# Patient Record
Sex: Female | Born: 1950 | Race: Black or African American | Hispanic: No | Marital: Married | Smoking: Never smoker
Health system: Southern US, Community
[De-identification: ages and names within clinical notes are randomized; demographics above are authoritative.]

## PROBLEM LIST (undated history)

## (undated) DIAGNOSIS — G43909 Migraine, unspecified, not intractable, without status migrainosus: Secondary | ICD-10-CM

## (undated) DIAGNOSIS — T4145XA Adverse effect of unspecified anesthetic, initial encounter: Secondary | ICD-10-CM

## (undated) DIAGNOSIS — G459 Transient cerebral ischemic attack, unspecified: Secondary | ICD-10-CM

## (undated) DIAGNOSIS — I10 Essential (primary) hypertension: Secondary | ICD-10-CM

## (undated) DIAGNOSIS — M1990 Unspecified osteoarthritis, unspecified site: Secondary | ICD-10-CM

## (undated) DIAGNOSIS — G4730 Sleep apnea, unspecified: Secondary | ICD-10-CM

## (undated) DIAGNOSIS — Z860100 Personal history of colon polyps, unspecified: Secondary | ICD-10-CM

## (undated) DIAGNOSIS — T8859XA Other complications of anesthesia, initial encounter: Secondary | ICD-10-CM

## (undated) DIAGNOSIS — E119 Type 2 diabetes mellitus without complications: Secondary | ICD-10-CM

## (undated) DIAGNOSIS — K219 Gastro-esophageal reflux disease without esophagitis: Secondary | ICD-10-CM

## (undated) DIAGNOSIS — R569 Unspecified convulsions: Secondary | ICD-10-CM

## (undated) DIAGNOSIS — M2550 Pain in unspecified joint: Secondary | ICD-10-CM

## (undated) DIAGNOSIS — J189 Pneumonia, unspecified organism: Secondary | ICD-10-CM

## (undated) DIAGNOSIS — R42 Dizziness and giddiness: Secondary | ICD-10-CM

## (undated) DIAGNOSIS — J309 Allergic rhinitis, unspecified: Secondary | ICD-10-CM

## (undated) DIAGNOSIS — F419 Anxiety disorder, unspecified: Secondary | ICD-10-CM

## (undated) DIAGNOSIS — R0602 Shortness of breath: Secondary | ICD-10-CM

## (undated) DIAGNOSIS — H269 Unspecified cataract: Secondary | ICD-10-CM

## (undated) DIAGNOSIS — M2540 Effusion, unspecified joint: Secondary | ICD-10-CM

## (undated) DIAGNOSIS — E7800 Pure hypercholesterolemia, unspecified: Secondary | ICD-10-CM

## (undated) DIAGNOSIS — I509 Heart failure, unspecified: Secondary | ICD-10-CM

## (undated) DIAGNOSIS — M51369 Other intervertebral disc degeneration, lumbar region without mention of lumbar back pain or lower extremity pain: Secondary | ICD-10-CM

## (undated) DIAGNOSIS — I639 Cerebral infarction, unspecified: Secondary | ICD-10-CM

## (undated) DIAGNOSIS — Z8669 Personal history of other diseases of the nervous system and sense organs: Secondary | ICD-10-CM

## (undated) DIAGNOSIS — M5136 Other intervertebral disc degeneration, lumbar region: Secondary | ICD-10-CM

## (undated) HISTORY — DX: Cerebral infarction, unspecified: I63.9

## (undated) HISTORY — DX: Transient cerebral ischemic attack, unspecified: G45.9

## (undated) HISTORY — PX: TOTAL KNEE ARTHROPLASTY: SHX125

## (undated) HISTORY — DX: Essential (primary) hypertension: I10

## (undated) HISTORY — PX: ESOPHAGOGASTRODUODENOSCOPY: SHX1529

## (undated) HISTORY — DX: Allergic rhinitis, unspecified: J30.9

## (undated) HISTORY — DX: Type 2 diabetes mellitus without complications: E11.9

## (undated) HISTORY — PX: ROTATOR CUFF REPAIR: SHX139

## (undated) HISTORY — PX: COLONOSCOPY: SHX174

## (undated) HISTORY — DX: Pure hypercholesterolemia, unspecified: E78.00

## (undated) HISTORY — PX: EYE SURGERY: SHX253

## (undated) HISTORY — DX: Migraine, unspecified, not intractable, without status migrainosus: G43.909

## (undated) HISTORY — DX: Gastro-esophageal reflux disease without esophagitis: K21.9

---

## 1985-05-02 HISTORY — PX: HERNIA REPAIR: SHX51

## 1986-05-02 HISTORY — PX: OTHER SURGICAL HISTORY: SHX169

## 1989-05-02 HISTORY — PX: ABDOMINAL HYSTERECTOMY: SHX81

## 1994-05-02 HISTORY — PX: OTHER SURGICAL HISTORY: SHX169

## 1998-02-18 ENCOUNTER — Other Ambulatory Visit: Admission: RE | Admit: 1998-02-18 | Discharge: 1998-02-18 | Payer: Self-pay | Admitting: Obstetrics and Gynecology

## 1999-04-30 ENCOUNTER — Ambulatory Visit (HOSPITAL_COMMUNITY): Admission: RE | Admit: 1999-04-30 | Discharge: 1999-04-30 | Payer: Self-pay | Admitting: Obstetrics and Gynecology

## 1999-04-30 ENCOUNTER — Encounter (INDEPENDENT_AMBULATORY_CARE_PROVIDER_SITE_OTHER): Payer: Self-pay | Admitting: *Deleted

## 1999-12-07 ENCOUNTER — Encounter: Admission: RE | Admit: 1999-12-07 | Discharge: 1999-12-07 | Payer: Self-pay | Admitting: Neurosurgery

## 1999-12-07 ENCOUNTER — Encounter: Payer: Self-pay | Admitting: Neurosurgery

## 2001-03-12 ENCOUNTER — Other Ambulatory Visit: Admission: RE | Admit: 2001-03-12 | Discharge: 2001-03-12 | Payer: Self-pay | Admitting: Obstetrics and Gynecology

## 2001-05-30 ENCOUNTER — Ambulatory Visit (HOSPITAL_COMMUNITY): Admission: RE | Admit: 2001-05-30 | Discharge: 2001-05-30 | Payer: Self-pay | Admitting: Gastroenterology

## 2001-05-30 ENCOUNTER — Encounter (INDEPENDENT_AMBULATORY_CARE_PROVIDER_SITE_OTHER): Payer: Self-pay | Admitting: Specialist

## 2002-06-01 ENCOUNTER — Inpatient Hospital Stay (HOSPITAL_COMMUNITY): Admission: EM | Admit: 2002-06-01 | Discharge: 2002-06-02 | Payer: Self-pay | Admitting: Emergency Medicine

## 2002-09-26 ENCOUNTER — Emergency Department (HOSPITAL_COMMUNITY): Admission: EM | Admit: 2002-09-26 | Discharge: 2002-09-26 | Payer: Self-pay | Admitting: Emergency Medicine

## 2003-05-09 ENCOUNTER — Other Ambulatory Visit: Admission: RE | Admit: 2003-05-09 | Discharge: 2003-05-09 | Payer: Self-pay | Admitting: Obstetrics and Gynecology

## 2005-09-20 ENCOUNTER — Ambulatory Visit (HOSPITAL_COMMUNITY): Admission: RE | Admit: 2005-09-20 | Discharge: 2005-09-20 | Payer: Self-pay | Admitting: *Deleted

## 2006-06-17 ENCOUNTER — Emergency Department: Payer: Self-pay | Admitting: Emergency Medicine

## 2006-07-03 ENCOUNTER — Ambulatory Visit: Payer: Self-pay | Admitting: Cardiovascular Disease

## 2007-10-30 ENCOUNTER — Encounter (INDEPENDENT_AMBULATORY_CARE_PROVIDER_SITE_OTHER): Payer: Self-pay | Admitting: Family Medicine

## 2007-10-30 ENCOUNTER — Ambulatory Visit: Admission: RE | Admit: 2007-10-30 | Discharge: 2007-10-30 | Payer: Self-pay | Admitting: Family Medicine

## 2007-10-30 ENCOUNTER — Ambulatory Visit: Payer: Self-pay | Admitting: Vascular Surgery

## 2008-06-30 ENCOUNTER — Emergency Department (HOSPITAL_COMMUNITY): Admission: EM | Admit: 2008-06-30 | Discharge: 2008-06-30 | Payer: Self-pay | Admitting: Emergency Medicine

## 2009-05-02 HISTORY — PX: KNEE SURGERY: SHX244

## 2010-08-12 LAB — COMPREHENSIVE METABOLIC PANEL
ALT: 48 U/L — ABNORMAL HIGH (ref 0–35)
AST: 40 U/L — ABNORMAL HIGH (ref 0–37)
Albumin: 4 g/dL (ref 3.5–5.2)
Alkaline Phosphatase: 72 U/L (ref 39–117)
BUN: 18 mg/dL (ref 6–23)
CO2: 31 mEq/L (ref 19–32)
Calcium: 9.2 mg/dL (ref 8.4–10.5)
Chloride: 102 mEq/L (ref 96–112)
Creatinine, Ser: 0.96 mg/dL (ref 0.4–1.2)
GFR calc Af Amer: >60 mL/min (ref >60)
GFR calc non Af Amer: 60 mL/min — ABNORMAL LOW (ref >60)
Glucose, Bld: 122 mg/dL — ABNORMAL HIGH (ref 70–99)
Potassium: 3.4 mEq/L — ABNORMAL LOW (ref 3.5–5.1)
Sodium: 139 mEq/L (ref 135–145)
Total Bilirubin: 1.1 mg/dL (ref 0.3–1.2)
Total Protein: 7.4 g/dL (ref 6.0–8.3)

## 2010-08-12 LAB — CBC
HCT: 40.2 % (ref 36.0–46.0)
Hemoglobin: 13.8 g/dL (ref 12.0–15.0)
MCHC: 34.4 g/dL (ref 30.0–36.0)
MCV: 87.4 fL (ref 78.0–100.0)
Platelets: 259 10*3/uL (ref 150–400)
RBC: 4.6 MIL/uL (ref 3.87–5.11)
RDW: 14.5 % (ref 11.5–15.5)
WBC: 4.8 10*3/uL (ref 4.0–10.5)

## 2010-08-12 LAB — DIFFERENTIAL
Basophils Absolute: 0 10*3/uL (ref 0.0–0.1)
Basophils Relative: 0 % (ref 0–1)
Eosinophils Absolute: 0 10*3/uL (ref 0.0–0.7)
Eosinophils Relative: 0 % (ref 0–5)
Lymphocytes Relative: 6 % — ABNORMAL LOW (ref 12–46)
Lymphs Abs: 0.3 10*3/uL — ABNORMAL LOW (ref 0.7–4.0)
Monocytes Absolute: 0.2 10*3/uL (ref 0.1–1.0)
Monocytes Relative: 3 % (ref 3–12)
Neutro Abs: 4.3 10*3/uL (ref 1.7–7.7)
Neutrophils Relative %: 91 % — ABNORMAL HIGH (ref 43–77)

## 2010-08-12 LAB — URINE CULTURE: Colony Count: 35000

## 2010-09-17 NOTE — Assessment & Plan Note (Signed)
[facility]                               LIPID CLINIC NOTE   Robyn Aguilar, Robyn Aguilar             MRN:          [id number]  DATE:07/03/2006                            DOB:          1950-08-03    HISTORY OF PRESENT ILLNESS:  Robyn Aguilar is a pleasant patient  referred to the lipid clinic by Dr. Laurann Montana for evaluation and  medication titration associated with her hyperlipidemia.  This pleasant  60 year old female patient states upon arrival to the office that she  fears statin use and thought for a significant period of time about  canceling this appointment on Friday.  She came to hear what our  recommendation was but is not interested in taking medications at this  time.  The patient states in medication history review that she began to  have muscle ache and pain within 2 weeks after initiating Crestor,  Lipitor and Pravastatin over the past several years.  She states that  she has a very significant fear of use of statin medications.  In  review, she states that she has had no exposure to ubiquinone or CoQ10  during these trials with subsequent muscle pain.  The patient states  very proudly that she can get her total cholesterol down to 220 when she  is exercising and paying attention to her diet.  She had been using the  [facility] diet to use weight for several weeks prior to her last  cholesterol draw.  She proudly states that she lost 12 pounds within 2  weeks; however, she states that this diet probably did increase her  cholesterol because each day she was to have one to two eggs.  The  patient has a negative family history for heart disease per her report.  She has four half-siblings - one half-brother and three half-sisters who  are healthy.  Her father is deceased of unknown causes.  Her mother is  alive with hypertension.  She does have a coronary equivalent as she has  had a history of TIA approximately 4 years ago  resulting in slurred  speech, which resolved.   SOCIAL HISTORY:  She is working in the [employer] school system now  in [employer]  She lives at home.  She is married and she  and her husband live in the [place] area.   PAST MEDICAL HISTORY:  Pertinent for;  1. Hypertension.  2. Hypercholesterolemia.  3. Gastroesophageal reflux disease.  4. Allergic rhinitis.  5. TIA.  6. Migraine.   PAST SURGICAL HISTORY:  Pertinent for:  1. A growth removed from her buttocks; I do not have record of this.  2. Hysterectomy without oophorectomy for fibroids.  3. Right carpal tunnel syndrome surgery.  4. Bilateral hernia repair.  5. Surgical fusion.   The patient has, at the end of the visit, told me that she has had a  heart catheterization performed by Dr. Fraser Din.  I do not have records  of this at this time.  I will request these.   CURRENT MEDICATIONS:  As obtained in our visit include:  1. Estratest H.S. one tablet  daily at bedtime as needed.  2. Aspirin 81 mg one tablet daily.  3. Vitamin E 400 international units daily.  4. Multivitamin daily.  5. Fish oil one capsule daily.  6. Prinizide 20/25 one tablet daily.  7. AcipHex 20 mg daily.   REVIEW OF SYSTEMS:  As is stated in the HPI and otherwise negative.   PHYSICAL EXAMINATION:  Weight today in the office is 154 pounds.  Blood  pressure is 120/78, heart rate is 68.   Labs obtained at Dr. Lucilla Lame office on June 27, 2006, reveal normal  LFTs, a total cholesterol of 347, directly measured LDL of 244, TSH in  limits at 1.17, triglycerides 136, HDL cholesterol 63.   ASSESSMENT:  After a pleasant visit with the patient and review of  outstanding overall diet therapy, it is evident that Mrs. Raul Del-  Pettiford very strongly believes that medications are not indicated at  this time due to in part her belief that exercise is enough, and in part  due to her fear of the HMG-CoA reductase inhibitors.  I spent 50  minutes  with the patient and reviewed the plan of care with Dr. Rollene Rotunda,  who is in agreement with plan as follows:   1. Therapeutic lifestyle changes.  I have recommended to Mrs. Raul Del-      Pettiford that certainly she is well aware of the benefits of      exercise including stress reduction, improved sleep, and reduction      is cholesterol panels.  The patient agrees to get back to using her      [place]elle 30 minutes a day and walking between now and our next      visit.  2. Diet.  The patient will continue to work on dietary intake changes      including reduction in saturated fats and food that are high in      cholesterol.  She has not been eating red meat.  She will continue      this and she will work to continue to select lean cuts of meats and      increase her vegetable and fruit intake.  3. Time frame for therapeutic lifestyle changes alone will be 6 weeks.      I have had a complete discussion with the patient regarding the use      of non-statin-drug therapy to reduce the patient's overall risks      associated with her elevated cholesterol.  The patient reluctantly      appears accepting of Welchol six tablets daily as a nonabsorb      therapy, safe for use in pregnancy, as a potential option.   The patient will call with questions or problems in the meantime.  She  has not accepted a prescription for this agent today or samples.  The  plan is that we will have her check her blood work back in 6 weeks and  see her at that time.  If labs are not at goal we will then move forward  with this therapy.   I appreciate the opportunity to see this kind patient in clinic.  Thank  you for the referral.      Shelby Dubin, PharmD, BCPS, CPP  Electronically Signed      Rollene Rotunda, MD, [facility]  Electronically Signed   MP/MedQ  DD: 07/07/2006  DT: 07/07/2006  Job #: [id number]   cc:   Stacie Acres. Cliffton Asters, M.D.

## 2010-09-17 NOTE — Consult Note (Signed)
NAME:  Robyn Aguilar, Robyn Aguilar              ACCOUNT NO.:  [id number]   MEDICAL RECORD NO.:  [id number]                   PATIENT TYPE:  EMS   LOCATION:  MAJO                                 FACILITY:  [facility]   PHYSICIAN:  Melvyn Novas, M.D.               DATE OF BIRTH:  11/03/1950   DATE OF CONSULTATION:  09/26/2002  DATE OF DISCHARGE:                                   CONSULTATION   HISTORY OF PRESENT ILLNESS:  This lady presented to [facility] ER  after having suffered three days of severe headache, often awakening her in  the middle of the night, mostly left frontal, associated with left facial  numbness, and most pronounced in the maxillary area of her left face, and  with nausea.  The patient had been in general admission to the neurology  service for TIA and is not permitted to take triptans for migraine headache  due to this vascular risk factor.  While her previous admission was for  right-sided facial droop and numbness, she today presents with all symptoms  towards the left side of her face.   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  She is married.  No alcohol.  No nicotine  history.  No drug abuse history.   PAST MEDICAL HISTORY:  1. Hypertension.  2. Gastroesophageal reflux disease.  3. Cervical disk disease with spondylosis.  4. Migraine headache.  5. Transient ischemic attack.   MEDICATIONS:  Aspirin.  Allergy medication.  Maxzide.  Protonix.  Multivitamin.   ALLERGIES:  She is allergic to CODEINE and responds with severe nausea.   PHYSICAL EXAMINATION:  VITAL SIGNS:  Blood pressure 138/87, heart rate 72,  respiration rate 16, pulse peripherally were palpable, no clubbing, no  cyanosis, no edema.  Not febrile.  NECK:  No neck vein distention, no guarding, no swelling of the facial  structures.  The neck is supple.  There is no bruit.  NEUROLOGIC:  Mental status is alert and  oriented.  No aphasia, ataxia or  dysarthria.  Cranial nerves showed pupils reactive, equal to light and  accommodation.  Extraocular movements fully intact with fluent eye  movements, smooth pursuit in horizontal and vertical plane.  No papilledema  seen.  No facial droop is noticed.  Facial sensory over the right maxillary  area is described as numb, but there is also an area in the mandibular  distribution  close to the mouth that the patient describes as numb as well.  Not involved is, however, the forehead or the auricular region.  A  __________ test is negative, and not lateralized.  Tongue and uvula was  midline.  Motor exam showed equal strength and tone.  Muscle strength was  5/5 bilaterally.  The patient has slight elevation and tone, and this was  already noticed in January in a comparison, and attributed to the spinal  spondylosis.  There is no cogwheeling.  Finger, nose intact.  The deep  tendon reflexes are very brisk but downgoing toes to plantar stimulation, no  clonus.  Sensory is intact to primary and cortical modalities.    ASSESSMENT:  1. Migraine headaches, 345.81.  There is photophobia and nausea component.  2. History of cervical spondylosis with neck pain and cervicogenic headache.  3. Facial numbness either as a feature of migraine or unclear if a sinus     component.  The distribution is not typical for a central event, and a     TIA is therefore not likely.   PLAN:  1. Since the patient can not have triptans, we will treat her with IV     Reglan, steroids and non-narcotic pain medication.  2. Her blood pressure is within normal limits, and I feel safe to have her     discharged once her pain is decreased.  We might consider a different     migraine prevention medication such as topiramate or a beta-blocker for     the patient to prevent further exacerbations in the future.                                               Melvyn Novas, M.D.    CD/MEDQ   D:  09/26/2002  T:  09/27/2002  Job:  [id number]   cc:   [facility]   Stacie Acres. White, M.D.  [address]  [place]  [place] [address]  Fax: [phone]

## 2010-09-17 NOTE — Cardiovascular Report (Signed)
Robyn Aguilar, CAPRI NO.:  [id number]   MEDICAL RECORD NO.:  [id number]          PATIENT TYPE:  OIB   LOCATION:  2899                         FACILITY:  [facility]   PHYSICIAN:  Meade Maw, M.D.    DATE OF BIRTH:  1951-02-01   DATE OF PROCEDURE:  09/20/2005  DATE OF DISCHARGE:                              CARDIAC CATHETERIZATION   INDICATION FOR STUDY:  A 59 year old female with increasing dyspnea.   PROCEDURE:  After obtaining written informed consent the patient was brought  to the cardiac catheterization lab in the post absorptive state.  Preop  sedation was achieved using Versed 2 mg IV and Fentanyl 50 mcg IV.  The  right groin was prepped and draped in the usual sterile fashion.  Local  anesthesia was achieved using 1% Xylocaine.  A 6-French hemostasis sheath  was placed into the right femoral artery using the modified Seldinger  technique.  Selective coronary angiography was performed using a JL-4 and a  JR-4 nontorque catheter.  A single-plane ventriculogram was performed in the  RAO position using a 6-French pigtail curved catheter.  All catheter  exchanges were made over a guidewire.  Hemostasis sheath was flushed after  each catheter exchange.  There was no immediate complications.  There was no  identifiable coronary artery disease.  The patient was transferred to  holding area.  Hemostasis was achieved using digital pressure.   FINDINGS:  Aortic pressure is 158/96, LV pressure is 167/10.  EDP is 17.  Fluoroscopy reveals no significant calcification.  Single-plane  ventriculogram reveals normal wall motion ejection fraction of 65%.   CORONARY ANGIOGRAPHY:  Left main coronary artery bifurcates into the left  anterior descending and circumflex vessel.  There was no disease noted in  the left main coronary artery.   LEFT ANTERIOR DESCENDING:  Left anterior descending gives rise to an early D-  1 which bifurcates.  A D-2 goes on an as an apical branch.   There was no  disease in the LAD or its branches.   CIRCUMFLEX VESSEL:  Circumflex vessel is a large caliber vessel which gives  rise to a trivial OM-1, a large bifurcating OM-2 large OM-3 and thus a small  AV groove vessel.  There was no disease in the circumflex or its branches.   RIGHT CORONARY ARTERY:  Right coronary artery is a moderate size artery  dominant for the posterior circulation, gives rise to trivial RV marginals,  a large PDA and a moderate sized PL branch.  There is no disease noted in  the right coronary artery or its branches.   FINAL IMPRESSION:  Normal coronary angiography.  Normal single-plane  ventriculogram.  Stress test was felt to be a false positive.  Dyspnea may  be more related to deconditioning.  The patient does have a slight elevation  in her end-diastolic pressure suggesting some diastolic dysfunction.  Would  recommend aggressive control of her hypertension as well.      Meade Maw, M.D.  Electronically Signed     HP/MEDQ  D:  09/20/2005  T:  09/20/2005  Job:  [id number]   cc:  Stacie Acres Cliffton Asters, M.D.  Fax: [phone]

## 2010-09-17 NOTE — Assessment & Plan Note (Signed)
[facility]                               LIPID CLINIC NOTE   NAME:Robyn Aguilar, [place]             MRN:          [id number]  DATE:08/15/2006                            DOB:          06/16/1950    REFERRING PHYSICIAN:  Stacie Acres. Cliffton Asters, M.D.   A telephone call followup was made to [name] today, April  15 at 1:45 p.m. as she did not come to her lipid appointment yesterday  afternoon, in an attempt to reschedule that.  The patient stated at that  time that she would not be rescheduling and she will follow up with Dr.  Cliffton Asters regarding her lipids.  We will note this and discontinue followup  per her request.  We will be happy to see the patient at any other time  in the future as needed.      Shelby Dubin, PharmD, BCPS, CPP  Electronically Signed      Rollene Rotunda, MD, [facility]  Electronically Signed   MP/MedQ  DD: 08/15/2006  DT: 08/15/2006  Job #: [id number]   cc:   Stacie Acres. Cliffton Asters, M.D.

## 2010-09-17 NOTE — H&P (Signed)
NAME:  Robyn Aguilar, Robyn Aguilar                     ACCOUNT NO.:  [id number]   MEDICAL RECORD NO.:  [id number]                   PATIENT TYPE:  INP   LOCATION:  3035                                 FACILITY:  [facility]   PHYSICIAN:  Melvyn Novas, M.D.               DATE OF BIRTH:  16-Apr-1951   DATE OF ADMISSION:  05/31/2002  DATE OF DISCHARGE:                                HISTORY & PHYSICAL   REASON FOR ADMISSION:  Ms. Robyn Aguilar is admitted on June 01, 2002, to the  floor of [facility] after presenting to  the ER four hours ago with acute facial weakness and numbness.   HISTORY OF PRESENT ILLNESS:  Ms. Robyn Aguilar is a pleasant 60 year old right-  handed African-American female with a history of hypertension, cervical disk  disease, and surgery for disk herniations x2, history of migraine headaches  with visual aura and hypercholesterolemia.  She also has environmental  allergies.   The patient noticed today a sudden onset of right facial numbness at about 8  p.m. and a novocaine-like feeling in her right lower face that lasted four  hours and persisted here in the ER.  She states that she felt that her lower  face was swollen, almost like a dentist had numbed her teeth, and that she  also felt this was weaker or feeling heavier when she spoke.  The patient  did not notice any slurred speech or any problems with expressive or sensory  aphasia.  She also denies any dysphagia.  Seen in the ER by Dr. Read Drivers, he  noticed that there was significant right facial droop but also stated that  the forehead seemed not to be involved.  There is a decrease in the  nasolabial fold noticed over the right side, and a pulsating twitching lower  eyelid.  A finding that the patient herself states was developing as soon as  the numbness started to wear off.  She, however, has not fully recovered her  facial sensation nor her facial strength.   REVIEW OF SYSTEMS:   HEENT:  Eyelid twitching, facial numbness, facial  weakness.  The patient denies impairment in taste, visual acuity,  swallowing, word comprehension, hearing, and noticed no body related  findings such as headaches, nausea and vomiting.  She did blow her nose  right before she had the onset of sensory loss and had noticed some  nosebleed which raised her question if she could have had a hypertensive  episode.  There was no shortness of breath, fever or chills.  No sore  throat, and she received aspirin in the ER.  A CT was negative and she was  sent to the floor to the Neurologist.   MEDICATIONS:  1. Maxzide once a day.  2. Allegra seasonal once a day.  3. Flonase p.r.n.  4. Protonix 40 mg a day.  5. The patient takes multiple vitamins and Gingko Biloba but  she just     started yesterday, raising the question if this could be a possible     response to this nutritional supplement.   ALLERGIES:  She states she has no true medication allergies but is  intolerant of CODEINE due to nausea.   FAMILY HISTORY:  Hypertension, cholesterolemia, and obesity.   SOCIAL HISTORY:  The patient is married.  She works for the [employer].  She has one adult son.  She is gainfully employed, a Recruitment consultant.  Her husband and her son are both at the bedside.  She has never  smoked, she does not drink alcohol.  She has no history of any drug use.   PHYSICAL EXAMINATION:  VITAL SIGNS:  Left arm blood pressure in the ER was  noted at 146/80.  Here upon arrival on the floor the patient has a blood  pressure in the right arm of 124/81.  Repeat blood pressure measurements  left and right show a slight distance of 10 mm _______ in systolic blood  pressures.  Respiratory rate is 16, temperature is 98 Fahrenheit equivalent  to 36 degrees Celsius.  HEENT:  The mucous membranes are well perfused.  There is no sign of  infection or inflammatory changes.  No retinal abnormalities and no  icterus.  NECK:  I cannot detect carotid bruits, bilateral carotid pulses are palpable  as are peripheral pulses in general.  LUNGS:  The patient has clear lungs to auscultation.  HEART:  Regular rhythm and rate.  EXTREMITIES:  There is no edema, mottled skin, or cyanotic abnormalities.  No rash.  NEUROLOGIC EXAM:  Mental status alert and oriented x3.  Very calm, friendly,  and cooperative with a good sense of humor.  A very pleasant patient.  She  noticed no memory or concentration deficits and has shown no apraxia,  aphasia, or dysphagia signs.   Cranial nerves:  Pupils react equal to light and accommodation bilaterally,  direct and consensually.  She has full extraocular movements, conjugated eye  movements.  Denies any diplopia.  There is endpoint nystagmus for movement  in the horizontal plane to both sides with two to three beats which is in  normal limits.  No retinal abnormalities or papilledema are noticed.  Facial  symmetry is disturbed due to a persistent right facial droop and right lower  eyelid fluttering and twitching.  The patient's husband states that this is  already very much improved since she presented to the ER.  Tongue and uvula  are midline.  Neck and facial sensory are still impaired as the patient  reports a difference in sensation to fine touch over the right lower face,  the side of the neck, and radiating up to the mid shoulder.  Her hearing  appears intact and she does not lateralize in ______ test.   Motor:  Equal strength, tone and mass.  No pronator drift.  Deep tendon  reflexes:  1+ bilaterally  in the upper extremities for biceps and triceps,  and antebrachial reflex.  The patellar reflex is bilaterally equal at 1+.  There is an absent Achilles tendon reflex in the right.  The toes are  upgoing bilaterally upon repeat but I believe this is a withdrawal response  in a fairly ticklish person.  Sensory:  Body sensory remains equal except for the area  over neck and  shoulder that is described above.  The palms show ability to differentiate  shapes and objects.  She  has no upper or lower extremity numbness and no  trunk level to any modalities, cortical or primary.   Coordination:  Dysmetria is noticed.  Perhaps there is a touch of an ataxia  when she tries the finger-nose test in her left hand.  The patient resists  touching her nose and develops a very fine  tremor shortly before but she  does not have tremor at rest and no increased tone.  Again, no pronator  drift.  Gait:  Spontaneous gait is well balanced, narrow based, Romberg  negative.   ASSESSMENT:  The patient was admitted under the assumption that this could  be a Bell's palsy.  However, her forehead appears not involved.  In  addition, she has associated facial muscle, motor, and sensory findings, and  does also extend into the neck area making this more likely a trigeminal  involvement in the lower two branches with facial central involvement.  The  left-sided dysmetria as noted above could consist with the injury only if  this is a brainstem event and has affected a cerebellar peduncle or sensory  nucleus.   PLAN:  I will obtain an MRI with MRA of the head and neck today to rule out  any brainstem perfusion problem, and to also look at the posterior fossa.  If the MRI shows in diffusion weighted images no likelihood of any stroke, I  would like the patient still to continue aspirin as she has not taken this  at home before.  If the cervicocranial junction seems to be affected by  degenerative joint disease, I would give a six-day course of steroids.  If  we find evidence of stroke, it is most likely very small vascular stroke and  would still require an antiplatelet therapy as well as the  control of risk factors such as hypercholesterolemia and hypertension.  In  addition, the patient has stated that she had started on the Atkin's diet  and at the same time when she  started to take multiple vitamins.  I will  send for fasting lipid profile.                                               Melvyn Novas, M.D.    CD/MEDQ  D:  06/01/2002  T:  06/01/2002  Job:  [id number]

## 2011-05-12 ENCOUNTER — Other Ambulatory Visit: Payer: Self-pay | Admitting: Family Medicine

## 2011-05-12 DIAGNOSIS — G44009 Cluster headache syndrome, unspecified, not intractable: Secondary | ICD-10-CM

## 2011-05-18 ENCOUNTER — Ambulatory Visit
Admission: RE | Admit: 2011-05-18 | Discharge: 2011-05-18 | Disposition: A | Discharge status: Home / Self Care | Payer: BC Managed Care – PPO | Source: Ambulatory Visit | Attending: Family Medicine | Admitting: Family Medicine

## 2011-05-18 DIAGNOSIS — G44009 Cluster headache syndrome, unspecified, not intractable: Secondary | ICD-10-CM

## 2012-05-02 DIAGNOSIS — I509 Heart failure, unspecified: Secondary | ICD-10-CM

## 2012-05-02 HISTORY — DX: Heart failure, unspecified: I50.9

## 2012-08-25 ENCOUNTER — Emergency Department: Payer: Self-pay | Admitting: Emergency Medicine

## 2012-09-12 ENCOUNTER — Other Ambulatory Visit: Payer: Self-pay | Admitting: Neurology

## 2012-09-12 ENCOUNTER — Ambulatory Visit (INDEPENDENT_AMBULATORY_CARE_PROVIDER_SITE_OTHER): Payer: BC Managed Care – PPO | Admitting: Neurology

## 2012-09-12 ENCOUNTER — Other Ambulatory Visit: Payer: Self-pay | Admitting: *Deleted

## 2012-09-12 ENCOUNTER — Encounter: Payer: Self-pay | Admitting: Neurology

## 2012-09-12 VITALS — BP 141/95 | HR 71 | Temp 97.5°F | Ht 71.0 in | Wt 191.0 lb

## 2012-09-12 DIAGNOSIS — G2581 Restless legs syndrome: Secondary | ICD-10-CM

## 2012-09-12 DIAGNOSIS — G4713 Recurrent hypersomnia: Secondary | ICD-10-CM

## 2012-09-12 DIAGNOSIS — R400 Somnolence: Secondary | ICD-10-CM | POA: Insufficient documentation

## 2012-09-12 DIAGNOSIS — R0683 Snoring: Secondary | ICD-10-CM

## 2012-09-12 DIAGNOSIS — G4710 Hypersomnia, unspecified: Secondary | ICD-10-CM

## 2012-09-12 DIAGNOSIS — G43909 Migraine, unspecified, not intractable, without status migrainosus: Secondary | ICD-10-CM | POA: Insufficient documentation

## 2012-09-12 DIAGNOSIS — R404 Transient alteration of awareness: Secondary | ICD-10-CM

## 2012-09-12 MED ORDER — TOPIRAMATE 25 MG PO TABS: 25.0000 mg | ORAL_TABLET | Freq: Two times a day (BID) | ORAL | Status: DC

## 2012-09-12 MED ORDER — GABAPENTIN 100 MG PO CAPS: 100.0000 mg | ORAL_CAPSULE | Freq: Three times a day (TID) | ORAL | Status: DC

## 2012-09-12 NOTE — Progress Notes (Signed)
[facility]  Provider:  Dr Kumari Sculley Referring Provider: Cala Bradford, MD Primary Care Physician:  Cala Bradford, MD  Chief Complaint  Patient presents with  . New Evaluation    migraines, severe, rm 10    HPI:  Robyn Aguilar is a  right-handed, married, African American 62 y.o. female here as a referral from Dr. Cliffton Asters for intractable migraine. The patient reported that I saw her 10 years ago 2 in a hospital consult when she was admitted with a possible T. I A. on 09/26/2002 the my assessment at that time were neurologic complicated migraines, the patient had a complement of nausea and photophobia. She also had neck pain.  She is now referred by Dr. Cliffton Asters after she presented  With  migrainous headaches again. Dr. Cliffton Asters evaluated her in January 2013 for a cluster headache with sinusitis and this winter of for her migraines.  A CT of the maxillofacial skull was negative for sinusitis.  Her labs were normal. The only remarkable finding was an elevated cholesterol level, a low vitamin D level she had a very slightly decreased level of potassium at 3.4 ,and a slight decrease in her glomerular filtration rate. The patient developed again severe migraines and was seen on 07/20/2012. She had reported falling after the migraine cord" hit her and her eyes became watery and she couldn't see. She is to be picked up from work. She had good relief of frequent migraines by an ascending doors of topiramate every evening finally reaching 100 mg at night. For breakthrough pain she can take a Demerol per Dr. Cliffton Asters prescribed and she has not needed many of those for the last 6 months. She had some residual pain after the day of her fall in the right eye. She stated today that she had no other migraine attacks since March.   She has photophobia, nausea and some dizziness with severe migraines. She reports today that her migraines have again left off she is also coincidentally at this  time on prednisone which may have helped this and presumed allergic component to the migraine origin. Upon questioning her about side effects of topiramate may house the patient indicated that she has noted work finding difficulties. Tingling in the fingers was noted that was transient during the titration period I have asked her to see if she can get by the 75 mg at night instead of 100, and I will add 100 mg Neurontin at night to see if this will help her to sleep better at night  ,as she also complained of insomnia and restless movements.   Review of Systems: Out of a complete 14 system review, the patient complains of only the following symptoms, and all other reviewed systems are negative.   History   Social History  . Marital Status: Married    Spouse Name: N/A    Number of Children: 2  . Years of Education: college   Occupational History  .     Social History Main Topics  . Smoking status: Never Smoker   . Smokeless tobacco: Not on file  . Alcohol Use: No  . Drug Use: No  . Sexually Active: Not on file   Other Topics Concern  . Not on file   Social History Narrative  . No narrative on file    Family History  Problem Relation Age of Onset  . Prostate cancer Other     Past Medical History  Diagnosis Date  . Hypertension   .  Migraine   . High cholesterol   . Stroke     2 mini     Past Surgical History  Procedure Laterality Date  . Abdominal hysterectomy  1992  . Carpel tunnel  1980  . Knee surgery Left 2012    replacement  . Disc surgery  1980    Current Outpatient Prescriptions  Medication Sig Dispense Refill  . gabapentin (NEURONTIN) 100 MG capsule Take 1 capsule (100 mg total) by mouth 3 (three) times daily.  30 capsule  2  . meclizine (ANTIVERT) 25 MG tablet as needed.      [name] MEPERITAB 50 MG tablet Dr . Cliffton Asters . No refills.      . metoprolol succinate (TOPROL-XL) 100 MG 24 hr tablet       . predniSONE (DELTASONE) 10 MG tablet Allergy season,  Dr  Cliffton Asters.      . topiramate (TOPAMAX) 25 MG tablet Take 1 tablet (25 mg total) by mouth 2 (two) times daily.  120 tablet  3  . triamcinolone cream (KENALOG) 0.1 %       . triamterene-hydrochlorothiazide (MAXZIDE-25) 37.5-25 MG per tablet        No current facility-administered medications for this visit.    Allergies as of 09/12/2012  . (Not on File)    Vitals: BP 141/95  Pulse 71  Temp(Src) 97.5 F (36.4 C) (Oral)  Ht 5\' 11"  (1.803 m)  Wt 191 lb (86.637 kg)  BMI 26.65 kg/m2 Last Weight:  Wt Readings from Last 1 Encounters:  09/12/12 191 lb (86.637 kg)   Last Height:   Ht Readings from Last 1 Encounters:  09/12/12 5\' 11"  (1.803 m)   Vision Screening:  Vitals  Physical exam:  General: The patient is awake, alert and appears not in acute distress. The patient is well groomed. Head: Normocephalic, atraumatic. Neck is supple. Mallampati 3 neck circumference:14.5 . Cardiovascular:  Regular rate and rhythm, without  murmurs or carotid bruit, and without distended neck veins. Respiratory: Lungs are clear to auscultation. Skin:  Without evidence of edema, or rash The patient has an anterior scar from thyroid surgery on her neck.   Neurologic exam : The patient is awake and alert, oriented to place and time.  Memory subjective described as intact, yet she had trouble in cn conversation with finding words.   There is a normal attention span & concentration ability. Speech is fluent without dysarthria, dysphonia . Mood and affect are appropriate.  Cranial nerves: Pupils are equal and briskly reactive to light. Funduscopic exam without   evidence of pallor or edema. Extraocular movements  in vertical and horizontal planes intact but  With end point  nystagmus. Visual fields by finger perimetry are intact. Hearing to finger rub intact.  Facial sensation intact to fine touch. Facial motor strength is symmetric and tongue and uvula move midline.  Motor exam:   Normal tone and normal  muscle bulk and symmetric normal strength in all extremities.  Sensory:  Fine touch, pinprick and vibration were tested in all extremities. Proprioception is tested in the upper extremities only. This was  normal.  Coordination: Rapid alternating movements in the fingers/hands is tested and normal. Finger-to-nose maneuver tested and normal without evidence of ataxia, dysmetria or tremor.  Gait and station: Patient walks without assistive device and is able and assisted stool climb up to the exam table. Strength within normal limits. Stance is stable and normal.Steps are unfragmented.  Deep tendon reflexes: in the  upper and lower extremities  are  Present , her  left patella reflex was very brisk, she is status post knee replacement in NOV 2011.. Babinski maneuver response is downgoing.   Assessment:  After physical and neurologic examination, review of laboratory studies, imaging, neurophysiology testing and pre-existing records, assessment will be reviewed on the problem list.  Plan:  Treatment plan and additional workup will be reviewed under Problem List.   This patient has a ten-year history of neurologic migraines that appear not chronically but recurrent and paroxysmal. She is doing well in terms of migraine reduction on topiramate for tablets of 25 mg nightly. However work finding difficulties make it harder in her doctor function. I recommended a very slight reduction to 75 mg at night.  I will add 100 mg of Neurontin for headache prophylaxis as well as for the presumed restless leg syndrome.   I have a low index of suspicion for sleep apnea in this patient with a very slender neck, her husband reported that she snores but not nightly and only when very tired. And when she sits in supine position. I thought that the Neurontin will also work for the insomnia complement as well as helping with headache prophylaxis. I reviewed her lab reports from March of this year, and see no  contraindication.  The patient is sleepy during the days than she usually is, which could be attributed to medication, orders and nocturnal insomnia, or is snoring and Mallampati grade 3.a HST will be ordered to screen for apnea, whoch may contribute to migraines , too.

## 2012-09-12 NOTE — Patient Instructions (Addendum)
Migraine Headache A migraine headache is very bad, throbbing pain on one or both sides of your head. Talk to your doctor about what things may bring on (trigger) your migraine headaches. HOME CARE  Only take medicines as told by your doctor.  Lie down in a dark, quiet room when you have a migraine.  Keep a journal to find out if certain things bring on migraine headaches. For example, write down:  What you eat and drink.  How much sleep you get.  Any change to your diet or medicines.  Lessen how much alcohol you drink.  Quit smoking if you smoke.  Get enough sleep.  Lessen any stress in your life.  Keep lights dim if bright lights bother you or make your migraines worse. GET HELP RIGHT AWAY IF:   Your migraine becomes really bad.  You have a fever.  You have a stiff neck.  You have trouble seeing.  Your muscles are weak, or you lose muscle control.  You lose your balance or have trouble walking.  You feel like you will pass out (faint), or you pass out.  You have really bad symptoms that are different than your first symptoms. MAKE SURE YOU:   Understand these instructions.  Will watch your condition.  Will get help right away if you are not doing well or get worse. Document Released: 01/26/2008 Document Revised: 07/11/2011 Document Reviewed: 04/08/2011 [facility] Patient Information 2013 [place], [place]. Restless Legs Syndrome Restless legs syndrome is a movement disorder. It may also be called a sensori-motor disorder.  CAUSES  No one knows what specifically causes restless legs syndrome, but it tends to run in families. It is also more common in people with low iron, in pregnancy, in people who need dialysis, and those with nerve damage (neuropathy).Some medications may make restless legs syndrome worse.Those medications include drugs to treat high blood pressure, some heart conditions, nausea, colds, allergies, and depression. SYMPTOMS Symptoms include  uncomfortable sensations in the legs. These leg sensations are worse during periods of inactivity or rest. They are also worse while sitting or lying down. Individuals that have the disorder describe sensations in the legs that feel like:  Pulling.  Drawing.  Crawling.  Worming.  Boring.  Tingling.  Pins and needles.  Prickling.  Pain. The sensations are usually accompanied by an overwhelming urge to move the legs. Sudden muscle jerks may also occur. Movement provides temporary relief from the discomfort. In rare cases, the arms may also be affected. Symptoms may interfere with going to sleep (sleep onset insomnia). Restless legs syndrome may also be related to periodic limb movement disorder (PLMD). PLMD is another more common motor disorder. It also causes interrupted sleep. The symptoms from PLMD usually occur most often when you are awake. TREATMENT  Treatment for restless legs syndrome is symptomatic. This means that the symptoms are treated.   Massage and cold compresses may provide temporary relief.  Walk, stretch, or take a cold or hot bath.  Get regular exercise and a good night's sleep.  Avoid caffeine, alcohol, nicotine, and medications that can make it worse.  Do activities that provide mental stimulation like discussions, needlework, and video games. These may be helpful if you are not able to walk or stretch. Some medications are effective in relieving the symptoms. However, many of these medications have side effects. Ask your caregiver about medications that may help your symptoms. Correcting iron deficiency may improve symptoms for some patients. Document Released: 04/08/2002 Document Revised: 07/11/2011 Document  Reviewed: 07/15/2010 [employer] Patient Information 2013 [place], [place].

## 2012-09-28 ENCOUNTER — Ambulatory Visit (INDEPENDENT_AMBULATORY_CARE_PROVIDER_SITE_OTHER): Payer: BC Managed Care – PPO

## 2012-09-28 DIAGNOSIS — G2581 Restless legs syndrome: Secondary | ICD-10-CM

## 2012-09-28 DIAGNOSIS — G4733 Obstructive sleep apnea (adult) (pediatric): Secondary | ICD-10-CM

## 2012-09-28 DIAGNOSIS — R0683 Snoring: Secondary | ICD-10-CM

## 2012-09-28 DIAGNOSIS — G4710 Hypersomnia, unspecified: Secondary | ICD-10-CM

## 2012-10-11 ENCOUNTER — Telehealth: Payer: Self-pay | Admitting: *Deleted

## 2012-10-11 ENCOUNTER — Encounter: Payer: Self-pay | Admitting: *Deleted

## 2012-10-11 DIAGNOSIS — G4733 Obstructive sleep apnea (adult) (pediatric): Secondary | ICD-10-CM

## 2012-10-11 NOTE — Telephone Encounter (Signed)
Left message for patient regarding sleep study results, asked patient to call me back to discuss results and have questions answered.  Explained that a copy of the sleep study was sent to referring physician and copy of study is coming to them in the mail.  Left more detailed message on mobile number stating moderate OSA and CPAP Titration study was being recommended.  Asked pt to call me back to schedule and discuss results.

## 2012-10-11 NOTE — Telephone Encounter (Signed)
Spoke to patient regarding her sleep study results, went through results with her.  She understands need to return for CPAP Titration, appointment was scheduled for Friday 10/19/2012 at 9:30 PM - patient's husband will be staying with her that night.

## 2012-10-19 ENCOUNTER — Encounter: Payer: Self-pay | Admitting: Neurology

## 2012-10-26 ENCOUNTER — Ambulatory Visit (INDEPENDENT_AMBULATORY_CARE_PROVIDER_SITE_OTHER): Payer: BC Managed Care – PPO | Admitting: Neurology

## 2012-10-26 VITALS — BP 128/78 | HR 59 | Ht 61.0 in | Wt 191.0 lb

## 2012-10-26 DIAGNOSIS — G4733 Obstructive sleep apnea (adult) (pediatric): Secondary | ICD-10-CM

## 2012-11-06 ENCOUNTER — Telehealth: Payer: Self-pay | Admitting: Neurology

## 2012-11-06 DIAGNOSIS — G4733 Obstructive sleep apnea (adult) (pediatric): Secondary | ICD-10-CM

## 2012-11-06 NOTE — Telephone Encounter (Signed)
Patient needs a CPAP at 6 cm with nasal pillow airfit p10 , in small .  Needs Rv within the first 8 week on CPAP.  Left Vm for patient , so she knows a CPAP order is processed.

## 2012-11-07 ENCOUNTER — Encounter: Payer: Self-pay | Admitting: *Deleted

## 2012-11-07 NOTE — Telephone Encounter (Signed)
Orders forwarded to [facility] today.  Patient will receive a copy of sleep study report by mail along with a follow up appointment letter. -sh

## 2013-01-02 ENCOUNTER — Encounter: Payer: Self-pay | Admitting: Neurology

## 2013-01-08 DIAGNOSIS — M25519 Pain in unspecified shoulder: Secondary | ICD-10-CM | POA: Insufficient documentation

## 2013-01-22 ENCOUNTER — Encounter (HOSPITAL_COMMUNITY): Payer: Self-pay | Admitting: Respiratory Therapy

## 2013-01-23 ENCOUNTER — Other Ambulatory Visit: Payer: Self-pay | Admitting: Orthopedic Surgery

## 2013-01-23 ENCOUNTER — Ambulatory Visit: Payer: BC Managed Care – PPO | Admitting: Neurology

## 2013-01-24 NOTE — Pre-Procedure Instructions (Signed)
Robyn Aguilar  01/24/2013   Your procedure is scheduled on:  Mon, Oct 6 @ 8:45 AM  Report to [facility]  2 * 3 at 6:45 AM.  Call this number if you have problems the morning of surgery: [phone]   Remember:   Do not eat food or drink liquids after midnight.   Take these medicines the morning of surgery with A SIP OF WATER: Meclizine(Antiver-if needed),Metoprolol(Toprol),and Rabeprazole(Aciphex)               Stop taking your Aspirin.No Goody's,BC's,Aleve,Ibuprofen,Fish Oil,or any Herbal Medications   Do not wear jewelry, make-up or nail polish.  Do not wear lotions, powders, or perfumes. You may wear deodorant.  Do not shave 48 hours prior to surgery.   Do not bring valuables to the hospital.  [facility] is not responsible                  for any belongings or valuables.               Contacts, dentures or bridgework may not be worn into surgery.  Leave suitcase in the car. After surgery it may be brought to your room.  For patients admitted to the hospital, discharge time is determined by your                treatment team.               Special Instructions: Shower using CHG 2 nights before surgery and the night before surgery.  If you shower the day of surgery use CHG.  Use special wash - you have one bottle of CHG for all showers.  You should use approximately 1/3 of the bottle for each shower.   Please read over the following fact sheets that you were given: Pain Booklet, Coughing and Deep Breathing, Blood Transfusion Information, MRSA Information and Surgical Site Infection Prevention

## 2013-01-25 ENCOUNTER — Encounter (HOSPITAL_COMMUNITY): Payer: Self-pay

## 2013-01-25 ENCOUNTER — Encounter (HOSPITAL_COMMUNITY)
Admission: RE | Admit: 2013-01-25 | Discharge: 2013-01-25 | Disposition: A | Discharge status: Home / Self Care | Payer: BC Managed Care – PPO | Source: Ambulatory Visit | Attending: Orthopedic Surgery | Admitting: Orthopedic Surgery

## 2013-01-25 DIAGNOSIS — Z01812 Encounter for preprocedural laboratory examination: Secondary | ICD-10-CM | POA: Insufficient documentation

## 2013-01-25 DIAGNOSIS — Z01810 Encounter for preprocedural cardiovascular examination: Secondary | ICD-10-CM | POA: Insufficient documentation

## 2013-01-25 DIAGNOSIS — Z01818 Encounter for other preprocedural examination: Secondary | ICD-10-CM | POA: Insufficient documentation

## 2013-01-25 HISTORY — DX: Pneumonia, unspecified organism: J18.9

## 2013-01-25 HISTORY — DX: Dizziness and giddiness: R42

## 2013-01-25 HISTORY — DX: Unspecified convulsions: R56.9

## 2013-01-25 HISTORY — DX: Personal history of other diseases of the nervous system and sense organs: Z86.69

## 2013-01-25 HISTORY — DX: Unspecified cataract: H26.9

## 2013-01-25 HISTORY — DX: Pain in unspecified joint: M25.50

## 2013-01-25 HISTORY — DX: Personal history of colon polyps, unspecified: Z86.0100

## 2013-01-25 HISTORY — DX: Effusion, unspecified joint: M25.40

## 2013-01-25 HISTORY — DX: Unspecified osteoarthritis, unspecified site: M19.90

## 2013-01-25 HISTORY — DX: Sleep apnea, unspecified: G47.30

## 2013-01-25 HISTORY — DX: Personal history of colonic polyps: Z86.010

## 2013-01-25 LAB — COMPREHENSIVE METABOLIC PANEL
ALT: 32 U/L (ref 0–35)
AST: 32 U/L (ref 0–37)
Albumin: 4.3 g/dL (ref 3.5–5.2)
Alkaline Phosphatase: 80 U/L (ref 39–117)
BUN: 16 mg/dL (ref 6–23)
CO2: 27 mEq/L (ref 19–32)
Calcium: 10.1 mg/dL (ref 8.4–10.5)
Chloride: 100 mEq/L (ref 96–112)
Creatinine, Ser: 0.97 mg/dL (ref 0.50–1.10)
GFR calc Af Amer: 71 mL/min — ABNORMAL LOW (ref >90)
GFR calc non Af Amer: 61 mL/min — ABNORMAL LOW (ref >90)
Glucose, Bld: 100 mg/dL — ABNORMAL HIGH (ref 70–99)
Potassium: 3.4 mEq/L — ABNORMAL LOW (ref 3.5–5.1)
Sodium: 140 mEq/L (ref 135–145)
Total Bilirubin: 0.5 mg/dL (ref 0.3–1.2)
Total Protein: 7.8 g/dL (ref 6.0–8.3)

## 2013-01-25 LAB — URINALYSIS, ROUTINE W REFLEX MICROSCOPIC
Bilirubin Urine: NEGATIVE
Glucose, UA: NEGATIVE mg/dL
Hgb urine dipstick: NEGATIVE
Ketones, ur: NEGATIVE mg/dL
Leukocytes, UA: NEGATIVE
Nitrite: NEGATIVE
Protein, ur: NEGATIVE mg/dL
Specific Gravity, Urine: 1.011 (ref 1.005–1.030)
Urobilinogen, UA: 1 mg/dL (ref 0.0–1.0)
pH: 7.5 (ref 5.0–8.0)

## 2013-01-25 LAB — ABO/RH: ABO/RH(D): B POS

## 2013-01-25 LAB — CBC WITH DIFFERENTIAL/PLATELET
Basophils Absolute: 0 10*3/uL (ref 0.0–0.1)
Basophils Relative: 1 % (ref 0–1)
Eosinophils Absolute: 0.1 10*3/uL (ref 0.0–0.7)
Eosinophils Relative: 2 % (ref 0–5)
HCT: 43.3 % (ref 36.0–46.0)
Hemoglobin: 14.4 g/dL (ref 12.0–15.0)
Lymphocytes Relative: 44 % (ref 12–46)
Lymphs Abs: 1.5 10*3/uL (ref 0.7–4.0)
MCH: 27.7 pg (ref 26.0–34.0)
MCHC: 33.3 g/dL (ref 30.0–36.0)
MCV: 83.3 fL (ref 78.0–100.0)
Monocytes Absolute: 0.3 10*3/uL (ref 0.1–1.0)
Monocytes Relative: 10 % (ref 3–12)
Neutro Abs: 1.5 10*3/uL — ABNORMAL LOW (ref 1.7–7.7)
Neutrophils Relative %: 44 % (ref 43–77)
Platelets: 236 10*3/uL (ref 150–400)
RBC: 5.2 MIL/uL — ABNORMAL HIGH (ref 3.87–5.11)
RDW: 15.4 % (ref 11.5–15.5)
WBC: 3.4 10*3/uL — ABNORMAL LOW (ref 4.0–10.5)

## 2013-01-25 LAB — APTT: aPTT: 29 seconds (ref 24–37)

## 2013-01-25 LAB — PROTIME-INR
INR: 0.87 (ref 0.00–1.49)
Prothrombin Time: 11.7 seconds (ref 11.6–15.2)

## 2013-01-25 LAB — SURGICAL PCR SCREEN
MRSA, PCR: NEGATIVE
Staphylococcus aureus: NEGATIVE

## 2013-01-25 MED ORDER — CHLORHEXIDINE GLUCONATE 4 % EX LIQD: 60.0000 mL | Freq: Once | CUTANEOUS | Status: DC

## 2013-01-25 NOTE — Progress Notes (Addendum)
Saw Dr.Nancy Fraser Din in 2007 but no follow up required  Heart cath report in epic from 2007  Stress test and echo done early 2007  Dr.Cynthia White with [facility] is MEd[facility]  Denies EKG or CXR in past yr

## 2013-01-26 LAB — URINE CULTURE: Colony Count: 15000

## 2013-01-26 LAB — TYPE AND SCREEN
ABO/RH(D): B POS
Antibody Screen: NEGATIVE

## 2013-01-28 NOTE — Progress Notes (Signed)
Anesthesia Chart Review:  Patient is a 62 year old female scheduled for right TKA on 02/04/13 by Dr. Sherlean Foot.    History includes non-smoker, hypercholesterolemia, HTN, OSA, obesity, complex migraine versus TIA with normal MRI/MRA '04 (recent neurology follow-up with Dr. Vickey Huger 08/2012), medication related seizure 3 years ago, GERD, cataracts, hysterectomy, hernia repair. PCP is Dr. Laurann Montana.  EKG on 01/25/13 showed SB with sinus arrhythmia, LAD, cannot rule out anterior infarct (age undetermined).  It was not felt significantly changed from her previous tracing on 06/01/02.  Reportedly, she had an echo and abnormal stress test in 2007.  She subsequently underwent cardiac cath on 09/20/05 (Dr. Meade Maw) that showed normal coronaries, EF 65%. Report indicates that she did have slight elevation in her end-diastolic pressures suggesting some diastolic dysfunction.  Aggressive control of HTN was recommended.   Preoperative CXR and labs noted.  If no acute changes then I would anticipate that she could proceed as planned.  Velna Ochs [facility] Short Stay Center/Anesthesiology Phone [phone] 01/28/2013 3:28 PM

## 2013-02-03 MED ORDER — CEFAZOLIN SODIUM-DEXTROSE 2-3 GM-% IV SOLR
2.0000 g | INTRAVENOUS | Status: AC
  Administered 2013-02-04: 2 g via INTRAVENOUS
  Filled 2013-02-03: qty 50

## 2013-02-03 MED ORDER — SODIUM CHLORIDE 0.9 % IV SOLN: INTRAVENOUS | Status: DC

## 2013-02-03 MED ORDER — TRANEXAMIC ACID 100 MG/ML IV SOLN
1000.0000 mg | INTRAVENOUS | Status: AC
  Administered 2013-02-04: 1000 mg via INTRAVENOUS
  Filled 2013-02-03: qty 10

## 2013-02-04 ENCOUNTER — Encounter (HOSPITAL_COMMUNITY): Payer: Self-pay | Admitting: Anesthesiology

## 2013-02-04 ENCOUNTER — Encounter (HOSPITAL_COMMUNITY)
Admission: RE | Disposition: A | Discharge status: Home Health | Payer: Self-pay | Source: Ambulatory Visit | Attending: Orthopedic Surgery

## 2013-02-04 ENCOUNTER — Inpatient Hospital Stay (HOSPITAL_COMMUNITY)
Admission: RE | Admit: 2013-02-04 | Discharge: 2013-02-05 | DRG: 209 | Disposition: A | Discharge status: Home Health | Payer: BC Managed Care – PPO | Source: Ambulatory Visit | Attending: Orthopedic Surgery | Admitting: Orthopedic Surgery

## 2013-02-04 ENCOUNTER — Encounter (HOSPITAL_COMMUNITY): Payer: Self-pay | Admitting: Vascular Surgery

## 2013-02-04 ENCOUNTER — Inpatient Hospital Stay (HOSPITAL_COMMUNITY): Payer: BC Managed Care – PPO | Admitting: Anesthesiology

## 2013-02-04 DIAGNOSIS — Z8042 Family history of malignant neoplasm of prostate: Secondary | ICD-10-CM

## 2013-02-04 DIAGNOSIS — Z96651 Presence of right artificial knee joint: Secondary | ICD-10-CM

## 2013-02-04 DIAGNOSIS — Z860100 Personal history of colon polyps, unspecified: Secondary | ICD-10-CM

## 2013-02-04 DIAGNOSIS — K219 Gastro-esophageal reflux disease without esophagitis: Secondary | ICD-10-CM | POA: Diagnosis present

## 2013-02-04 DIAGNOSIS — D62 Acute posthemorrhagic anemia: Secondary | ICD-10-CM | POA: Diagnosis not present

## 2013-02-04 DIAGNOSIS — M1710 Unilateral primary osteoarthritis, unspecified knee: Principal | ICD-10-CM | POA: Diagnosis present

## 2013-02-04 DIAGNOSIS — E7800 Pure hypercholesterolemia, unspecified: Secondary | ICD-10-CM | POA: Diagnosis present

## 2013-02-04 DIAGNOSIS — G43909 Migraine, unspecified, not intractable, without status migrainosus: Secondary | ICD-10-CM | POA: Diagnosis present

## 2013-02-04 DIAGNOSIS — I10 Essential (primary) hypertension: Secondary | ICD-10-CM | POA: Diagnosis present

## 2013-02-04 DIAGNOSIS — G4730 Sleep apnea, unspecified: Secondary | ICD-10-CM | POA: Diagnosis present

## 2013-02-04 DIAGNOSIS — Z8673 Personal history of transient ischemic attack (TIA), and cerebral infarction without residual deficits: Secondary | ICD-10-CM

## 2013-02-04 DIAGNOSIS — H269 Unspecified cataract: Secondary | ICD-10-CM | POA: Diagnosis present

## 2013-02-04 HISTORY — PX: TOTAL KNEE ARTHROPLASTY: SHX125

## 2013-02-04 SURGERY — ARTHROPLASTY, KNEE, TOTAL
Anesthesia: General | Site: Knee | Laterality: Right | Wound class: Clean

## 2013-02-04 MED ORDER — FLEET ENEMA 7-19 GM/118ML RE ENEM: 1.0000 | ENEMA | Freq: Once | RECTAL | Status: AC | PRN

## 2013-02-04 MED ORDER — DIPHENHYDRAMINE HCL 12.5 MG/5ML PO ELIX: 12.5000 mg | ORAL_SOLUTION | ORAL | Status: DC | PRN

## 2013-02-04 MED ORDER — OXYCODONE HCL 5 MG PO TABS
5.0000 mg | ORAL_TABLET | ORAL | Status: DC | PRN
  Administered 2013-02-05 (×3): 10 mg via ORAL
  Filled 2013-02-04 (×4): qty 2

## 2013-02-04 MED ORDER — METOPROLOL SUCCINATE ER 100 MG PO TB24
100.0000 mg | ORAL_TABLET | Freq: Every day | ORAL | Status: DC
  Administered 2013-02-05: 100 mg via ORAL
  Filled 2013-02-04: qty 1

## 2013-02-04 MED ORDER — CELECOXIB 200 MG PO CAPS
200.0000 mg | ORAL_CAPSULE | Freq: Two times a day (BID) | ORAL | Status: DC
  Administered 2013-02-04 – 2013-02-05 (×3): 200 mg via ORAL
  Filled 2013-02-04 (×5): qty 1

## 2013-02-04 MED ORDER — PROPOFOL 10 MG/ML IV BOLUS
INTRAVENOUS | Status: DC | PRN
  Administered 2013-02-04: 150 mg via INTRAVENOUS

## 2013-02-04 MED ORDER — ONDANSETRON HCL 4 MG/2ML IJ SOLN: 4.0000 mg | Freq: Once | INTRAMUSCULAR | Status: DC | PRN

## 2013-02-04 MED ORDER — GLYCOPYRROLATE 0.2 MG/ML IJ SOLN
INTRAMUSCULAR | Status: DC | PRN
  Administered 2013-02-04: .8 mg via INTRAVENOUS
  Administered 2013-02-04: .2 mg via INTRAVENOUS

## 2013-02-04 MED ORDER — MEPERIDINE HCL 50 MG PO TABS: 50.0000 mg | ORAL_TABLET | ORAL | Status: DC | PRN

## 2013-02-04 MED ORDER — BUPIVACAINE HCL (PF) 0.5 % IJ SOLN
INTRAMUSCULAR | Status: DC | PRN
  Administered 2013-02-04: 10 mL via INTRA_ARTICULAR

## 2013-02-04 MED ORDER — BISACODYL 5 MG PO TBEC: 5.0000 mg | DELAYED_RELEASE_TABLET | Freq: Every day | ORAL | Status: DC | PRN

## 2013-02-04 MED ORDER — MECLIZINE HCL 25 MG PO TABS
25.0000 mg | ORAL_TABLET | ORAL | Status: DC | PRN
  Filled 2013-02-04: qty 1

## 2013-02-04 MED ORDER — LACTATED RINGERS IV SOLN
INTRAVENOUS | Status: DC
  Administered 2013-02-04: 50 mL/h via INTRAVENOUS

## 2013-02-04 MED ORDER — ONDANSETRON HCL 4 MG/2ML IJ SOLN
4.0000 mg | Freq: Four times a day (QID) | INTRAMUSCULAR | Status: DC | PRN
  Administered 2013-02-04 – 2013-02-05 (×3): 4 mg via INTRAVENOUS
  Filled 2013-02-04 (×4): qty 2

## 2013-02-04 MED ORDER — METHOCARBAMOL 500 MG PO TABS
500.0000 mg | ORAL_TABLET | Freq: Four times a day (QID) | ORAL | Status: DC | PRN
  Administered 2013-02-04 – 2013-02-05 (×4): 500 mg via ORAL
  Filled 2013-02-04 (×3): qty 1

## 2013-02-04 MED ORDER — PANTOPRAZOLE SODIUM 40 MG PO TBEC
40.0000 mg | DELAYED_RELEASE_TABLET | Freq: Every day | ORAL | Status: DC
  Filled 2013-02-04: qty 1

## 2013-02-04 MED ORDER — FENTANYL CITRATE 0.05 MG/ML IJ SOLN
INTRAMUSCULAR | Status: AC
  Filled 2013-02-04: qty 2

## 2013-02-04 MED ORDER — LIDOCAINE HCL 4 % MT SOLN
OROMUCOSAL | Status: DC | PRN
  Administered 2013-02-04: 4 mL via TOPICAL

## 2013-02-04 MED ORDER — SODIUM CHLORIDE 0.9 % IR SOLN
Status: DC | PRN
  Administered 2013-02-04: 1000 mL
  Administered 2013-02-04: 3000 mL

## 2013-02-04 MED ORDER — SODIUM CHLORIDE 0.9 % IV SOLN
INTRAVENOUS | Status: DC
  Administered 2013-02-04 – 2013-02-05 (×2): via INTRAVENOUS

## 2013-02-04 MED ORDER — METOCLOPRAMIDE HCL 5 MG PO TABS
5.0000 mg | ORAL_TABLET | Freq: Three times a day (TID) | ORAL | Status: DC | PRN
  Filled 2013-02-04: qty 2

## 2013-02-04 MED ORDER — FENTANYL CITRATE 0.05 MG/ML IJ SOLN
INTRAMUSCULAR | Status: DC | PRN
  Administered 2013-02-04 (×3): 50 ug via INTRAVENOUS
  Administered 2013-02-04: 25 ug via INTRAVENOUS
  Administered 2013-02-04: 50 ug via INTRAVENOUS
  Administered 2013-02-04: 25 ug via INTRAVENOUS
  Administered 2013-02-04 (×3): 50 ug via INTRAVENOUS

## 2013-02-04 MED ORDER — ROCURONIUM BROMIDE 100 MG/10ML IV SOLN
INTRAVENOUS | Status: DC | PRN
  Administered 2013-02-04: 50 mg via INTRAVENOUS

## 2013-02-04 MED ORDER — TOPIRAMATE 25 MG PO TABS
50.0000 mg | ORAL_TABLET | Freq: Every day | ORAL | Status: DC
  Filled 2013-02-04 (×2): qty 2

## 2013-02-04 MED ORDER — HYDROMORPHONE HCL PF 1 MG/ML IJ SOLN
1.0000 mg | INTRAMUSCULAR | Status: DC | PRN
  Administered 2013-02-04: 0.5 mg via INTRAVENOUS
  Administered 2013-02-04: 0.25 mg via INTRAVENOUS
  Administered 2013-02-04 (×2): 1 mg via INTRAVENOUS
  Filled 2013-02-04 (×3): qty 1

## 2013-02-04 MED ORDER — ARTIFICIAL TEARS OP OINT
TOPICAL_OINTMENT | OPHTHALMIC | Status: DC | PRN
  Administered 2013-02-04: 1 via OPHTHALMIC

## 2013-02-04 MED ORDER — LACTATED RINGERS IV SOLN
INTRAVENOUS | Status: DC | PRN
  Administered 2013-02-04 (×3): via INTRAVENOUS

## 2013-02-04 MED ORDER — MENTHOL 3 MG MT LOZG: 1.0000 | LOZENGE | OROMUCOSAL | Status: DC | PRN

## 2013-02-04 MED ORDER — ONDANSETRON HCL 4 MG PO TABS: 4.0000 mg | ORAL_TABLET | Freq: Four times a day (QID) | ORAL | Status: DC | PRN

## 2013-02-04 MED ORDER — DEXAMETHASONE SODIUM PHOSPHATE 10 MG/ML IJ SOLN
INTRAMUSCULAR | Status: DC | PRN
  Administered 2013-02-04: 8 mg via INTRAVENOUS

## 2013-02-04 MED ORDER — ACETAMINOPHEN 650 MG RE SUPP: 650.0000 mg | Freq: Four times a day (QID) | RECTAL | Status: DC | PRN

## 2013-02-04 MED ORDER — SODIUM CHLORIDE 0.9 % IJ SOLN
INTRAMUSCULAR | Status: DC | PRN
  Administered 2013-02-04: 10:00:00

## 2013-02-04 MED ORDER — ZOLPIDEM TARTRATE 5 MG PO TABS: 5.0000 mg | ORAL_TABLET | Freq: Every evening | ORAL | Status: DC | PRN

## 2013-02-04 MED ORDER — LIDOCAINE HCL (CARDIAC) 20 MG/ML IV SOLN
INTRAVENOUS | Status: DC | PRN
  Administered 2013-02-04: 100 mg via INTRAVENOUS

## 2013-02-04 MED ORDER — CEFAZOLIN SODIUM-DEXTROSE 2-3 GM-% IV SOLR
2.0000 g | Freq: Four times a day (QID) | INTRAVENOUS | Status: AC
  Administered 2013-02-04 (×2): 2 g via INTRAVENOUS
  Filled 2013-02-04 (×2): qty 50

## 2013-02-04 MED ORDER — BUPIVACAINE LIPOSOME 1.3 % IJ SUSP
20.0000 mL | Freq: Once | INTRAMUSCULAR | Status: DC
  Filled 2013-02-04 (×2): qty 20

## 2013-02-04 MED ORDER — METHOCARBAMOL 500 MG PO TABS
ORAL_TABLET | ORAL | Status: AC
  Filled 2013-02-04: qty 1

## 2013-02-04 MED ORDER — ACETAMINOPHEN 325 MG PO TABS: 650.0000 mg | ORAL_TABLET | Freq: Four times a day (QID) | ORAL | Status: DC | PRN

## 2013-02-04 MED ORDER — ALUM & MAG HYDROXIDE-SIMETH 200-200-20 MG/5ML PO SUSP: 30.0000 mL | ORAL | Status: DC | PRN

## 2013-02-04 MED ORDER — ONDANSETRON HCL 4 MG/2ML IJ SOLN
INTRAMUSCULAR | Status: DC | PRN
  Administered 2013-02-04: 4 mg via INTRAMUSCULAR
  Administered 2013-02-04: 4 mg via INTRAVENOUS

## 2013-02-04 MED ORDER — MIDAZOLAM HCL 5 MG/5ML IJ SOLN
INTRAMUSCULAR | Status: DC | PRN
  Administered 2013-02-04 (×2): 1 mg via INTRAVENOUS

## 2013-02-04 MED ORDER — NEOSTIGMINE METHYLSULFATE 1 MG/ML IJ SOLN
INTRAMUSCULAR | Status: DC | PRN
  Administered 2013-02-04: 5 mg via INTRAVENOUS

## 2013-02-04 MED ORDER — DOCUSATE SODIUM 100 MG PO CAPS
100.0000 mg | ORAL_CAPSULE | Freq: Two times a day (BID) | ORAL | Status: DC
  Administered 2013-02-04 – 2013-02-05 (×2): 100 mg via ORAL
  Filled 2013-02-04 (×3): qty 1

## 2013-02-04 MED ORDER — METOCLOPRAMIDE HCL 5 MG/ML IJ SOLN
5.0000 mg | Freq: Three times a day (TID) | INTRAMUSCULAR | Status: DC | PRN
  Administered 2013-02-05 (×2): 10 mg via INTRAVENOUS
  Filled 2013-02-04 (×2): qty 2

## 2013-02-04 MED ORDER — HYDROMORPHONE HCL PF 1 MG/ML IJ SOLN
0.2500 mg | INTRAMUSCULAR | Status: DC | PRN
  Administered 2013-02-04: 0.25 mg via INTRAVENOUS

## 2013-02-04 MED ORDER — TRIAMTERENE-HCTZ 37.5-25 MG PO TABS
1.0000 | ORAL_TABLET | Freq: Every day | ORAL | Status: DC
  Administered 2013-02-05: 1 via ORAL
  Filled 2013-02-04: qty 1

## 2013-02-04 MED ORDER — METHOCARBAMOL 100 MG/ML IJ SOLN
500.0000 mg | Freq: Four times a day (QID) | INTRAVENOUS | Status: DC | PRN
  Filled 2013-02-04: qty 5

## 2013-02-04 MED ORDER — PHENOL 1.4 % MT LIQD: 1.0000 | OROMUCOSAL | Status: DC | PRN

## 2013-02-04 MED ORDER — HYDROMORPHONE HCL PF 1 MG/ML IJ SOLN
INTRAMUSCULAR | Status: AC
  Filled 2013-02-04: qty 1

## 2013-02-04 MED ORDER — ENOXAPARIN SODIUM 30 MG/0.3ML ~~LOC~~ SOLN
30.0000 mg | Freq: Two times a day (BID) | SUBCUTANEOUS | Status: DC
  Administered 2013-02-05: 30 mg via SUBCUTANEOUS
  Filled 2013-02-04 (×3): qty 0.3

## 2013-02-04 MED ORDER — OXYCODONE HCL ER 10 MG PO T12A
10.0000 mg | EXTENDED_RELEASE_TABLET | Freq: Two times a day (BID) | ORAL | Status: DC
  Administered 2013-02-04 (×2): 10 mg via ORAL
  Filled 2013-02-04 (×2): qty 1

## 2013-02-04 MED ORDER — SENNOSIDES-DOCUSATE SODIUM 8.6-50 MG PO TABS: 1.0000 | ORAL_TABLET | Freq: Every evening | ORAL | Status: DC | PRN

## 2013-02-04 SURGICAL SUPPLY — 60 items
BANDAGE ESMARK 6X9 LF (GAUZE/BANDAGES/DRESSINGS) ×1 IMPLANT
BLADE SAGITTAL 13X1.27X60 (BLADE) ×2 IMPLANT
BLADE SAW SGTL 83.5X18.5 (BLADE) ×2 IMPLANT
BNDG ESMARK 6X9 LF (GAUZE/BANDAGES/DRESSINGS) ×2
BOWL SMART MIX CTS (DISPOSABLE) ×2 IMPLANT
CAP POR NKTM CP VIT E LN CER ×2 IMPLANT
CEMENT BONE SIMPLEX SPEEDSET (Cement) ×4 IMPLANT
CLOTH BEACON ORANGE TIMEOUT ST (SAFETY) ×2 IMPLANT
COVER SURGICAL LIGHT HANDLE (MISCELLANEOUS) ×2 IMPLANT
CUFF TOURNIQUET SINGLE 34IN LL (TOURNIQUET CUFF) ×2 IMPLANT
DRAPE EXTREMITY T 121X128X90 (DRAPE) ×2 IMPLANT
DRAPE INCISE IOBAN 66X45 STRL (DRAPES) ×4 IMPLANT
DRAPE PROXIMA HALF (DRAPES) ×2 IMPLANT
DRAPE U-SHAPE 47X51 STRL (DRAPES) ×2 IMPLANT
DRSG ADAPTIC 3X8 NADH LF (GAUZE/BANDAGES/DRESSINGS) ×2 IMPLANT
DRSG PAD ABDOMINAL 8X10 ST (GAUZE/BANDAGES/DRESSINGS) ×2 IMPLANT
DURAPREP 26ML APPLICATOR (WOUND CARE) ×4 IMPLANT
ELECT REM PT RETURN 9FT ADLT (ELECTROSURGICAL) ×2
ELECTRODE REM PT RTRN 9FT ADLT (ELECTROSURGICAL) ×1 IMPLANT
EVACUATOR 1/8 PVC DRAIN (DRAIN) ×2 IMPLANT
GLOVE BIOGEL M 7.0 STRL (GLOVE) IMPLANT
GLOVE BIOGEL PI IND STRL 6.5 (GLOVE) ×2 IMPLANT
GLOVE BIOGEL PI IND STRL 7.5 (GLOVE) IMPLANT
GLOVE BIOGEL PI IND STRL 8.5 (GLOVE) ×2 IMPLANT
GLOVE BIOGEL PI INDICATOR 6.5 (GLOVE) ×2
GLOVE BIOGEL PI INDICATOR 7.5 (GLOVE)
GLOVE BIOGEL PI INDICATOR 8.5 (GLOVE) ×2
GLOVE BIOGEL PI ORTHO PRO 7.5 (GLOVE) ×1
GLOVE PI ORTHO PRO STRL 7.5 (GLOVE) ×1 IMPLANT
GLOVE SURG ORTHO 8.0 STRL STRW (GLOVE) ×4 IMPLANT
GLOVE SURG SS PI 7.5 STRL IVOR (GLOVE) ×2 IMPLANT
GOWN PREVENTION PLUS XLARGE (GOWN DISPOSABLE) ×4 IMPLANT
GOWN STRL NON-REIN LRG LVL3 (GOWN DISPOSABLE) ×4 IMPLANT
HANDPIECE INTERPULSE COAX TIP (DISPOSABLE) ×1
HOOD PEEL AWAY FACE SHEILD DIS (HOOD) ×8 IMPLANT
KIT BASIN OR (CUSTOM PROCEDURE TRAY) ×2 IMPLANT
KIT ROOM TURNOVER OR (KITS) ×2 IMPLANT
MANIFOLD NEPTUNE II (INSTRUMENTS) ×2 IMPLANT
NEEDLE 22X1 1/2 (OR ONLY) (NEEDLE) ×2 IMPLANT
NEEDLE HYPO 21X1.5 SAFETY (NEEDLE) ×2 IMPLANT
NS IRRIG 1000ML POUR BTL (IV SOLUTION) ×2 IMPLANT
PACK TOTAL JOINT (CUSTOM PROCEDURE TRAY) ×2 IMPLANT
PAD ARMBOARD 7.5X6 YLW CONV (MISCELLANEOUS) ×4 IMPLANT
PADDING CAST COTTON 6X4 STRL (CAST SUPPLIES) ×2 IMPLANT
POSITIONER HEAD PRONE TRACH (MISCELLANEOUS) ×2 IMPLANT
SET HNDPC FAN SPRY TIP SCT (DISPOSABLE) ×1 IMPLANT
SPONGE GAUZE 4X4 12PLY (GAUZE/BANDAGES/DRESSINGS) ×2 IMPLANT
STAPLER VISISTAT 35W (STAPLE) ×2 IMPLANT
SUCTION FRAZIER TIP 10 FR DISP (SUCTIONS) ×2 IMPLANT
SUT BONE WAX W31G (SUTURE) ×2 IMPLANT
SUT VIC AB 0 CTB1 27 (SUTURE) ×4 IMPLANT
SUT VIC AB 1 CT1 27 (SUTURE) ×2
SUT VIC AB 1 CT1 27XBRD ANBCTR (SUTURE) ×2 IMPLANT
SUT VIC AB 2-0 CT1 27 (SUTURE) ×2
SUT VIC AB 2-0 CT1 TAPERPNT 27 (SUTURE) ×2 IMPLANT
SYR CONTROL 10ML LL (SYRINGE) ×2 IMPLANT
TOWEL OR 17X24 6PK STRL BLUE (TOWEL DISPOSABLE) ×2 IMPLANT
TOWEL OR 17X26 10 PK STRL BLUE (TOWEL DISPOSABLE) ×2 IMPLANT
TRAY FOLEY CATH 16FRSI W/METER (SET/KITS/TRAYS/PACK) ×2 IMPLANT
WATER STERILE IRR 1000ML POUR (IV SOLUTION) ×4 IMPLANT

## 2013-02-04 NOTE — Transfer of Care (Signed)
Immediate Anesthesia Transfer of Care Note  Patient: Robyn Aguilar  Procedure(s) Performed: Procedure(s): RIGHT TOTAL KNEE ARTHROPLASTY (Right)  Patient Location: PACU  Anesthesia Type:General  Level of Consciousness: oriented, sedated and patient cooperative  Airway & Oxygen Therapy: Patient Spontanous Breathing and Patient connected to face mask oxygen  Post-op Assessment: Report given to PACU RN, Post -op Vital signs reviewed and stable and Patient moving all extremities X 4  Post vital signs: Reviewed and stable  Complications: No apparent anesthesia complications

## 2013-02-04 NOTE — Progress Notes (Signed)
Orthopedic Tech Progress Note Patient Details:  Robyn Aguilar October 26, 1950 [id number]  CPM Right Knee CPM Right Knee: On Right Knee Flexion (Degrees): 90 Right Knee Extension (Degrees): 0 Additional Comments: Trapeze bar and foot roll   Cammer, Mickie Bail 02/04/2013, 1:00 PM

## 2013-02-04 NOTE — H&P (Signed)
Robyn Aguilar MRN:  [id number] DOB/SEX:  May 18, 1950/female  CHIEF COMPLAINT:  Painful right Knee  HISTORY: Patient is a 62 y.o. female presented with a history of pain in the right knee. Onset of symptoms was gradual starting several years ago with gradually worsening course since that time. Prior procedures on the knee include none. Patient has been treated conservatively with over-the-counter NSAIDs and activity modification. Patient currently rates pain in the knee at 9 out of 10 with activity. There is pain at night.  PAST MEDICAL HISTORY: Patient Active Problem List   Diagnosis Date Noted  . Migraine, unspecified, without mention of intractable migraine without mention of status migrainosus 09/12/2012  . Intermittent sleepiness 09/12/2012   Past Medical History  Diagnosis Date  . Migraine   . High cholesterol     but doesn't take any meds  . Allergic rhinitis   . TIA (transient ischemic attack)   . Migraines   . Hypertension     takes Metoprolol and maxzide daily  . Sleep apnea     sleep study in epic from 2014-uses CPAP  . Pneumonia     at age 53  . History of migraine     last one 6-51months ago;takes Topamax nightly  . Seizures     hx of;last one 80yrs ago;no meds required;states it was brought on by med;only one ever had  . Dizziness     takes Antivert prn  . Stroke     2 TIA  . Arthritis     knees and neck  . Joint pain   . Joint swelling   . GERD (gastroesophageal reflux disease)     takes Aciphex prn  . History of colon polyps   . Cataracts, bilateral     immature    Past Surgical History  Procedure Laterality Date  . Carpel tunnel Right 1988  . Knee surgery Left 2011    replacement  . Disc surgery  1996  . Hernia repair  1987  . Abdominal hysterectomy  1991  . Colonoscopy    . Esophagogastroduodenoscopy       MEDICATIONS:   No prescriptions prior to admission    ALLERGIES:   Allergies  Allergen Reactions  . Codeine     REVIEW  OF SYSTEMS:  Pertinent items are noted in HPI.   FAMILY HISTORY:   Family History  Problem Relation Age of Onset  . Prostate cancer Other     SOCIAL HISTORY:   History  Substance Use Topics  . Smoking status: Never Smoker   . Smokeless tobacco: Not on file  . Alcohol Use: No     EXAMINATION:  Vital signs in last 24 hours:    General appearance: alert, cooperative and no distress Lungs: clear to auscultation bilaterally Heart: regular rate and rhythm, S1, S2 normal, no murmur, click, rub or gallop Abdomen: soft, non-tender; bowel sounds normal; no masses,  no organomegaly Extremities: Homans sign is negative, no sign of DVT Pulses: 2+ and symmetric Neurologic: Alert and oriented X 3, normal strength and tone. Normal symmetric reflexes. Normal coordination and gait  Musculoskeletal:  ROM 0-100, Ligaments intact,  Imaging Review Plain radiographs demonstrate severe degenerative joint disease of the right knee. The overall alignment is mild valgus. The bone quality appears to be good for age and reported activity level.  Assessment/Plan: End stage arthritis, right knee   The patient history, physical examination and imaging studies are consistent with advanced degenerative joint disease of the right knee. The patient  has failed conservative treatment.  The clearance notes were reviewed.  After discussion with the patient it was felt that Total Knee Replacement was indicated. The procedure,  risks, and benefits of total knee arthroplasty were presented and reviewed. The risks including but not limited to aseptic loosening, infection, blood clots, vascular injury, stiffness, patella tracking problems complications among others were discussed. The patient acknowledged the explanation, agreed to proceed with the plan.  Dvonte Gatliff 02/04/2013, 6:30 AM

## 2013-02-04 NOTE — OR Nursing (Signed)
Pt's BBB resolved. MD and anesthesia aware.

## 2013-02-04 NOTE — Evaluation (Signed)
Physical Therapy Evaluation Patient Details Name: Robyn Aguilar MRN: [id number] DOB: 09-Feb-1951 Today's Date: 02/04/2013 Time: 7829-5621 PT Time Calculation (min): 34 min  PT Assessment / Plan / Recommendation History of Present Illness  62 y.o. female admitted to [facility] on 02/04/13 for elective TKA on the right.  She is WBAT.  No Brace.    Clinical Impression  Pt is POD #0 and is requiring two person assist to transfer to the chair for safety.  She is having more acute abdominal pain than she is reporting post-surgical knee pain.  RN made aware.  I believe that she will progress well with PT when more of her anesthesia and pain medicine is out of her system and I anticipate that HHPT will be needed at discharge.  She has good family support and her husband is physically able to provide assist at discharge.  The pt is moving well and took good weight on both legs during the transfer, she was just very groggy.   PT to follow acutely for deficits listed below.       PT Assessment  Patient needs continued PT services    Follow Up Recommendations  Home health PT;Supervision/Assistance - 24 hour    Does the patient have the potential to tolerate intense rehabilitation     Yes  Barriers to Discharge   None      Equipment Recommendations  None recommended by PT    Recommendations for Other Services   None  Frequency 7X/week    Precautions / Restrictions Precautions Precautions: Knee Restrictions Weight Bearing Restrictions: No RLE Weight Bearing: Weight bearing as tolerated Other Position/Activity Restrictions: no pillow under right knee   Pertinent Vitals/Pain See vitals flow sheet.      Mobility  Bed Mobility Bed Mobility: Supine to Sit;Sitting - Scoot to Edge of Bed Supine to Sit: 4: Min assist;With rails;HOB elevated Sitting - Scoot to [place] of Bed: 4: Min assist;With rail Details for Bed Mobility Assistance: min assist to help progress right leg over the side of the  bed.   Transfers Transfers: Sit to Stand;Stand to Sit;Stand Pivot Transfers Sit to Stand: 1: +2 Total assist;With upper extremity assist;From bed Sit to Stand: Patient Percentage: 70% Stand to Sit: 1: +2 Total assist;With upper extremity assist;With armrests;To chair/3-in-1 Stand to Sit: Patient Percentage: 70% Stand Pivot Transfers: 1: +2 Total assist;With armrests Stand Pivot Transfers: Patient Percentage: 70% Details for Transfer Assistance: Pt able to use upper extremities to support weight as well as putting most of her body weight through her left knee.  Eyes were closed much of the transfer, but she reported that she was able to bear some weight through her right knee and it did not buckle with 2 pivotal steps to the chair on her left side using the RW.  Husband on the other side of her for safety and to increase pt's comfort.   Ambulation/Gait Ambulation/Gait Assistance: Not tested (comment) (pt not alert enough to attempt gait at this time.  )        PT Diagnosis: Difficulty walking;Abnormality of gait;Generalized weakness;Acute pain  PT Problem List: Decreased strength;Decreased range of motion;Decreased activity tolerance;Decreased balance;Decreased mobility;Decreased knowledge of use of DME;Decreased knowledge of precautions;Pain PT Treatment Interventions: DME instruction;Gait training;Stair training;Functional mobility training;Therapeutic activities;Therapeutic exercise;Balance training;Neuromuscular re-education;Patient/family education;Modalities     PT Goals(Current goals can be found in the care plan section) Acute Rehab PT Goals Patient Stated Goal: "Let's do this.  I want to go home" PT Goal Formulation: With patient/family  Time For Goal Achievement: 02/11/13 Potential to Achieve Goals: Good  Visit Information  Last PT Received On: 02/04/13 Assistance Needed: +2 (for safety due to pt very groggy) History of Present Illness: 62 y.o. female admitted to [facility] on 02/04/13  for elective TKA on the right.  She is WBAT.  No Brace.         Prior Functioning  Home Living Family/patient expects to be discharged to:: Private residence Living Arrangements: Spouse/significant other Available Help at Discharge: Family;Available 24 hours/day Type of Home: House Home Access: Stairs to enter [employer] of Steps: 3 Entrance Stairs-Rails: None Home Layout: Two level;1/2 bath on main level Alternate Level Stairs-Number of Steps: flight Alternate Level Stairs-Rails:  (pt is planning on sleeping downstairs) Home Equipment: Walker - 2 wheels;Cane - quad;Bedside commode;Shower seat Additional Comments: pt has CPM at home as well.   Prior Function Level of Independence: Independent Comments: per husband she did not use assistive device PTA.   Communication Communication: No difficulties    Cognition  Cognition Arousal/Alertness: Lethargic;Suspect due to medications Behavior During Therapy: Flat affect Overall Cognitive Status: Difficult to assess Difficult to assess due to: Level of arousal (due to grogginess from pain meds/anesthesia.  )    Extremity/Trunk Assessment Upper Extremity Assessment Upper Extremity Assessment: Overall WFL for tasks assessed Lower Extremity Assessment Lower Extremity Assessment: RLE deficits/detail RLE Deficits / Details: normal post-surgical weakness.  Ankle 4/5, knee 2/5, hip 2+/5 Cervical / Trunk Assessment Cervical / Trunk Assessment: Normal   Balance Balance Balance Assessed: Yes Static Sitting Balance Static Sitting - Balance Support: Bilateral upper extremity supported;Feet supported Static Sitting - Level of Assistance: 5: Stand by assistance Static Standing Balance Static Standing - Balance Support: Bilateral upper extremity supported Static Standing - Level of Assistance: 1: +2 Total assist;Patient percentage (comment) (pt 70%) Dynamic Standing Balance Dynamic Standing - Balance Support: Bilateral upper  extremity supported Dynamic Standing - Level of Assistance: 1: +2 Total assist;Patient percentage (comment) (pt 70%)  End of Session PT - End of Session Equipment Utilized During Treatment: Gait belt Activity Tolerance: Patient limited by fatigue;Patient limited by pain;Patient limited by lethargy Patient left: in chair;with call bell/phone within reach;with family/visitor present Nurse Communication: Mobility status;Other (comment) (sharp pain in R abdomen, +2 needed for safety) CPM Right Knee CPM Right Knee: Off    Breelyn Icard B. Alton Bouknight, PT, DPT [phone]   02/04/2013, 5:49 PM

## 2013-02-04 NOTE — Anesthesia Postprocedure Evaluation (Signed)
  Anesthesia Post-op Note  Patient: Robyn Aguilar  Procedure(s) Performed: Procedure(s): RIGHT TOTAL KNEE ARTHROPLASTY (Right)  Patient Location: PACU  Anesthesia Type:General and GA combined with regional for post-op pain  Level of Consciousness: awake, alert , oriented and patient cooperative  Airway and Oxygen Therapy: Patient Spontanous Breathing  Post-op Pain: mild  Post-op Assessment: Post-op Vital signs reviewed, Patient's Cardiovascular Status Stable, Respiratory Function Stable, Patent Airway, No signs of Nausea or vomiting and Pain level controlled  Post-op Vital Signs: stable  Complications: No apparent anesthesia complications

## 2013-02-04 NOTE — Anesthesia Procedure Notes (Addendum)
Anesthesia Regional Block:  Femoral nerve block  Pre-Anesthetic Checklist: ,, timeout performed, Correct Patient, Correct Site, Correct Laterality, Correct Procedure, Correct Position, site marked, Risks and benefits discussed,  Surgical consent,  Pre-op evaluation,  At surgeon's request and post-op pain management  Laterality: Right  Prep: chloraprep and alcohol swabs       Needles:  Injection technique: Single-shot  Needle Type: Stimulator Needle - 80        Needle insertion depth: 4 cm   Additional Needles:  Procedures: nerve stimulator Femoral nerve block  Nerve Stimulator or Paresthesia:  Response: 0.5 mA, 0.1 ms, 4 cm  Additional Responses:   Narrative:  Start time: 02/04/2013 8:30 AM End time: 02/04/2013 8:35 AM Injection made incrementally with aspirations every 5 mL.  Performed by: Personally  Anesthesiologist: Maren Beach MD  Additional Notes: Pt accepts procedure and risks. 12cc 0.5% Marcaine w/o epi w/o difficulty or discomfort. Pt tolerated well. GES   Procedure Name: Intubation Date/Time: 02/04/2013 8:51 AM Performed by: Sherie Don Pre-anesthesia Checklist: Patient identified, Emergency Drugs available, Suction available, Patient being monitored and Timeout performed Patient Re-evaluated:Patient Re-evaluated prior to inductionOxygen Delivery Method: Circle system utilized Preoxygenation: Pre-oxygenation with 100% oxygen Intubation Type: IV induction Ventilation: Mask ventilation without difficulty Laryngoscope Size: Mac and 3 Grade View: Grade II Tube size: 7.0 mm Number of attempts: 1 Airway Equipment and Method: Stylet and LTA kit utilized Placement Confirmation: ETT inserted through vocal cords under direct vision,  positive ETCO2 and breath sounds checked- equal and bilateral Secured at: 23 cm Tube secured with: Tape Dental Injury: Teeth and Oropharynx as per pre-operative assessment

## 2013-02-04 NOTE — Anesthesia Preprocedure Evaluation (Signed)
Anesthesia Evaluation  Patient identified by MRN, date of birth, ID band Patient awake    Reviewed: Allergy & Precautions, H&P , NPO status , Patient's Chart, lab work & pertinent test results  Airway Mallampati: I TM Distance: >3 FB Neck ROM: full    Dental   Pulmonary sleep apnea , COPD         Cardiovascular hypertension, Rhythm:regular Rate:Normal     Neuro/Psych  Headaches, Seizures -,  TIACVA    GI/Hepatic GERD-  ,  Endo/Other    Renal/GU      Musculoskeletal   Abdominal   Peds  Hematology   Anesthesia Other Findings   Reproductive/Obstetrics                           Anesthesia Physical Anesthesia Plan  ASA: III  Anesthesia Plan: General   Post-op Pain Management:    Induction: Intravenous  Airway Management Planned: Oral ETT  Additional Equipment:   Intra-op Plan:   Post-operative Plan: Extubation in OR  Informed Consent: I have reviewed the patients History and Physical, chart, labs and discussed the procedure including the risks, benefits and alternatives for the proposed anesthesia with the patient or authorized representative who has indicated his/her understanding and acceptance.     Plan Discussed with: CRNA, Anesthesiologist and Surgeon  Anesthesia Plan Comments:         Anesthesia Quick Evaluation

## 2013-02-04 NOTE — Progress Notes (Signed)
Respiratory Therapy  Had CPAP with Auto setting (pt thought her setting was 6cm but not sure) and nasal mask. Patient was going to wear it with her nasal pillows. She said she was only staying one night and asked if she was going to be charged for the use of the CPAP machine. I told her yes. She said she didn't want it. She also said she would sleep with the head of the bed elevated and if she needs to be here for more than one night she will bring hers from home.

## 2013-02-04 NOTE — OR Nursing (Signed)
Pt w/ BBB appearing EKG, differing from what had been observed by CRNA in OR. Anesthesia and MD informed. Ortho PA and Anesthesia at bs. No symptom of instability. Continue to observe.

## 2013-02-04 NOTE — Preoperative (Signed)
Beta Blockers   Reason not to administer Beta Blockers:Toprol tkaen 02/04/13

## 2013-02-05 ENCOUNTER — Other Ambulatory Visit: Payer: Self-pay

## 2013-02-05 ENCOUNTER — Encounter (HOSPITAL_COMMUNITY): Payer: Self-pay | Admitting: Emergency Medicine

## 2013-02-05 DIAGNOSIS — G4733 Obstructive sleep apnea (adult) (pediatric): Secondary | ICD-10-CM | POA: Diagnosis present

## 2013-02-05 DIAGNOSIS — I5033 Acute on chronic diastolic (congestive) heart failure: Principal | ICD-10-CM | POA: Diagnosis present

## 2013-02-05 DIAGNOSIS — J189 Pneumonia, unspecified organism: Secondary | ICD-10-CM | POA: Diagnosis present

## 2013-02-05 DIAGNOSIS — E876 Hypokalemia: Secondary | ICD-10-CM | POA: Diagnosis present

## 2013-02-05 DIAGNOSIS — E785 Hyperlipidemia, unspecified: Secondary | ICD-10-CM | POA: Diagnosis present

## 2013-02-05 DIAGNOSIS — I509 Heart failure, unspecified: Secondary | ICD-10-CM | POA: Diagnosis present

## 2013-02-05 DIAGNOSIS — J9600 Acute respiratory failure, unspecified whether with hypoxia or hypercapnia: Secondary | ICD-10-CM | POA: Diagnosis present

## 2013-02-05 DIAGNOSIS — K5900 Constipation, unspecified: Secondary | ICD-10-CM | POA: Diagnosis present

## 2013-02-05 DIAGNOSIS — M129 Arthropathy, unspecified: Secondary | ICD-10-CM | POA: Diagnosis present

## 2013-02-05 DIAGNOSIS — D62 Acute posthemorrhagic anemia: Secondary | ICD-10-CM | POA: Diagnosis present

## 2013-02-05 DIAGNOSIS — Z8673 Personal history of transient ischemic attack (TIA), and cerebral infarction without residual deficits: Secondary | ICD-10-CM

## 2013-02-05 DIAGNOSIS — T502X5A Adverse effect of carbonic-anhydrase inhibitors, benzothiadiazides and other diuretics, initial encounter: Secondary | ICD-10-CM | POA: Diagnosis present

## 2013-02-05 DIAGNOSIS — Z96659 Presence of unspecified artificial knee joint: Secondary | ICD-10-CM

## 2013-02-05 DIAGNOSIS — Z79899 Other long term (current) drug therapy: Secondary | ICD-10-CM

## 2013-02-05 DIAGNOSIS — G43909 Migraine, unspecified, not intractable, without status migrainosus: Secondary | ICD-10-CM | POA: Diagnosis present

## 2013-02-05 DIAGNOSIS — Z7901 Long term (current) use of anticoagulants: Secondary | ICD-10-CM

## 2013-02-05 DIAGNOSIS — I10 Essential (primary) hypertension: Secondary | ICD-10-CM | POA: Diagnosis present

## 2013-02-05 LAB — BASIC METABOLIC PANEL
BUN: 19 mg/dL (ref 6–23)
CO2: 27 mEq/L (ref 19–32)
Calcium: 8.6 mg/dL (ref 8.4–10.5)
Chloride: 97 mEq/L (ref 96–112)
Creatinine, Ser: 1.08 mg/dL (ref 0.50–1.10)
GFR calc Af Amer: 62 mL/min — ABNORMAL LOW (ref >90)
GFR calc non Af Amer: 53 mL/min — ABNORMAL LOW (ref >90)
Glucose, Bld: 144 mg/dL — ABNORMAL HIGH (ref 70–99)
Potassium: 3.5 mEq/L (ref 3.5–5.1)
Sodium: 135 mEq/L (ref 135–145)

## 2013-02-05 LAB — CBC
HCT: 37.2 % (ref 36.0–46.0)
Hemoglobin: 12.6 g/dL (ref 12.0–15.0)
MCH: 28.1 pg (ref 26.0–34.0)
MCHC: 33.9 g/dL (ref 30.0–36.0)
MCV: 82.9 fL (ref 78.0–100.0)
Platelets: 255 10*3/uL (ref 150–400)
RBC: 4.49 MIL/uL (ref 3.87–5.11)
RDW: 15.4 % (ref 11.5–15.5)
WBC: 7 10*3/uL (ref 4.0–10.5)

## 2013-02-05 MED ORDER — METHOCARBAMOL 500 MG PO TABS: 500.0000 mg | ORAL_TABLET | Freq: Four times a day (QID) | ORAL | Status: DC | PRN

## 2013-02-05 MED ORDER — ENOXAPARIN SODIUM 40 MG/0.4ML ~~LOC~~ SOLN: 40.0000 mg | SUBCUTANEOUS | Status: DC

## 2013-02-05 MED ORDER — ONDANSETRON HCL 4 MG PO TABS: 4.0000 mg | ORAL_TABLET | Freq: Four times a day (QID) | ORAL | Status: DC | PRN

## 2013-02-05 MED ORDER — OXYCODONE HCL 5 MG PO TABS: 5.0000 mg | ORAL_TABLET | ORAL | Status: DC | PRN

## 2013-02-05 NOTE — Progress Notes (Signed)
Physical Therapy Treatment Patient Details Name: Robyn Aguilar MRN: [id number] DOB: 10-May-1950 Today's Date: 02/05/2013 Time: 0922-0956 PT Time Calculation (min): 34 min  PT Assessment / Plan / Recommendation  History of Present Illness 62 y.o. female admitted to [facility] on 02/04/13 for elective TKA on the right.  She is WBAT.  No Brace.     PT Comments   Pt progressing with PT goals.  Increased ambulation distance.  Plan to practice steps next session.    Follow Up Recommendations  Home health PT;Supervision/Assistance - 24 hour     Does the patient have the potential to tolerate intense rehabilitation     Barriers to Discharge        Equipment Recommendations  None recommended by PT    Recommendations for Other Services    Frequency 7X/week   Progress towards PT Goals Progress towards PT goals: Progressing toward goals  Plan Current plan remains appropriate    Precautions / Restrictions Precautions Precautions: Knee Restrictions RLE Weight Bearing: Weight bearing as tolerated   Pertinent Vitals/Pain 4/10 Rt knee.  Premedicated.      Mobility  Bed Mobility Bed Mobility: Supine to Sit;Sitting - Scoot to Edge of Bed Supine to Sit: 4: Min assist;HOB flat;With rails Sitting - Scoot to Edge of Bed: 4: Min guard Details for Bed Mobility Assistance: (A) for RLE to EOB Transfers Transfers: Sit to Stand;Stand to Sit Sit to Stand: 4: Min assist;With upper extremity assist;From bed Stand to Sit: 4: Min assist;With upper extremity assist;With armrests;To chair/3-in-1 Details for Transfer Assistance: cues for safe hand placement & RLE positioning before sitting for comfort Ambulation/Gait Ambulation/Gait Assistance: 4: Min guard Ambulation Distance (Feet): 75 Feet Assistive device: Rolling walker Ambulation/Gait Assistance Details: cues for sequencing & technique.   Gait Pattern: Step-to pattern;Step-through pattern;Decreased weight shift to right;Decreased step length -  left;Decreased hip/knee flexion - right;Antalgic Gait velocity: decreased Stairs: No Wheelchair Mobility Wheelchair Mobility: No    Exercises Total Joint Exercises Ankle Circles/Pumps: AROM;Both;10 reps Quad Sets: AROM;Strengthening;Both;5 reps Straight Leg Raises: AAROM;Strengthening;Right;5 reps;Supine Long Arc Quad: AAROM;Strengthening;Right;5 reps Knee Flexion: AAROM;Right;5 reps;Strengthening     PT Goals (current goals can now be found in the care plan section) Acute Rehab PT Goals PT Goal Formulation: With patient/family Time For Goal Achievement: 02/11/13 Potential to Achieve Goals: Good  Visit Information  Last PT Received On: 02/05/13 Assistance Needed: +1 History of Present Illness: 62 y.o. female admitted to [facility] on 02/04/13 for elective TKA on the right.  She is WBAT.  No Brace.      Subjective Data      Cognition  Cognition Arousal/Alertness: Awake/alert Behavior During Therapy: WFL for tasks assessed/performed Overall Cognitive Status: Within Functional Limits for tasks assessed    Balance     End of Session PT - End of Session Equipment Utilized During Treatment: Gait belt Activity Tolerance: Patient tolerated treatment well Patient left: in chair;with call bell/phone within reach Nurse Communication: Mobility status   GP     Lara Mulch 02/05/2013, 10:02 AM  Verdell Face, PTA [phone] 02/05/2013

## 2013-02-05 NOTE — Progress Notes (Signed)
UR COMPLETED  

## 2013-02-05 NOTE — Op Note (Signed)
TOTAL KNEE REPLACEMENT OPERATIVE NOTE:  02/04/2013  5:49 PM  PATIENT:  Robyn Aguilar  62 y.o. female  PRE-OPERATIVE DIAGNOSIS:  osteoarthritis right knee  POST-OPERATIVE DIAGNOSIS:  osteoarthritis right knee  PROCEDURE:  Procedure(s): RIGHT TOTAL KNEE ARTHROPLASTY  SURGEON:  Surgeon(s): Dannielle Huh, MD  PHYSICIAN ASSISTANT: Altamese Cabal, [facility]  ANESTHESIA:   general  DRAINS: Hemovac and On-Q Marcaine Pain Pump  SPECIMEN: None  COUNTS:  Correct  TOURNIQUET:   Total Tourniquet Time Documented: Thigh (Right) - 59 minutes Total: Thigh (Right) - 59 minutes   DICTATION:  Indication for procedure:    The patient is a 62 y.o. female who has failed conservative treatment for osteoarthritis right knee.  Informed consent was obtained prior to anesthesia. The risks versus benefits of the operation were explain and in a way the patient can, and did, understand.   Description of procedure:     The patient was taken to the operating room and placed under anesthesia.  The patient was positioned in the usual fashion taking care that all body parts were adequately padded and/or protected.  I foley catheter was placed.  A tourniquet was applied and the leg prepped and draped in the usual sterile fashion.  The extremity was exsanguinated with the esmarch and tourniquet inflated to 350 mmHg.  Pre-operative range of motion was normal.  The knee was in 5 degree of mild varus.  A midline incision approximately 6-7 inches long was made with a #10 blade.  A new blade was used to make a parapatellar arthrotomy going 2-3 cm into the quadriceps tendon, over the patella, and alongside the medial aspect of the patellar tendon.  A synovectomy was then performed with the #10 blade and forceps. I then elevated the deep MCL off the medial tibial metaphysis subperiosteally around to the semimembranosus attachment.    I everted the patella and used calipers to measure patellar thickness.  I used  the reamer to ream down to appropriate thickness to recreate the native thickness.  I then removed excess bone with the rongeur and sagittal saw.  I used the appropriately sized template and drilled the three lug holes.  I then put the trial in place and measured the thickness with the calipers to ensure recreation of the native thickness.  The trial was then removed and the patella subluxed and the knee brought into flexion.  A homan retractor was place to retract and protect the patella and lateral structures.  A Z-retractor was place medially to protect the medial structures.  The extra-medullary alignment system was used to make cut the tibial articular surface perpendicular to the anamotic axis of the tibia and in 3 degrees of posterior slope.  The cut surface and alignment jig was removed.  I then used the intramedullary alignment guide to make a 6 valgus cut on the distal femur.  I then marked out the epicondylar axis on the distal femur.  The posterior condylar axis measured 3 degrees.  I then used the anterior referencing sizer and measured the femur to be a size 7.  The 4-In-1 cutting block was screwed into place in external rotation matching the posterior condylar angle, making our cuts perpendicular to the epicondylar axis.  Anterior, posterior and chamfer cuts were made with the sagittal saw.  The cutting block and cut pieces were removed.  A lamina spreader was placed in 90 degrees of flexion.  The ACL, PCL, menisci, and posterior condylar osteophytes were removed.  A 14 mm spacer blocked  was found to offer good flexion and extension gap balance after moderate in degree releasing.   The scoop retractor was then placed and the femoral finishing block was pinned in place.  The small sagittal saw was used as well as the lug drill to finish the femur.  The block and cut surfaces were removed and the medullary canal hole filled with autograft bone from the cut pieces.  The tibia was delivered  forward in deep flexion and external rotation.  A size D tray was selected and pinned into place centered on the medial 1/3 of the tibial tubercle.  The reamer and keel was used to prepare the tibia through the tray.    I then trialed with the size 7 femur, size D tibia, a 14 mm insert and the 32 patella.  I had excellent flexion/extension gap balance, excellent patella tracking.  Flexion was full and beyond 120 degrees; extension was zero.  These components were chosen and the staff opened them to me on the back table while the knee was lavaged copiously and the cement mixed.  The soft tissue was infiltrated with 60cc of exparel 1.3% through a 21 gauge needle.  I cemented in the components and removed all excess cement.  The polyethylene tibial component was snapped into place and the knee placed in extension while cement was hardening.  The capsule was infilltrated with 30cc of .25% Marcaine with epinephrine.  A hemovac was place in the joint exiting superolaterally.  A pain pump was place superomedially superficial to the arthrotomy.  Once the cement was hard, the tourniquet was let down.  Hemostasis was obtained.  The arthrotomy was closed with figure-8 #1 vicryl sutures.  The deep soft tissues were closed with #0 vicryls and the subcuticular layer closed with a running #2-0 vicryl.  The skin was reapproximated and closed with skin staples.  The wound was dressed with xeroform, 4 x4's, 2 ABD sponges, a single layer of webril and a TED stocking.   The patient was then awakened, extubated, and taken to the recovery room in stable condition.  BLOOD LOSS:  300cc DRAINS: 1 hemovac, 1 pain catheter COMPLICATIONS:  None.  PLAN OF CARE: Admit to inpatient   PATIENT DISPOSITION:  PACU - hemodynamically stable.   Delay start of Pharmacological VTE agent (>24hrs) due to surgical blood loss or risk of bleeding:  not applicable  Please fax a copy of this op note to my office at [phone] (please only  include page 1 and 2 of the Case Information op note)

## 2013-02-05 NOTE — Progress Notes (Signed)
Physical Therapy Treatment Patient Details Name: Robyn Aguilar MRN: [id number] DOB: 1950/12/15 Today's Date: 02/05/2013 Time: 1310-1350 PT Time Calculation (min): 40 min  PT Assessment / Plan / Recommendation  History of Present Illness 62 y.o. female admitted to [facility] on 02/04/13 for elective TKA on the right.  She is WBAT.  No Brace.     PT Comments   Pt very drowsy this afternoon.  Having hard time keeping eyes open during session  She states she just received pain medication recently & that makes her drowsy.  Pt's husband present entire session.  Performed stairs this session in case she is to d/c home later today.  Pt moving fairly well despite drowsiness.     Follow Up Recommendations  Home health PT;Supervision/Assistance - 24 hour     Does the patient have the potential to tolerate intense rehabilitation     Barriers to Discharge        Equipment Recommendations  None recommended by PT    Recommendations for Other Services    Frequency 7X/week   Progress towards PT Goals Progress towards PT goals: Progressing toward goals  Plan Current plan remains appropriate    Precautions / Restrictions Precautions Precautions: Knee Restrictions Weight Bearing Restrictions: No RLE Weight Bearing: Weight bearing as tolerated Other Position/Activity Restrictions: no pillow under right knee   Pertinent Vitals/Pain 4/10 Rt knee.      Mobility  Bed Mobility Bed Mobility: Sit to Supine Sit to Supine: 4: Min assist;HOB flat Details for Bed Mobility Assistance: (A) to lift LE's back into bed Transfers Transfers: Sit to Stand;Stand to Sit Sit to Stand: 4: Min guard;With upper extremity assist;With armrests;From chair/3-in-1 Stand to Sit: 4: Min guard;With upper extremity assist;With armrests;To chair/3-in-1;To bed Details for Transfer Assistance: cues for hadn placement Ambulation/Gait Ambulation/Gait Assistance: 4: Min guard Ambulation Distance (Feet): 70 Feet Assistive  device: Rolling walker Ambulation/Gait Assistance Details: Close guarding for safety due to pt very drowsy & having trouble keeping eyes open.  2/2 pain medication Gait Pattern: Step-to pattern;Antalgic;Decreased weight shift to right;Decreased step length - left;Decreased hip/knee flexion - right;Decreased hip/knee flexion - left Gait velocity: decreased General Gait Details: +2 to follow with recliner due to drowsiness.   Stairs: Yes Stairs Assistance: 4: Min guard Stairs Assistance Details (indicate cue type and reason): cues for sequencing & technique.  Pt with decreased hip/knee flexion to clear step when stepping up.  Pt's husband present for training.  He states he feels comfortable with assisting pt up steps.    Stair Management Technique: Two rails;Forwards;Step to pattern Number of Stairs: 2 Wheelchair Mobility Wheelchair Mobility: No      PT Goals (current goals can now be found in the care plan section) Acute Rehab PT Goals Patient Stated Goal: To return home PT Goal Formulation: With patient/family Time For Goal Achievement: 02/11/13 Potential to Achieve Goals: Good  Visit Information  Last PT Received On: 02/05/13 Assistance Needed: +2 (+2 safety due to drowsiness) History of Present Illness: 62 y.o. female admitted to [facility] on 02/04/13 for elective TKA on the right.  She is WBAT.  No Brace.      Subjective Data  Patient Stated Goal: To return home   Cognition  Cognition Arousal/Alertness: Lethargic Behavior During Therapy: Flat affect Overall Cognitive Status: Within Functional Limits for tasks assessed Difficult to assess due to: Level of arousal (due to pain medication)    Balance  Balance Balance Assessed: Yes Dynamic Standing Balance Dynamic Standing - Balance Support: Left upper extremity  supported;During functional activity Dynamic Standing - Level of Assistance: 4: Min assist  End of Session PT - End of Session Equipment Utilized During Treatment: Gait  belt Activity Tolerance: Patient limited by lethargy;Patient tolerated treatment well Patient left: in bed;in CPM;with call bell/phone within reach;with family/visitor present Nurse Communication: Mobility status CPM Right Knee CPM Right Knee: Off   GP     Lara Mulch 02/05/2013, 1:57 PM  Verdell Face, PTA [phone] 02/05/2013

## 2013-02-05 NOTE — Evaluation (Signed)
Occupational Therapy Evaluation Patient Details Name: Audreanna Torrisi MRN: [id number] DOB: 10/08/1950 Today's Date: 02/05/2013 Time: 1132-1203 OT Time Calculation (min): 31 min  OT Assessment / Plan / Recommendation History of present illness 62 y.o. female admitted to [facility] on 02/04/13 for elective TKA on the right.  She is WBAT.  No Brace.     Clinical Impression   Pt demos decline in function with ADLs and ADL mobility and would benefit from acute OT services to address impairments to help restore PLOF to return home safely    OT Assessment  Patient needs continued OT Services    Follow Up Recommendations  Home health OT;Supervision/Assistance - 24 hour    Barriers to Discharge   None  Equipment Recommendations       Recommendations for Other Services    Frequency  Min 2X/week    Precautions / Restrictions Precautions Precautions: Knee Restrictions Weight Bearing Restrictions: No RLE Weight Bearing: Weight bearing as tolerated Other Position/Activity Restrictions: no pillow under right knee   Pertinent Vitals/Pain 4/10    ADL  Grooming: Performed;Wash/dry hands;Wash/dry face;Minimal assistance Where Assessed - Grooming: Supported standing Upper Body Bathing: Simulated;Supervision/safety;Set up Lower Body Bathing: Simulated;Maximal assistance Upper Body Dressing: Performed;Supervision/safety;Set up Lower Body Dressing: +1 Total assistance Toilet Transfer: Performed;Minimal assistance Toilet Transfer Method: Sit to stand Toilet Transfer Equipment: Raised toilet seat with arms (or 3-in-1 over toilet) Toileting - Clothing Manipulation and Hygiene: Performed;Moderate assistance;Maximal assistance Where Assessed - Toileting Clothing Manipulation and Hygiene: Standing Tub/Shower Transfer Method: Not assessed Equipment Used: Long-handled shoe horn;Long-handled sponge;Reacher;Gait belt;Rolling walker;Sock aid;Other (comment) (3 in 1 over toilet) Transfers/Ambulation  Related to ADLs: cues for safe hand placement & RLE positioning before sitting for comfort ADL Comments: pt has DME at home from previous knee surgery, aware of ADL A/E but does not have at home. Reviewed A/E with pt and her husbanc    OT Diagnosis: Acute pain  OT Problem List: Decreased activity tolerance;Pain;Impaired balance (sitting and/or standing) OT Treatment Interventions: Self-care/ADL training;Therapeutic exercise;Patient/family education;Neuromuscular education;Balance training;Therapeutic activities;DME and/or AE instruction   OT Goals(Current goals can be found in the care plan section) Acute Rehab OT Goals Patient Stated Goal: To return home OT Goal Formulation: With patient/family Time For Goal Achievement: 02/12/13 Potential to Achieve Goals: Good ADL Goals Pt Will Perform Grooming: with min guard assist;with supervision;with set-up;standing Pt Will Perform Lower Body Bathing: with mod assist;with caregiver independent in assisting;with adaptive equipment Pt Will Perform Lower Body Dressing: with max assist;with mod assist;with caregiver independent in assisting;with adaptive equipment Pt Will Transfer to Toilet: with min guard assist;regular height toilet (3 in 1 overt toilet) Pt Will Perform Toileting - Clothing Manipulation and hygiene: with min assist;with caregiver independent in assisting;sitting/lateral leans;sit to/from stand Pt Will Perform Tub/Shower Transfer: with min assist;shower seat;tub bench  Visit Information  Last OT Received On: 02/05/13 Assistance Needed: +1 History of Present Illness: 62 y.o. female admitted to [facility] on 02/04/13 for elective TKA on the right.  She is WBAT.  No Brace.         Prior Functioning     Home Living Family/patient expects to be discharged to:: Private residence Living Arrangements: Spouse/significant other Available Help at Discharge: Family;Available 24 hours/day Type of Home: House Home Access: Stairs to enter [employer] of Steps: 3 Entrance Stairs-Rails: None Home Layout: Two level;1/2 bath on main level Alternate Level Stairs-Number of Steps: flight Home Equipment: Walker - 2 wheels;Cane - quad;Bedside commode;Shower seat Additional Comments: pt has CPM at home as  well.   Prior Function Level of Independence: Independent Comments:  did not use assistive device PTA.   Communication Communication: No difficulties Dominant Hand: Right         Vision/Perception Vision - History Baseline Vision: Wears glasses all the time Patient Visual Report: No change from baseline Perception Perception: Within Functional Limits   Cognition  Cognition Arousal/Alertness: Awake/alert Behavior During Therapy: WFL for tasks assessed/performed Overall Cognitive Status: Within Functional Limits for tasks assessed    Extremity/Trunk Assessment Upper Extremity Assessment Upper Extremity Assessment: Overall WFL for tasks assessed Lower Extremity Assessment Lower Extremity Assessment: Defer to PT evaluation Cervical / Trunk Assessment Cervical / Trunk Assessment: Normal     Mobility Bed Mobility Bed Mobility: Not assessed Supine to Sit: 4: Min assist;HOB flat;With rails Sitting - Scoot to Edge of Bed: 4: Min guard Details for Bed Mobility Assistance: pt up in recliner Transfers Sit to Stand: 4: Min assist;With upper extremity assist;From chair/3-in-1;From toilet Stand to Sit: 4: Min assist;With upper extremity assist;With armrests;To chair/3-in-1;To toilet Details for Transfer Assistance: cues for safe hand placement & RLE positioning before sitting for comfort        Balance Balance Balance Assessed: Yes Dynamic Standing Balance Dynamic Standing - Balance Support: Left upper extremity supported;During functional activity Dynamic Standing - Level of Assistance: 4: Min assist   End of Session OT - End of Session Equipment Utilized During Treatment: Gait belt;Rolling walker;Other (comment)  (3 in 1, ADL A/E) Activity Tolerance: Patient tolerated treatment well Patient left: in chair;with call bell/phone within reach;with family/visitor present CPM Right Knee CPM Right Knee: Off  GO     Margaretmary Eddy [facility] 02/05/2013, 12:40 PM

## 2013-02-05 NOTE — Progress Notes (Signed)
SPORTS MEDICINE AND JOINT REPLACEMENT  Georgena Spurling, MD   Altamese Cabal, PA-C [address] [place], [place], [place]  [address]                             [phone]   PROGRESS NOTE  Subjective:  negative for Chest Pain  negative for Shortness of Breath  negative for Nausea/Vomiting   negative for Calf Pain  negative for Bowel Movement   Tolerating Diet: yes         Patient reports pain as 0 on 0-10 scale.    Objective: Vital signs in last 24 hours:   Patient Vitals for the past 24 hrs:  BP Temp Temp src Pulse Resp SpO2  02/05/13 0448 122/72 mmHg 98.4 F (36.9 C) Oral 90 16 97 %  02/05/13 0146 120/70 mmHg 98.7 F (37.1 C) Oral 80 16 98 %  02/04/13 2044 122/66 mmHg 98.2 F (36.8 C) - 83 16 99 %  02/04/13 1315 - 98.5 F (36.9 C) - 69 12 100 %  02/04/13 1300 - - - 70 10 100 %  02/04/13 1245 - - - 66 10 100 %  02/04/13 1230 - - - 64 10 100 %  02/04/13 1215 154/84 mmHg - - 66 10 100 %  02/04/13 1200 151/92 mmHg - - 68 11 100 %  02/04/13 1145 157/92 mmHg - - 69 11 100 %  02/04/13 1130 164/93 mmHg - - 81 17 100 %  02/04/13 1115 153/84 mmHg - - 80 12 100 %  02/04/13 1105 - 97.1 F (36.2 C) - - - -    @flow {1959:LAST@   Intake/Output from previous day:   10/06 0701 - 10/07 0700 In: 3135 [I.V.:3085] Out: 2125 [Urine:1650; Drains:375]   Intake/Output this shift:       Intake/Output     10/06 0701 - 10/07 0700 10/07 0701 - 10/08 0700   I.V. 2536    IV Piggyback 50    Total Intake 3135     Urine 1650    Drains 375    Blood 100    Total Output 2125     Net +1010             LABORATORY DATA:  Recent Labs  02/05/13 0445  WBC 7.0  HGB 12.6  HCT 37.2  PLT 255    Recent Labs  02/05/13 0445  NA 135  K 3.5  CL 97  CO2 27  BUN 19  CREATININE 1.08  GLUCOSE 144*  CALCIUM 8.6   Lab Results  Component Value Date   INR 0.87 01/25/2013    Examination:  General appearance: alert, cooperative and no distress Extremities: Homans sign is negative, no  sign of DVT  Wound Exam: clean, dry, intact   Drainage:  None: wound tissue dry  Motor Exam: EHL and FHL Intact  Sensory Exam: Deep Peroneal normal   Assessment:    1 Day Post-Op  Procedure(s) (LRB): RIGHT TOTAL KNEE ARTHROPLASTY (Right)  ADDITIONAL DIAGNOSIS:  Active Problems:   * No active hospital problems. *  Acute Blood Loss Anemia   Plan: Physical Therapy as ordered Weight Bearing as Tolerated (WBAT)  DVT Prophylaxis:  Lovenox  DISCHARGE PLAN: Home  DISCHARGE NEEDS: HHPT, CPM, Walker and 3-in-1 comode seat         Avah Bashor 02/05/2013, 10:21 AM

## 2013-02-05 NOTE — ED Notes (Signed)
Pt. reports SOB onset 7 pm this evening , denies chest pain , no cough or congestion , pt. stated she had right knee replacement last Monday by Dr. Sherlean Foot  discharged home today .

## 2013-02-05 NOTE — Progress Notes (Signed)
02/05/13 OT recommended HHOT. Spoke with Zedrick Springsteen from [facility] and set up HHOT. Jacquelynn Cree RN, BSN, CCM  02/05/13 Set up with HHPT with Gordy Clement by MD office. Spoke with patient, no change in d/c plan. [employer] provided CPM, 3N1 and rolling walker. Jacquelynn Cree RN, BSN, CCM

## 2013-02-05 NOTE — Progress Notes (Signed)
02/05/13 Set up with HHPT with Gordy Clement by MD office. Spoke with patient, no change in d/c plan. [employer] provided CPM, 3N1 and rolling walker. Jacquelynn Cree RN, BSN, CCM

## 2013-02-06 ENCOUNTER — Emergency Department (HOSPITAL_COMMUNITY)
Admit: 2013-02-06 | Discharge: 2013-02-06 | Disposition: A | Discharge status: Home / Self Care | Payer: BC Managed Care – PPO | Attending: Emergency Medicine | Admitting: Emergency Medicine

## 2013-02-06 ENCOUNTER — Encounter (HOSPITAL_COMMUNITY): Payer: Self-pay | Admitting: General Practice

## 2013-02-06 ENCOUNTER — Inpatient Hospital Stay (HOSPITAL_COMMUNITY)
Admission: EM | Admit: 2013-02-06 | Discharge: 2013-02-09 | DRG: 544 | Disposition: A | Discharge status: Home / Self Care | Payer: BC Managed Care – PPO | Attending: Internal Medicine | Admitting: Internal Medicine

## 2013-02-06 ENCOUNTER — Emergency Department (HOSPITAL_COMMUNITY): Payer: BC Managed Care – PPO

## 2013-02-06 DIAGNOSIS — Z96651 Presence of right artificial knee joint: Secondary | ICD-10-CM

## 2013-02-06 DIAGNOSIS — I10 Essential (primary) hypertension: Secondary | ICD-10-CM

## 2013-02-06 DIAGNOSIS — G4733 Obstructive sleep apnea (adult) (pediatric): Secondary | ICD-10-CM

## 2013-02-06 DIAGNOSIS — E785 Hyperlipidemia, unspecified: Secondary | ICD-10-CM

## 2013-02-06 DIAGNOSIS — J189 Pneumonia, unspecified organism: Secondary | ICD-10-CM

## 2013-02-06 DIAGNOSIS — R569 Unspecified convulsions: Secondary | ICD-10-CM | POA: Insufficient documentation

## 2013-02-06 DIAGNOSIS — J90 Pleural effusion, not elsewhere classified: Secondary | ICD-10-CM

## 2013-02-06 DIAGNOSIS — J9601 Acute respiratory failure with hypoxia: Secondary | ICD-10-CM

## 2013-02-06 DIAGNOSIS — R0602 Shortness of breath: Secondary | ICD-10-CM

## 2013-02-06 DIAGNOSIS — Z96659 Presence of unspecified artificial knee joint: Secondary | ICD-10-CM

## 2013-02-06 DIAGNOSIS — G43909 Migraine, unspecified, not intractable, without status migrainosus: Secondary | ICD-10-CM

## 2013-02-06 HISTORY — DX: Shortness of breath: R06.02

## 2013-02-06 LAB — CK TOTAL AND CKMB (NOT AT ARMC)
CK, MB: 4.5 ng/mL — ABNORMAL HIGH (ref 0.3–4.0)
CK, MB: 4 ng/mL (ref 0.3–4.0)
CK, MB: 3.1 ng/mL (ref 0.3–4.0)
Relative Index: 1.5 (ref 0.0–2.5)
Relative Index: 1.5 (ref 0.0–2.5)
Relative Index: 1.6 (ref 0.0–2.5)
Total CK: 297 U/L — ABNORMAL HIGH (ref 7–177)
Total CK: 261 U/L — ABNORMAL HIGH (ref 7–177)
Total CK: 196 U/L — ABNORMAL HIGH (ref 7–177)

## 2013-02-06 LAB — CBC WITH DIFFERENTIAL/PLATELET
Basophils Absolute: 0 10*3/uL (ref 0.0–0.1)
Basophils Relative: 0 % (ref 0–1)
Eosinophils Absolute: 0.1 10*3/uL (ref 0.0–0.7)
Eosinophils Relative: 1 % (ref 0–5)
HCT: 37.8 % (ref 36.0–46.0)
Hemoglobin: 13.3 g/dL (ref 12.0–15.0)
Lymphocytes Relative: 10 % — ABNORMAL LOW (ref 12–46)
Lymphs Abs: 0.8 10*3/uL (ref 0.7–4.0)
MCH: 28.5 pg (ref 26.0–34.0)
MCHC: 35.2 g/dL (ref 30.0–36.0)
MCV: 81.1 fL (ref 78.0–100.0)
Monocytes Absolute: 0.7 10*3/uL (ref 0.1–1.0)
Monocytes Relative: 8 % (ref 3–12)
Neutro Abs: 6.9 10*3/uL (ref 1.7–7.7)
Neutrophils Relative %: 82 % — ABNORMAL HIGH (ref 43–77)
Platelets: 225 10*3/uL (ref 150–400)
RBC: 4.66 MIL/uL (ref 3.87–5.11)
RDW: 14.9 % (ref 11.5–15.5)
WBC: 8.5 10*3/uL (ref 4.0–10.5)

## 2013-02-06 LAB — BASIC METABOLIC PANEL
BUN: 13 mg/dL (ref 6–23)
CO2: 27 mEq/L (ref 19–32)
Calcium: 8.7 mg/dL (ref 8.4–10.5)
Chloride: 94 mEq/L — ABNORMAL LOW (ref 96–112)
Creatinine, Ser: 0.87 mg/dL (ref 0.50–1.10)
GFR calc Af Amer: 80 mL/min — ABNORMAL LOW (ref >90)
GFR calc non Af Amer: 69 mL/min — ABNORMAL LOW (ref >90)
Glucose, Bld: 127 mg/dL — ABNORMAL HIGH (ref 70–99)
Potassium: 3.7 mEq/L (ref 3.5–5.1)
Sodium: 132 mEq/L — ABNORMAL LOW (ref 135–145)

## 2013-02-06 LAB — URINALYSIS, ROUTINE W REFLEX MICROSCOPIC
Bilirubin Urine: NEGATIVE
Glucose, UA: NEGATIVE mg/dL
Ketones, ur: NEGATIVE mg/dL
Leukocytes, UA: NEGATIVE
Nitrite: NEGATIVE
Protein, ur: NEGATIVE mg/dL
Specific Gravity, Urine: 1.01 (ref 1.005–1.030)
Urobilinogen, UA: 0.2 mg/dL (ref 0.0–1.0)
pH: 5 (ref 5.0–8.0)

## 2013-02-06 LAB — URINE MICROSCOPIC-ADD ON

## 2013-02-06 LAB — TROPONIN I
Troponin I: <0.3 ng/mL (ref <0.30)
Troponin I: <0.3 ng/mL (ref <0.30)
Troponin I: <0.3 ng/mL (ref <0.30)

## 2013-02-06 LAB — PRO B NATRIURETIC PEPTIDE: Pro B Natriuretic peptide (BNP): 347.7 pg/mL — ABNORMAL HIGH (ref 0–125)

## 2013-02-06 LAB — STREP PNEUMONIAE URINARY ANTIGEN: Strep Pneumo Urinary Antigen: NEGATIVE

## 2013-02-06 LAB — HIV ANTIBODY (ROUTINE TESTING W REFLEX): HIV: NONREACTIVE

## 2013-02-06 MED ORDER — PANTOPRAZOLE SODIUM 40 MG PO TBEC
40.0000 mg | DELAYED_RELEASE_TABLET | Freq: Every day | ORAL | Status: DC
  Filled 2013-02-06: qty 1

## 2013-02-06 MED ORDER — ALBUTEROL SULFATE (5 MG/ML) 0.5% IN NEBU: 2.5000 mg | INHALATION_SOLUTION | Freq: Four times a day (QID) | RESPIRATORY_TRACT | Status: DC

## 2013-02-06 MED ORDER — ONDANSETRON HCL 4 MG/2ML IJ SOLN
4.0000 mg | Freq: Once | INTRAMUSCULAR | Status: AC
  Administered 2013-02-06: 4 mg via INTRAVENOUS
  Filled 2013-02-06: qty 2

## 2013-02-06 MED ORDER — ONDANSETRON HCL 4 MG/2ML IJ SOLN
4.0000 mg | Freq: Four times a day (QID) | INTRAMUSCULAR | Status: DC | PRN
  Administered 2013-02-07 – 2013-02-09 (×3): 4 mg via INTRAVENOUS
  Filled 2013-02-06 (×4): qty 2

## 2013-02-06 MED ORDER — VANCOMYCIN HCL 10 G IV SOLR
1250.0000 mg | Freq: Two times a day (BID) | INTRAVENOUS | Status: DC
  Administered 2013-02-06 – 2013-02-08 (×4): 1250 mg via INTRAVENOUS
  Filled 2013-02-06 (×5): qty 1250

## 2013-02-06 MED ORDER — ENOXAPARIN SODIUM 40 MG/0.4ML ~~LOC~~ SOLN: 40.0000 mg | SUBCUTANEOUS | Status: DC

## 2013-02-06 MED ORDER — SODIUM CHLORIDE 0.9 % IV SOLN: 250.0000 mL | INTRAVENOUS | Status: DC | PRN

## 2013-02-06 MED ORDER — ALBUTEROL SULFATE (5 MG/ML) 0.5% IN NEBU: 2.5000 mg | INHALATION_SOLUTION | RESPIRATORY_TRACT | Status: DC | PRN

## 2013-02-06 MED ORDER — METHOCARBAMOL 500 MG PO TABS
500.0000 mg | ORAL_TABLET | Freq: Four times a day (QID) | ORAL | Status: DC | PRN
  Filled 2013-02-06 (×2): qty 2

## 2013-02-06 MED ORDER — DEXTROSE 5 % IV SOLN
1.0000 g | Freq: Once | INTRAVENOUS | Status: AC
  Administered 2013-02-06: 1 g via INTRAVENOUS
  Filled 2013-02-06: qty 10

## 2013-02-06 MED ORDER — FUROSEMIDE 10 MG/ML IJ SOLN
40.0000 mg | Freq: Once | INTRAMUSCULAR | Status: AC
  Administered 2013-02-06: 40 mg via INTRAVENOUS
  Filled 2013-02-06: qty 4

## 2013-02-06 MED ORDER — ENOXAPARIN SODIUM 40 MG/0.4ML ~~LOC~~ SOLN
40.0000 mg | SUBCUTANEOUS | Status: DC
  Administered 2013-02-06 – 2013-02-09 (×4): 40 mg via SUBCUTANEOUS
  Filled 2013-02-06 (×4): qty 0.4

## 2013-02-06 MED ORDER — METOPROLOL SUCCINATE ER 100 MG PO TB24
100.0000 mg | ORAL_TABLET | Freq: Every day | ORAL | Status: DC
  Filled 2013-02-06: qty 1

## 2013-02-06 MED ORDER — TOPIRAMATE 25 MG PO TABS
50.0000 mg | ORAL_TABLET | Freq: Every day | ORAL | Status: DC
  Administered 2013-02-06 – 2013-02-08 (×3): 50 mg via ORAL
  Filled 2013-02-06 (×4): qty 2

## 2013-02-06 MED ORDER — BISACODYL 5 MG PO TBEC
10.0000 mg | DELAYED_RELEASE_TABLET | Freq: Once | ORAL | Status: AC
  Administered 2013-02-06: 10 mg via ORAL

## 2013-02-06 MED ORDER — ALBUTEROL SULFATE (5 MG/ML) 0.5% IN NEBU
5.0000 mg | INHALATION_SOLUTION | Freq: Once | RESPIRATORY_TRACT | Status: DC
  Filled 2013-02-06: qty 1

## 2013-02-06 MED ORDER — VITAMIN E 180 MG (400 UNIT) PO CAPS
400.0000 [IU] | ORAL_CAPSULE | Freq: Every day | ORAL | Status: DC
  Filled 2013-02-06 (×4): qty 1

## 2013-02-06 MED ORDER — VITAMIN D3 25 MCG (1000 UNIT) PO TABS
2000.0000 [IU] | ORAL_TABLET | Freq: Every day | ORAL | Status: DC
  Administered 2013-02-07 – 2013-02-09 (×3): 2000 [IU] via ORAL
  Filled 2013-02-06 (×4): qty 2

## 2013-02-06 MED ORDER — BISACODYL 10 MG RE SUPP: 10.0000 mg | Freq: Once | RECTAL | Status: DC

## 2013-02-06 MED ORDER — HYDROMORPHONE HCL PF 1 MG/ML IJ SOLN
1.0000 mg | Freq: Once | INTRAMUSCULAR | Status: DC
  Filled 2013-02-06 (×2): qty 1

## 2013-02-06 MED ORDER — POLYETHYLENE GLYCOL 3350 17 G PO PACK
17.0000 g | PACK | Freq: Every day | ORAL | Status: DC
  Administered 2013-02-06 – 2013-02-09 (×4): 17 g via ORAL
  Filled 2013-02-06 (×4): qty 1

## 2013-02-06 MED ORDER — IOHEXOL 350 MG/ML SOLN
100.0000 mL | Freq: Once | INTRAVENOUS | Status: AC | PRN
  Administered 2013-02-06: 71 mL via INTRAVENOUS

## 2013-02-06 MED ORDER — HYDROMORPHONE HCL PF 1 MG/ML IJ SOLN
1.0000 mg | INTRAMUSCULAR | Status: DC | PRN
  Administered 2013-02-06 – 2013-02-09 (×6): 1 mg via INTRAVENOUS
  Filled 2013-02-06 (×6): qty 1

## 2013-02-06 MED ORDER — METOPROLOL SUCCINATE ER 50 MG PO TB24
50.0000 mg | ORAL_TABLET | Freq: Every day | ORAL | Status: DC
  Administered 2013-02-06 – 2013-02-08 (×3): 50 mg via ORAL
  Filled 2013-02-06 (×5): qty 1

## 2013-02-06 MED ORDER — SODIUM CHLORIDE 0.9 % IJ SOLN
3.0000 mL | Freq: Two times a day (BID) | INTRAMUSCULAR | Status: DC
  Administered 2013-02-06 – 2013-02-09 (×5): 3 mL via INTRAVENOUS

## 2013-02-06 MED ORDER — ALBUTEROL SULFATE (5 MG/ML) 0.5% IN NEBU
2.5000 mg | INHALATION_SOLUTION | Freq: Two times a day (BID) | RESPIRATORY_TRACT | Status: DC
  Administered 2013-02-06 – 2013-02-09 (×6): 2.5 mg via RESPIRATORY_TRACT
  Filled 2013-02-06 (×6): qty 0.5

## 2013-02-06 MED ORDER — MECLIZINE HCL 25 MG PO TABS
25.0000 mg | ORAL_TABLET | ORAL | Status: DC | PRN
  Filled 2013-02-06: qty 1

## 2013-02-06 MED ORDER — OXYCODONE HCL 5 MG PO TABS: 5.0000 mg | ORAL_TABLET | ORAL | Status: DC | PRN

## 2013-02-06 MED ORDER — AZITHROMYCIN 250 MG PO TABS
500.0000 mg | ORAL_TABLET | Freq: Once | ORAL | Status: AC
  Administered 2013-02-06: 500 mg via ORAL
  Filled 2013-02-06: qty 2

## 2013-02-06 MED ORDER — DOCUSATE SODIUM 100 MG PO CAPS
100.0000 mg | ORAL_CAPSULE | Freq: Two times a day (BID) | ORAL | Status: DC
  Administered 2013-02-06 – 2013-02-09 (×7): 100 mg via ORAL
  Filled 2013-02-06 (×8): qty 1

## 2013-02-06 MED ORDER — DEXTROSE 5 % IV SOLN
1.0000 g | Freq: Three times a day (TID) | INTRAVENOUS | Status: DC
  Administered 2013-02-06 – 2013-02-08 (×6): 1 g via INTRAVENOUS
  Filled 2013-02-06 (×8): qty 1

## 2013-02-06 MED ORDER — ALBUTEROL SULFATE (5 MG/ML) 0.5% IN NEBU
5.0000 mg | INHALATION_SOLUTION | Freq: Once | RESPIRATORY_TRACT | Status: AC
  Administered 2013-02-06: 5 mg via RESPIRATORY_TRACT

## 2013-02-06 MED ORDER — VITAMIN B-12 1000 MCG PO TABS
1000.0000 ug | ORAL_TABLET | Freq: Every day | ORAL | Status: DC
  Filled 2013-02-06 (×4): qty 1

## 2013-02-06 MED ORDER — SODIUM CHLORIDE 0.9 % IJ SOLN: 3.0000 mL | INTRAMUSCULAR | Status: DC | PRN

## 2013-02-06 NOTE — ED Notes (Signed)
Phlebotomy at bedside to complete blood draw.

## 2013-02-06 NOTE — ED Notes (Signed)
Pt alert, NAD, calm, interactive, family at BS, VSS.  °

## 2013-02-06 NOTE — Progress Notes (Signed)
Patient, attempted to have Bowel Movement but was unable to. Patient became short of breath after she was moved up in bed, put patient on Halma at 2 L and then bumped it up to 3 L and patient stated Shortness of breath subsided, patient now resting and no further complaints.

## 2013-02-06 NOTE — Progress Notes (Signed)
ANTIBIOTIC CONSULT NOTE - INITIAL  Pharmacy Consult for vancomycin Indication: pneumonia  Allergies  Allergen Reactions  . Codeine     Patient Measurements: Height: 5\' 5"  (165.1 cm) Weight: 203 lb 1.6 oz (92.126 kg) (scale C) IBW/kg (Calculated) : 57   Vital Signs: Temp: 98.2 F (36.8 C) (10/08 0900) Temp src: Oral (10/08 0900) BP: 104/72 mmHg (10/08 0900) Pulse Rate: 115 (10/08 0900) Intake/Output from previous day:   Intake/Output from this shift: Total I/O In: 3 [I.V.:3] Out: 100 [Urine:100]  Labs:  Recent Labs  02/05/13 0445 02/06/13 0320  WBC 7.0 8.5  HGB 12.6 13.3  PLT 255 225  CREATININE 1.08 0.87   Estimated Creatinine Clearance: 74.2 ml/min (by C-G formula based on Cr of 0.87). No results found for this basename: VANCOTROUGH, Leodis Binet, VANCORANDOM, GENTTROUGH, GENTPEAK, GENTRANDOM, TOBRATROUGH, TOBRAPEAK, TOBRARND, AMIKACINPEAK, AMIKACINTROU, AMIKACIN,  in the last 72 hours   Microbiology: Recent Results (from the past 720 hour(s))  SURGICAL PCR SCREEN     Status: None   Collection Time    01/25/13  9:40 AM      Result Value Range Status   MRSA, PCR NEGATIVE  NEGATIVE Final   Staphylococcus aureus NEGATIVE  NEGATIVE Final   Comment:            The Xpert SA Assay (FDA     approved for NASAL specimens     in patients over 38 years of age),     is one component of     a comprehensive surveillance     program.  Test performance has     been validated by [employer] for patients greater     than or equal to 29 year old.     It is not intended     to diagnose infection nor to     guide or monitor treatment.  URINE CULTURE     Status: None   Collection Time    01/25/13  9:41 AM      Result Value Range Status   Specimen Description URINE, CLEAN CATCH   Final   Special Requests NONE   Final   Culture  Setup Time     Final   Value: 01/25/2013 10:36     Performed at [facility] Count     Final   Value: 15,000 COLONIES/ML      Performed at [facility]   Culture     Final   Value: Multiple bacterial morphotypes present, none predominant. Suggest appropriate recollection if clinically indicated.     Performed at [facility]   Report Status 01/26/2013 FINAL   Final    Medical History: Past Medical History  Diagnosis Date  . Migraine   . High cholesterol     but doesn't take any meds  . Allergic rhinitis   . TIA (transient ischemic attack)   . Migraines   . Hypertension     takes Metoprolol and maxzide daily  . Sleep apnea     sleep study in epic from 2014-uses CPAP  . Pneumonia     at age 62  . History of migraine     last one 6-48months ago;takes Topamax nightly  . Seizures     hx of;last one 63yrs ago;no meds required;states it was brought on by med;only one ever had  . Dizziness     takes Antivert prn  . Stroke     2 TIA  .  Arthritis     knees and neck  . Joint pain   . Joint swelling   . GERD (gastroesophageal reflux disease)     takes Aciphex prn  . History of colon polyps   . Cataracts, bilateral     immature     Assessment: 62 YOF who was just discharged yesterday after having R TKA and came back to the hospital with SOB. No evidence of PE on CT angio. To be started on antibiotics for suspected PNA. SCr 0.87, CrCl ~52mL/min. No antibiotic allergies noted. Pharmacy may also adjust other antibiotics for renal function per MD order.  Goal of Therapy:  Vancomycin trough level 15-20 mcg/ml  Plan:  1. Continue cefepime 1g IV q8h per MD x 8 days 2. Vancomycin 1250mg  IV q12h x8 days 3. Follow c/s, renal function, clinical progression, VT at [facility]  Samuele Storey D. Isebella Upshur, PharmD Clinical Pharmacist Pager: [phone] 02/06/2013 11:07 AM

## 2013-02-06 NOTE — ED Notes (Signed)
Patient refused pain medication and zofran until after she could eat something.  Patient given crackers and gingerale per Bebe Shaggy, MD okay.   Patient stated she wanted to eat before any other meds.

## 2013-02-06 NOTE — ED Provider Notes (Signed)
CSN: [id number]     Arrival date & time 02/05/13  2336 History   First MD Initiated Contact with Patient 02/06/13 [phone]     Chief Complaint  Patient presents with  . Shortness of Breath    Patient is a 62 y.o. female presenting with shortness of breath. The history is provided by the patient.  Shortness of Breath Severity:  Moderate Onset quality:  Sudden Duration:  4 hours Timing:  Constant Progression:  Worsening Chronicity:  New Relieved by:  None tried Worsened by:  Nothing tried Associated symptoms: cough   Associated symptoms: no fever and no vomiting   pt reports she had sudden onset of SOB earlier She reports mild chest tightness She reports it is difficult to take deep breath and pain is worse with deep breathing No hemoptysis, but she does have cough No fever She reports mild abdominal discomfort from coughing  Pt is s/p right total knee arthoplasty on 02/04/13     Past Medical History  Diagnosis Date  . Migraine   . High cholesterol     but doesn't take any meds  . Allergic rhinitis   . TIA (transient ischemic attack)   . Migraines   . Hypertension     takes Metoprolol and maxzide daily  . Sleep apnea     sleep study in epic from 2014-uses CPAP  . Pneumonia     at age 54  . History of migraine     last one 6-60months ago;takes Topamax nightly  . Seizures     hx of;last one 2yrs ago;no meds required;states it was brought on by med;only one ever had  . Dizziness     takes Antivert prn  . Stroke     2 TIA  . Arthritis     knees and neck  . Joint pain   . Joint swelling   . GERD (gastroesophageal reflux disease)     takes Aciphex prn  . History of colon polyps   . Cataracts, bilateral     immature    Past Surgical History  Procedure Laterality Date  . Carpel tunnel Right 1988  . Knee surgery Left 2011    replacement  . Disc surgery  1996  . Hernia repair  1987  . Abdominal hysterectomy  1991  . Colonoscopy    . Esophagogastroduodenoscopy      Family History  Problem Relation Age of Onset  . Prostate cancer Other    History  Substance Use Topics  . Smoking status: Never Smoker   . Smokeless tobacco: Not on file  . Alcohol Use: No   OB History   Grav Para Term Preterm Abortions TAB SAB Ect Mult Living                 Review of Systems  Constitutional: Negative for fever.  Respiratory: Positive for cough and shortness of breath.   Gastrointestinal: Negative for vomiting.  All other systems reviewed and are negative.    Allergies  Codeine  Home Medications   Current Outpatient Rx  Name  Route  Sig  Dispense  Refill  . cholecalciferol (VITAMIN D) 1000 UNITS tablet   Oral   Take 2,000 Units by mouth daily.         [name] enoxaparin (LOVENOX) 40 MG/0.4ML injection   Subcutaneous   Inject 0.4 mLs (40 mg total) into the skin daily.   13 Syringe   0   . meclizine (ANTIVERT) 25 MG tablet   Oral  Take 25 mg by mouth as needed for dizziness.          . MEPERITAB 50 MG tablet   Oral   Take 50 mg by mouth every 4 (four) hours as needed (for headache).          . methocarbamol (ROBAXIN) 500 MG tablet   Oral   Take 1-2 tablets (500-1,000 mg total) by mouth every 6 (six) hours as needed.   60 tablet   2   . metoprolol succinate (TOPROL-XL) 100 MG 24 hr tablet   Oral   Take 100 mg by mouth daily.          . ondansetron (ZOFRAN) 4 MG tablet   Oral   Take 1 tablet (4 mg total) by mouth every 6 (six) hours as needed for nausea.   20 tablet   0   . oxyCODONE (OXY IR/ROXICODONE) 5 MG immediate release tablet   Oral   Take 1-2 tablets (5-10 mg total) by mouth every 4 (four) hours as needed.   90 tablet   0   . RABEprazole (ACIPHEX) 20 MG tablet   Oral   Take 20 mg by mouth daily.         [name] topiramate (TOPAMAX) 25 MG tablet   Oral   Take 50 mg by mouth at bedtime.         . triamterene-hydrochlorothiazide (MAXZIDE-25) 37.5-25 MG per tablet   Oral   Take 1 tablet by mouth daily.           . vitamin B-12 (CYANOCOBALAMIN) 1000 MCG tablet   Oral   Take 1,000 mcg by mouth daily.         . vitamin E 400 UNIT capsule   Oral   Take 400 Units by mouth daily.          BP 139/84  Pulse 94  Temp(Src) 99.7 F (37.6 C) (Oral)  Resp 18  SpO2 97% Physical Exam CONSTITUTIONAL: Well developed/well nourished, anxious HEAD: Normocephalic/atraumatic EYES: EOMI/PERRL ENMT: Mucous membranes moist NECK: supple no meningeal signs SPINE:entire spine nontender CV: S1/S2 noted, no murmurs/rubs/gallops noted LUNGS: Lungs are clear to auscultation bilaterally, mild tachypnea noted ABDOMEN: soft, nontender, no rebound or guarding GU:no cva tenderness NEURO: Pt is awake/alert, moves all extremitiesx4 EXTREMITIES: pulses normal/equal in bilateral LE.  She has bandage on right knee. SKIN: warm, color normal PSYCH: anxious  ED Course  Procedures (including critical care time) Labs Review Labs Reviewed  BASIC METABOLIC PANEL - Abnormal; Notable for the following:    Sodium 132 (*)    Chloride 94 (*)    Glucose, Bld 127 (*)    GFR calc non Af Amer 69 (*)    GFR calc Af Amer 80 (*)    All other components within normal limits  CBC WITH DIFFERENTIAL - Abnormal; Notable for the following:    Neutrophils Relative % 82 (*)    Lymphocytes Relative 10 (*)    All other components within normal limits   Imaging Review Dg Chest 2 View  02/06/2013   *RADIOLOGY REPORT*  Clinical Data: Shortness of breath and chest pressure.  CHEST - 2 VIEW  Comparison: Chest radiograph performed 01/25/2013  Findings: There is elevation of the right hemidiaphragm.  Bibasilar linear airspace opacities likely reflect atelectasis, though mild pneumonia might have a similar appearance.  No pleural effusion or pneumothorax is seen.  The heart is normal in size; the mediastinal contour is within normal limits.  No acute osseous abnormalities  are seen.  Cervical spinal fusion hardware is partially imaged.  IMPRESSION:  New elevation of the right hemidiaphragm; bibasilar linear airspace opacities likely reflect atelectasis, though mild pneumonia might have a similar appearance, given the patient's symptoms.   Original Report Authenticated By: Tonia Ghent, M.D.    5:57 AM Pt with recent right TKA, now with sudden onset of SOB.  She appears uncomfortable.   Concern for PE.  Suspicion for ACS is low at this time Will order CT chest Pt agreeable with plan 7:26 AM No PE by CT imaging, she does have pleural effusion and opacity, concern for possible pneumonia Pt is still symptomatic, she can tolerate lying supine and is still mildly tachypneic Will admit D/w dr Isidoro Donning, [facility], will admit for observatio  MDM  No diagnosis found. Nursing notes including past medical history and social history reviewed and considered in documentation xrays reviewed and considered Labs/vital reviewed and considered Previous records reviewed and considered - recent hospital notes reviewed    Date: 02/06/2013  Rate: 88  Rhythm: normal sinus rhythm  QRS Axis: left  Intervals: normal  ST/T Wave abnormalities: nonspecific ST changes  Conduction Disutrbances:right bundle branch block  Narrative Interpretation:   Old EKG Reviewed: unchanged    Joya Gaskins, MD 02/06/13 [phone]

## 2013-02-06 NOTE — Progress Notes (Signed)
SPORTS MEDICINE AND JOINT REPLACEMENT  Georgena Spurling, MD   Altamese Cabal, PA-C [address] [place], [place], [place]  [address]                             [phone]   PROGRESS NOTE  Subjective:  negative for Chest Pain  positive for Shortness of Breath  negative for Nausea/Vomiting   negative for Calf Pain  negative for Bowel Movement   Tolerating Diet: yes         Patient reports pain as 5 on 0-10 scale.    Objective: Vital signs in last 24 hours:   Patient Vitals for the past 24 hrs:  BP Temp Temp src Pulse Resp SpO2 Height Weight  02/06/13 1629 110/59 mmHg - - 112 - - - -  02/06/13 1000 134/72 mmHg - - 87 - - - -  02/06/13 0900 104/72 mmHg 98.2 F (36.8 C) Oral 115 22 95 % 5\' 5"  (1.651 m) 92.126 kg (203 lb 1.6 oz)  02/06/13 0830 134/72 mmHg - - 101 25 96 % - -  02/06/13 0801 132/75 mmHg - - 94 17 100 % - -  02/06/13 0800 - - - 93 20 100 % - -  02/06/13 0700 142/82 mmHg - - 94 - 98 % - -  02/06/13 0515 153/84 mmHg - - 94 - 95 % - -  02/06/13 0500 146/81 mmHg - - - 24 96 % - -  02/06/13 0445 155/127 mmHg - - 109 - 97 % - -  02/06/13 0430 141/79 mmHg - - 96 - 95 % - -  02/06/13 0415 137/80 mmHg - - 96 - 99 % - -  02/06/13 0400 139/86 mmHg - - 89 - 95 % - -  02/06/13 0345 139/84 mmHg - - 94 - 97 % - -  02/06/13 0330 150/87 mmHg - - 105 - 97 % - -  02/06/13 0315 151/85 mmHg - - 95 - 98 % - -  02/06/13 0300 139/80 mmHg - - 88 - 98 % - -  02/06/13 0245 131/82 mmHg - - 105 - 99 % - -  02/05/13 2346 142/86 mmHg 99.7 F (37.6 C) Oral 89 18 93 % - -    @flow {1959:LAST@   Intake/Output from previous day:       Intake/Output this shift:   10/08 0701 - 10/08 1900 In: 3 [I.V.:3] Out: 550 [Urine:550]   Intake/Output     10/07 0701 - 10/08 0700 10/08 0701 - 10/09 0700   I.V. (mL/kg)  3 (0)   Total Intake(mL/kg)  3 (0)   Urine (mL/kg/hr)  550 (0.6)   Total Output   550   Net   -547           LABORATORY DATA:  Recent Labs  02/05/13 0445 02/06/13 0320  WBC  7.0 8.5  HGB 12.6 13.3  HCT 37.2 37.8  PLT 255 225    Recent Labs  02/05/13 0445 02/06/13 0320  NA 135 132*  K 3.5 3.7  CL 97 94*  CO2 27 27  BUN 19 13  CREATININE 1.08 0.87  GLUCOSE 144* 127*  CALCIUM 8.6 8.7   Lab Results  Component Value Date   INR 0.87 01/25/2013    Examination:  General appearance: alert, cooperative and no distress Extremities: Homans sign is negative, no sign of DVT  Wound Exam: clean, dry, intact   Drainage:  None: wound  tissue dry  Motor Exam: EHL and FHL Intact  Sensory Exam: Deep Peroneal normal   Assessment:          ADDITIONAL DIAGNOSIS:  Principal Problem:   Acute respiratory failure with hypoxia Active Problems:   Pleural effusion   S/P right total knee arthroplasty   Hypertension   Hyperlipidemia   Migraines  Acute Blood Loss Anemia   Plan: Physical Therapy as ordered Weight Bearing as Tolerated (WBAT)  DVT Prophylaxis:  Lovenox  CPM applied Pain controlled Called Dr. Isidoro Donning to she if we can take bed rest resrtriction off to start PT tomorrow         Billie Intriago 02/06/2013, 5:34 PM

## 2013-02-06 NOTE — Progress Notes (Signed)
Co-signed for LaTisha Teasley RN/BSN for assessments, IV assessments, vital signs, medication administration, I's & O's, care plans, patient education, and progress notes. Neta Upadhyay M, RN/BSN 

## 2013-02-06 NOTE — H&P (Signed)
History and Physical       Hospital Admission Note Date: 02/06/2013  Patient name: Robyn Aguilar Medical record number: [id number] Date of birth: February 21, 1951 Age: 62 y.o. Gender: female PCP: Cala Bradford, MD    Chief Complaint:  Shortness of breath since yesterday  HPI: Patient is a 62 year old female with history of hypertension, hyperlipidemia (not on any medications) TIA x2, seizures who just underwent right total knee arthroplasty on 02/04/13 by Dr Sherlean Foot, was just discharged home yesterday by the orthopedic service presented to ER for shortness of breath. History was obtained from the patient who stated that she did not realize that she was feeling short of breath until she got home and in the evening had difficulty breathing. She felt some chest heaviness and tightness due to shortness of breath. She denied any fevers or chills or any productive cough. Patient was DC'd on Lovenox daily. She denies any history of heart failure.  ER workup: Chest x-ray showed new elevation of right hemidiaphragm, bibasilar linear airspace opacities possibly at atelectasis although mild pneumonia might have similar appearance. CT angiogram of the chest showed no evidence for pulmonary embolus., Small right pleural effusion, associated patchy airspace opacities likely reflecting atelectasis, lungs otherwise clear  Review of Systems:  Constitutional: Denies fever, chills, diaphoresis, poor appetite and fatigue.  HEENT: Denies photophobia, eye pain, redness, hearing loss, ear pain, congestion, sore throat, rhinorrhea, sneezing, mouth sores, trouble swallowing, neck pain, neck stiffness and tinnitus.   Respiratory: See history of present illness, patient denied any wheezing or productive cough  Cardiovascular: Denies chest pain, palpitations and leg swelling.  Gastrointestinal: Denies nausea, vomiting, abdominal pain, +constipation for last 3 days,  no blood in stool and abdominal distention.  Genitourinary: Denies dysuria, urgency, frequency, hematuria, flank pain and difficulty urinating.  Musculoskeletal please see history of present illness  Skin: Denies pallor, rash and wound.  Neurological: Denies dizziness, seizures, syncope, weakness, light-headedness, numbness and headaches.  Hematological: Denies adenopathy. Easy bruising, personal or family bleeding history  Psychiatric/Behavioral: Denies suicidal ideation, mood changes, confusion, nervousness, sleep disturbance and agitation  Past Medical History: Past Medical History  Diagnosis Date  . Migraine   . High cholesterol     but doesn't take any meds  . Allergic rhinitis   . TIA (transient ischemic attack)   . Migraines   . Hypertension     takes Metoprolol and maxzide daily  . Sleep apnea     sleep study in epic from 2014-uses CPAP  . Pneumonia     at age 89  . History of migraine     last one 6-51months ago;takes Topamax nightly  . Seizures     hx of;last one 6yrs ago;no meds required;states it was brought on by med;only one ever had  . Dizziness     takes Antivert prn  . Stroke     2 TIA  . Arthritis     knees and neck  . Joint pain   . Joint swelling   . GERD (gastroesophageal reflux disease)     takes Aciphex prn  . History of colon polyps   . Cataracts, bilateral     immature    Past Surgical History  Procedure Laterality Date  . Carpel tunnel Right 1988  . Knee surgery Left 2011    replacement  . Disc surgery  1996  . Hernia repair  1987  . Abdominal hysterectomy  1991  . Colonoscopy    . Esophagogastroduodenoscopy      Medications:  Prior to Admission medications   Medication Sig Start Date End Date Taking? Authorizing Provider  cholecalciferol (VITAMIN D) 1000 UNITS tablet Take 2,000 Units by mouth daily.   Yes Historical Provider, MD  enoxaparin (LOVENOX) 40 MG/0.4ML injection Inject 0.4 mLs (40 mg total) into the skin daily. 02/05/13  Yes  Altamese Cabal, PA-C  meclizine (ANTIVERT) 25 MG tablet Take 25 mg by mouth as needed for dizziness.  08/13/12  Yes Historical Provider, MD  MEPERITAB 50 MG tablet Take 50 mg by mouth every 4 (four) hours as needed (for headache).  07/17/12  Yes Historical Provider, MD  methocarbamol (ROBAXIN) 500 MG tablet Take 1-2 tablets (500-1,000 mg total) by mouth every 6 (six) hours as needed. 02/05/13  Yes Altamese Cabal, PA-C  metoprolol succinate (TOPROL-XL) 100 MG 24 hr tablet Take 100 mg by mouth daily.  09/04/12  Yes Historical Provider, MD  ondansetron (ZOFRAN) 4 MG tablet Take 1 tablet (4 mg total) by mouth every 6 (six) hours as needed for nausea. 02/05/13  Yes Altamese Cabal, PA-C  oxyCODONE (OXY IR/ROXICODONE) 5 MG immediate release tablet Take 1-2 tablets (5-10 mg total) by mouth every 4 (four) hours as needed. 02/05/13  Yes Altamese Cabal, PA-C  RABEprazole (ACIPHEX) 20 MG tablet Take 20 mg by mouth daily.   Yes Historical Provider, MD  topiramate (TOPAMAX) 25 MG tablet Take 50 mg by mouth at bedtime. 09/12/12  Yes Carmen Dohmeier, MD  triamterene-hydrochlorothiazide (MAXZIDE-25) 37.5-25 MG per tablet Take 1 tablet by mouth daily.  08/21/12  Yes Historical Provider, MD  vitamin B-12 (CYANOCOBALAMIN) 1000 MCG tablet Take 1,000 mcg by mouth daily.   Yes Historical Provider, MD  vitamin E 400 UNIT capsule Take 400 Units by mouth daily.   Yes Historical Provider, MD    Allergies:   Allergies  Allergen Reactions  . Codeine     Social History:  reports that she has never smoked. She does not have any smokeless tobacco history on file. She reports that she does not drink alcohol or use illicit drugs.  Family History: Family History  Problem Relation Age of Onset  . Prostate cancer Other     Physical Exam: Blood pressure 132/75, pulse 94, temperature 99.7 F (37.6 C), temperature source Oral, resp. rate 17, SpO2 100.00%. General: Alert, awake, oriented x3, in no acute distress. HEENT: normocephalic,  atraumatic, anicteric sclera, pink conjunctiva, pupils equal and reactive to light and accomodation, oropharynx clear Neck: supple, no masses or lymphadenopathy, no goiter, no bruits  Heart: Regular rate and rhythm, without murmurs, rubs or gallops. Lungs: decreased breath sound at the bases otherwise fairly clear, no wheezing or rhonchi Abdomen: Soft, nontender, nondistended, positive bowel sounds, no masses. Extremities: No clubbing, cyanosis or edema with positive pedal pulses. dressing intact on the right knee with a TED hoses bilateral Neuro: Grossly intact, no focal neurological deficits, strength 5/5 upper and lower extremities bilaterally Psych: alert and oriented x 3, normal mood and affect Skin: no rashes or lesions, warm and dry   LABS on Admission:  Basic Metabolic Panel:  Recent Labs Lab 02/05/13 0445 02/06/13 0320  NA 135 132*  K 3.5 3.7  CL 97 94*  CO2 27 27  GLUCOSE 144* 127*  BUN 19 13  CREATININE 1.08 0.87  CALCIUM 8.6 8.7   Liver Function Tests: No results found for this basename: AST, ALT, ALKPHOS, BILITOT, PROT, ALBUMIN,  in the last 168 hours No results found for this basename: LIPASE, AMYLASE,  in the last 168  hours No results found for this basename: AMMONIA,  in the last 168 hours CBC:  Recent Labs Lab 02/05/13 0445 02/06/13 0320  WBC 7.0 8.5  NEUTROABS  --  6.9  HGB 12.6 13.3  HCT 37.2 37.8  MCV 82.9 81.1  PLT 255 225     Radiological Exams on Admission: Dg Chest 2 View  02/06/2013   *RADIOLOGY REPORT*  Clinical Data: Shortness of breath and chest pressure.  CHEST - 2 VIEW  Comparison: Chest radiograph performed 01/25/2013  Findings: There is elevation of the right hemidiaphragm.  Bibasilar linear airspace opacities likely reflect atelectasis, though mild pneumonia might have a similar appearance.  No pleural effusion or pneumothorax is seen.  The heart is normal in size; the mediastinal contour is within normal limits.  No acute osseous  abnormalities are seen.  Cervical spinal fusion hardware is partially imaged.  IMPRESSION: New elevation of the right hemidiaphragm; bibasilar linear airspace opacities likely reflect atelectasis, though mild pneumonia might have a similar appearance, given the patient's symptoms.   Original Report Authenticated By: Tonia Ghent, M.D.   Dg Chest 2 View  01/25/2013   CLINICAL DATA:  Preop  EXAM: CHEST  2 VIEW  COMPARISON:  06/30/2008  FINDINGS: Cardiomediastinal silhouette is stable. No acute infiltrate or pleural effusion. No pulmonary edema. Bony thorax is unremarkable. Metallic fixation plate noted cervical spine.  IMPRESSION: No active cardiopulmonary disease.   Electronically Signed   By: Natasha Mead   On: 01/25/2013 10:50   Ct Angio Chest Pe W/cm &/or Wo Cm  02/06/2013   *RADIOLOGY REPORT*  Clinical Data: Sudden onset of upper chest pressure and heaviness; shortness of breath.  Recent total knee replacement.  CT ANGIOGRAPHY CHEST  Technique:  Multidetector CT imaging of the chest using the standard protocol during bolus administration of intravenous contrast. Multiplanar reconstructed images including MIPs were obtained and reviewed to evaluate the vascular anatomy.  Contrast: 71mL OMNIPAQUE IOHEXOL 350 MG/ML SOLN  Comparison: Chest radiograph performed earlier today at 12:13 a.m.  Findings: There is no evidence of pulmonary embolus.  A small right pleural effusion is noted, with associated patchy airspace opacity, likely reflecting atelectasis.  Minimal left basilar atelectasis is noted.  There is no evidence of pneumothorax.  No masses are identified; no abnormal focal contrast enhancement is seen.  The mediastinum is unremarkable in appearance.  No pericardial effusion is seen.  The great vessels are grossly unremarkable in appearance.  No mediastinal lymphadenopathy is appreciated.  No axillary lymphadenopathy is seen.  The thyroid gland is unremarkable in appearance.  The visualized portions of the  liver and spleen are unremarkable.  No acute osseous abnormalities are seen.  Cervical spinal fusion hardware is partially imaged.  IMPRESSION:  1.  No evidence of pulmonary embolus. 2.  Small right pleural effusion, with associated patchy airspace opacity, likely reflecting atelectasis.  Minimal left basilar atelectasis noted.  Lungs otherwise clear.   Original Report Authenticated By: Tonia Ghent, M.D.    Assessment/Plan Principal Problem:   Acute respiratory failure with hypoxia: Differential diagnosis after surgery includes PE which has been ruled out, atelectasis, pneumonia possibly due to aspiration, pleural effusion with possible fluid overload ? Underlying CHF (she has history of hypertension, hyperlipidemia), cardiac event/ACS. EKG reviewed, no acute ST-T wave changes suggestive of any ischemia. She is still quite short of breath and tachypneic, on 2 L O2 via nasal cannula. - Admit to the patient for telemetry, obtain stat cardiac enzymes, BNP, will give one dose of  Lasix 40 mg x1 and reassess. - 2-D echocardiogram to rule out underlying CHF - Patient was started on IV antibiotics in the ER, for now we'll continue, obtain blood cultures, urine Legionella antigen, urine strep antigen, continue nebs, incentive spirometry,O2. Narrow antibiotics tomorrow, if no fever chills or any leukocytosis.    Active Problems:   Pleural effusion: - Will give one dose of Lasix, follow BNP, repeat chest x-ray tomorrow    S/P right total knee arthroplasty - Patient will need to be started on physical therapy, will have orthopedics, Dr. Sherlean Foot followup. - Patient has significant constipation, place on Plavix, MiraLax, Dulcolax suppository, last BM was on Monday before the surgery    Hypertension - Continue Toprol-XL,  Lasix 40 mg IV x1 and reassess - Patient will be restarted on her home medications, triamterene/HCTZ tomorrow  Migraines: on Topamax for migraines    Hyperlipidemia: She's not on any  medications, check lipid panel  DVT prophylaxis: Continue Lovenox  CODE STATUS: Discussed in detail with the patient, she opted to be FULL CODE STATUS  Further plan will depend as patient's clinical course evolves and further radiologic and laboratory data become available.   Time Spent on Admission: 1 hour  RAI,RIPUDEEP M.D. [facility] 02/06/2013, 8:14 AM Pager: [phone]  If 7PM-7AM, please contact night-coverage [email or web address] Password [email or web address]

## 2013-02-06 NOTE — ED Notes (Signed)
Patient transported to CT 

## 2013-02-06 NOTE — ED Notes (Signed)
Hospitalist in room with patient for evaluation.  Respiratory at bedside to administer breathing treatment.

## 2013-02-07 DIAGNOSIS — G43909 Migraine, unspecified, not intractable, without status migrainosus: Secondary | ICD-10-CM

## 2013-02-07 DIAGNOSIS — I10 Essential (primary) hypertension: Secondary | ICD-10-CM

## 2013-02-07 DIAGNOSIS — J9600 Acute respiratory failure, unspecified whether with hypoxia or hypercapnia: Secondary | ICD-10-CM

## 2013-02-07 DIAGNOSIS — Z96659 Presence of unspecified artificial knee joint: Secondary | ICD-10-CM

## 2013-02-07 DIAGNOSIS — J189 Pneumonia, unspecified organism: Secondary | ICD-10-CM

## 2013-02-07 DIAGNOSIS — E785 Hyperlipidemia, unspecified: Secondary | ICD-10-CM

## 2013-02-07 LAB — CBC
HCT: 32.4 % — ABNORMAL LOW (ref 36.0–46.0)
Hemoglobin: 11.2 g/dL — ABNORMAL LOW (ref 12.0–15.0)
MCH: 28.1 pg (ref 26.0–34.0)
MCHC: 34.6 g/dL (ref 30.0–36.0)
MCV: 81.4 fL (ref 78.0–100.0)
Platelets: 208 10*3/uL (ref 150–400)
RBC: 3.98 MIL/uL (ref 3.87–5.11)
RDW: 15 % (ref 11.5–15.5)
WBC: 7.7 10*3/uL (ref 4.0–10.5)

## 2013-02-07 LAB — URINE CULTURE
Colony Count: NO GROWTH
Culture: NO GROWTH

## 2013-02-07 LAB — LIPID PANEL
Cholesterol: 170 mg/dL (ref 0–200)
HDL: 16 mg/dL — ABNORMAL LOW (ref >39)
LDL Cholesterol: 130 mg/dL — ABNORMAL HIGH (ref 0–99)
Total CHOL/HDL Ratio: 10.6 RATIO
Triglycerides: 120 mg/dL (ref <150)
VLDL: 24 mg/dL (ref 0–40)

## 2013-02-07 LAB — BASIC METABOLIC PANEL
BUN: 12 mg/dL (ref 6–23)
CO2: 30 mEq/L (ref 19–32)
Calcium: 8.7 mg/dL (ref 8.4–10.5)
Chloride: 99 mEq/L (ref 96–112)
Creatinine, Ser: 0.93 mg/dL (ref 0.50–1.10)
GFR calc Af Amer: 74 mL/min — ABNORMAL LOW (ref >90)
GFR calc non Af Amer: 64 mL/min — ABNORMAL LOW (ref >90)
Glucose, Bld: 128 mg/dL — ABNORMAL HIGH (ref 70–99)
Potassium: 3 mEq/L — ABNORMAL LOW (ref 3.5–5.1)
Sodium: 137 mEq/L (ref 135–145)

## 2013-02-07 LAB — LEGIONELLA ANTIGEN, URINE: Legionella Antigen, Urine: NEGATIVE

## 2013-02-07 LAB — TSH: TSH: 0.556 u[IU]/mL (ref 0.350–4.500)

## 2013-02-07 MED ORDER — FUROSEMIDE 10 MG/ML IJ SOLN
40.0000 mg | Freq: Two times a day (BID) | INTRAMUSCULAR | Status: DC
  Administered 2013-02-07 – 2013-02-09 (×5): 40 mg via INTRAVENOUS
  Filled 2013-02-07 (×7): qty 4

## 2013-02-07 MED ORDER — BUDESONIDE 0.25 MG/2ML IN SUSP
0.2500 mg | Freq: Two times a day (BID) | RESPIRATORY_TRACT | Status: DC
  Administered 2013-02-08 – 2013-02-09 (×3): 0.25 mg via RESPIRATORY_TRACT
  Filled 2013-02-07 (×7): qty 2

## 2013-02-07 MED ORDER — POTASSIUM CHLORIDE CRYS ER 20 MEQ PO TBCR
40.0000 meq | EXTENDED_RELEASE_TABLET | ORAL | Status: AC
  Administered 2013-02-07 – 2013-02-08 (×3): 40 meq via ORAL
  Filled 2013-02-07 (×3): qty 2

## 2013-02-07 MED ORDER — PERFLUTREN LIPID MICROSPHERE
1.0000 mL | INTRAVENOUS | Status: AC | PRN
  Administered 2013-02-07: 2 mL via INTRAVENOUS
  Filled 2013-02-07: qty 10

## 2013-02-07 MED ORDER — PERFLUTREN LIPID MICROSPHERE
INTRAVENOUS | Status: AC
  Filled 2013-02-07: qty 10

## 2013-02-07 NOTE — Progress Notes (Signed)
Pt. Rabeprazole DR 20mg  tablets brought to hospital by husband. RN to take medication to pharmacy to be dispensed to pt. Suly Vukelich, Cheryll Dessert

## 2013-02-07 NOTE — Evaluation (Addendum)
Physical Therapy Evaluation Patient Details Name: Marvis Bakken MRN: [id number] DOB: 23-Nov-1950 Today's Date: 02/07/2013 Time: 1130-1208 PT Time Calculation (min): 38 min  PT Assessment / Plan / Recommendation History of Present Illness  62 y.o. female admitted to [facility] on 02/04/13 for elective TKA on the right.  She is WBAT.  DC home 10/6 returned with SOB, right pleural effusion  Clinical Impression  Pt with continued deficits in mobility from R TKA and limited activity due to respiratory status. Pt reports dizziness with initiation of gait with sats down to 88% on 3L cueing for breathing technique with sats 92% rest of ambulation with decreased speed. Pt will benefit from acute therapy to maximize mobility, gait, ROM and strength.    PT Assessment  Patient needs continued PT services    Follow Up Recommendations  Home health PT    Does the patient have the potential to tolerate intense rehabilitation      Barriers to Discharge        Equipment Recommendations  None recommended by PT    Recommendations for Other Services     Frequency Min 5X/week    Precautions / Restrictions Precautions Precautions: Knee Precaution Comments: watch O2 sats Restrictions Weight Bearing Restrictions: Yes RLE Weight Bearing: Weight bearing as tolerated Other Position/Activity Restrictions: no pillow under right knee   Pertinent Vitals/Pain 6/10 RLE HR 100-108 sats 88-99% on 2-3L K 3.0 not yet repleted, MD & RN aware and agreeable to mobility, NSR throughout activity      Mobility  Bed Mobility Bed Mobility: Supine to Sit Supine to Sit: 4: Min assist;HOB elevated;With rails Sitting - Scoot to Edge of Bed: 5: Supervision Details for Bed Mobility Assistance: cueing for sequence with assist for RLE and cueing to assist with LLE Transfers Sit to Stand: 4: Min guard;From bed Stand to Sit: 4: Min guard;To chair/3-in-1 Details for Transfer Assistance: guarding for  safety Ambulation/Gait Ambulation/Gait Assistance: 4: Min guard Ambulation Distance (Feet): 140 Feet Assistive device: Rolling walker Ambulation/Gait Assistance Details: cues for posture and sequence Gait Pattern: Step-to pattern;Antalgic;Decreased weight shift to right;Decreased step length - left Gait velocity: decreased Stairs: No    Exercises Total Joint Exercises Heel Slides: AAROM;Right;15 reps;Seated Hip ABduction/ADduction: AAROM;Right;15 reps;Seated Long Arc Quad: AROM;Right;15 reps;Seated   PT Diagnosis: Difficulty walking;Abnormality of gait;Generalized weakness;Acute pain  PT Problem List: Decreased strength;Decreased range of motion;Decreased activity tolerance;Decreased mobility;Pain;Cardiopulmonary status limiting activity PT Treatment Interventions: DME instruction;Gait training;Stair training;Functional mobility training;Therapeutic activities;Therapeutic exercise;Patient/family education     PT Goals(Current goals can be found in the care plan section) Acute Rehab PT Goals Patient Stated Goal: To return home PT Goal Formulation: With patient/family Time For Goal Achievement: 02/21/13 Potential to Achieve Goals: Good  Visit Information  Last PT Received On: 02/07/13 Assistance Needed: +1 History of Present Illness: 62 y.o. female admitted to [facility] on 02/04/13 for elective TKA on the right.  She is WBAT.  DC home 10/6 returned with SOB, right pleural effusion       Prior Functioning  Home Living Family/patient expects to be discharged to:: Private residence Living Arrangements: Spouse/significant other Available Help at Discharge: Family;Available 24 hours/day Type of Home: House Home Access: Stairs to enter [employer] of Steps: 3 Entrance Stairs-Rails: None Home Layout: Two level;1/2 bath on main level Alternate Level Stairs-Number of Steps: flight Home Equipment: Walker - 2 wheels;Cane - quad;Bedside commode;Shower seat Additional Comments: pt  has CPM at home as well.   Prior Function Level of Independence: Independent with assistive device(s)  Communication Communication: No difficulties Dominant Hand: Right    Cognition  Cognition Arousal/Alertness: Awake/alert Behavior During Therapy: WFL for tasks assessed/performed Overall Cognitive Status: Within Functional Limits for tasks assessed    Extremity/Trunk Assessment Upper Extremity Assessment Upper Extremity Assessment: Overall WFL for tasks assessed Lower Extremity Assessment Lower Extremity Assessment: RLE deficits/detail RLE Deficits / Details: post op weakness grossly 3/5 hip and knee Cervical / Trunk Assessment Cervical / Trunk Assessment: Normal   Balance    End of Session PT - End of Session Equipment Utilized During Treatment: Gait belt;Oxygen Activity Tolerance: Patient tolerated treatment well Patient left: in chair;with call bell/phone within reach;with family/visitor present Nurse Communication: Mobility status CPM Right Knee CPM Right Knee: Off  GP     Toney Sang Beth 02/07/2013, 12:19 PM Delaney Meigs, PT [phone]

## 2013-02-07 NOTE — Progress Notes (Signed)
Utilization Review Completed.   Cinch Ormond, RN, BSN Nurse Case Manager  [phone]  

## 2013-02-07 NOTE — Progress Notes (Signed)
Patient up in chair this morning and after breakfast assisted patient with walker back to bed. Patient has shortness of breath with 2 liters of oxygen on but once in bed breathing returned to normal. Shortly thereafter, 2D Echo Technicians arrived to perform echo with contrast. Shortly thereafter, physical therapy worked with patient and after they worked with her patient had pain from total knee replacement, a 5 on scale of 1-10. Patient stated she wanted pain medication once her lunch tray arrived. Therefore pain medication given right at lunch time. Right after lunch, Orhopedic technician arrived to put on CPM machine for the patient's Right knee and was set to 30 degrees. Will continue to monitor to end of shift.

## 2013-02-07 NOTE — Progress Notes (Signed)
[facility] PROGRESS NOTE  Filed Weights   02/06/13 0900 02/07/13 0651  Weight: 92.126 kg (203 lb 1.6 oz) 91.128 kg (200 lb 14.4 oz)        Intake/Output Summary (Last 24 hours) at 02/07/13 1412 Last data filed at 02/07/13 1100  Gross per 24 hour  Intake   1080 ml  Output   3075 ml  Net  -1995 ml    Assessment/Plan: 1-Acute respiratory failure with hypoxia : Most likely a combination of diastolic heart failure exacerbation and healthcare associated pneumonia. -Will continue IV antibiotics -IV Lasix -strict Intake and output -daily weight -IS -PRN nebulizer and pulmicort BID -will repeat CXR in am -Patient negative almost 2 L.  2-diastolic heart failure: 2-D echo demonstrating grade 1 heart failure; ejection fraction preserved (65-70%), no wall motion abnormalities and just a small pericardial effusion.  -as mentioned above will continue IV lasix -strict I's and O's and daily weight -heart healthy diet  3-S/P right total knee arthroplasty: There was a. Recommendations.  4-Hypertension: Is stable. Will continue current antihypertensive regimen.  5-Hyperlipidemia: Continue statins.  6-hypokalemia: Secondary to diuresis. Will replete and follow basic metabolic in the morning.  7-migraines: Continue Topamax. Currently without headaches.  DVT: lovenox   Code Status: Full Family Communication: husband at bedside Disposition Plan: home with [facility] services once medically stable   Consultants:  None   Procedures: ECHO:  -The cavity size was normal. There was moderate concentric hypertrophy. Systolic function was vigorous. The estimated ejection fraction was in the range of 65% to 70%. Wall motion was normal; there were no regional wall motion abnormalities. Doppler parameters are consistent with abnormal left ventricular relaxation (grade 1 diastolic dysfunction). -mild pericardial effusion  Antibiotics:  Vancomycin  Cefepime  HPI/Subjective: Patient  is still with significant shortness of breath and also coughing spells. Denies chest pain, nausea/vomiting, abdominal pain or any other acute complaints.  Objective: Filed Vitals:   02/06/13 2104 02/07/13 0651 02/07/13 1024 02/07/13 1158  BP: 103/65 112/65 112/64   Pulse: 103 96 100 108  Temp: 97.2 F (36.2 C) 97.6 F (36.4 C) 97.4 F (36.3 C)   TempSrc: Oral Oral    Resp: 22 20    Height:      Weight:  91.128 kg (200 lb 14.4 oz)    SpO2: 100% 97% 99% 98%     Exam:  General: Alert, awake, oriented x3, in mild distress secondary to shortness of breath. Patient currently requiring 3 L of oxygen to keep oxygen saturation above 88%. HEENT: No bruits, no goiter.  Heart: S1 and S2 appreciated on exam; mild tachycardia, no murmurs, no gallops or rubs.  Lungs: Positive rhonchi, slight expiratory wheezing and bibasilar crackles. Abdomen: Soft, nontender, nondistended, positive bowel sounds.  Neuro: Grossly intact, nonfocal. Extremities: RLE with dry dressing around knee; CPM applied. Trace edema bilaterally.   Data Reviewed: Basic Metabolic Panel:  Recent Labs Lab 02/05/13 0445 02/06/13 0320 02/07/13 0650  NA 135 132* 137  K 3.5 3.7 3.0*  CL 97 94* 99  CO2 27 27 30   GLUCOSE 144* 127* 128*  BUN 19 13 12   CREATININE 1.08 0.87 0.93  CALCIUM 8.6 8.7 8.7   CBC:  Recent Labs Lab 02/05/13 0445 02/06/13 0320 02/07/13 0650  WBC 7.0 8.5 7.7  NEUTROABS  --  6.9  --   HGB 12.6 13.3 11.2*  HCT 37.2 37.8 32.4*  MCV 82.9 81.1 81.4  PLT 255 225 208   Cardiac Enzymes:  Recent Labs Lab  02/06/13 0800 02/06/13 1400 02/06/13 2000  CKTOTAL 297* 261* 196*  CKMB 4.5* 4.0 3.1  TROPONINI <0.30 <0.30 <0.30   BNP (last 3 results)  Recent Labs  02/06/13 0800  PROBNP 347.7*    Recent Results (from the past 240 hour(s))  URINE CULTURE     Status: None   Collection Time    02/06/13 11:24 AM      Result Value Range Status   Specimen Description URINE, RANDOM   Final    Special Requests NONE   Final   Culture  Setup Time     Final   Value: 02/06/2013 12:25     Performed at [facility] Count     Final   Value: NO GROWTH     Performed at [facility]   Culture     Final   Value: NO GROWTH     Performed at [facility]   Report Status 02/07/2013 FINAL   Final  CULTURE, BLOOD (ROUTINE X 2)     Status: None   Collection Time    02/06/13 11:55 AM      Result Value Range Status   Specimen Description BLOOD LEFT ARM   Final   Special Requests BOTTLES DRAWN AEROBIC ONLY 5CC   Final   Culture  Setup Time     Final   Value: 02/06/2013 20:49     Performed at [facility]   Culture     Final   Value:        BLOOD CULTURE RECEIVED NO GROWTH TO DATE CULTURE WILL BE HELD FOR 5 DAYS BEFORE ISSUING A FINAL NEGATIVE REPORT     Performed at [facility]   Report Status PENDING   Incomplete  CULTURE, BLOOD (ROUTINE X 2)     Status: None   Collection Time    02/06/13 12:00 PM      Result Value Range Status   Specimen Description BLOOD LEFT HAND   Final   Special Requests BOTTLES DRAWN AEROBIC ONLY 5CC   Final   Culture  Setup Time     Final   Value: 02/06/2013 20:49     Performed at [facility]   Culture     Final   Value:        BLOOD CULTURE RECEIVED NO GROWTH TO DATE CULTURE WILL BE HELD FOR 5 DAYS BEFORE ISSUING A FINAL NEGATIVE REPORT     Performed at [facility]   Report Status PENDING   Incomplete     Studies: Dg Chest 2 View  02/06/2013   *RADIOLOGY REPORT*  Clinical Data: Shortness of breath and chest pressure.  CHEST - 2 VIEW  Comparison: Chest radiograph performed 01/25/2013  Findings: There is elevation of the right hemidiaphragm.  Bibasilar linear airspace opacities likely reflect atelectasis, though mild pneumonia might have a similar appearance.  No pleural effusion or pneumothorax is seen.  The heart is normal in size; the mediastinal contour is within normal limits.  No acute  osseous abnormalities are seen.  Cervical spinal fusion hardware is partially imaged.  IMPRESSION: New elevation of the right hemidiaphragm; bibasilar linear airspace opacities likely reflect atelectasis, though mild pneumonia might have a similar appearance, given the patient's symptoms.   Original Report Authenticated By: Tonia Ghent, M.D.   Ct Angio Chest Pe W/cm &/or Wo Cm  02/06/2013   *RADIOLOGY REPORT*  Clinical Data: Sudden onset of upper chest pressure and heaviness; shortness of  breath.  Recent total knee replacement.  CT ANGIOGRAPHY CHEST  Technique:  Multidetector CT imaging of the chest using the standard protocol during bolus administration of intravenous contrast. Multiplanar reconstructed images including MIPs were obtained and reviewed to evaluate the vascular anatomy.  Contrast: 71mL OMNIPAQUE IOHEXOL 350 MG/ML SOLN  Comparison: Chest radiograph performed earlier today at 12:13 a.m.  Findings: There is no evidence of pulmonary embolus.  A small right pleural effusion is noted, with associated patchy airspace opacity, likely reflecting atelectasis.  Minimal left basilar atelectasis is noted.  There is no evidence of pneumothorax.  No masses are identified; no abnormal focal contrast enhancement is seen.  The mediastinum is unremarkable in appearance.  No pericardial effusion is seen.  The great vessels are grossly unremarkable in appearance.  No mediastinal lymphadenopathy is appreciated.  No axillary lymphadenopathy is seen.  The thyroid gland is unremarkable in appearance.  The visualized portions of the liver and spleen are unremarkable.  No acute osseous abnormalities are seen.  Cervical spinal fusion hardware is partially imaged.  IMPRESSION:  1.  No evidence of pulmonary embolus. 2.  Small right pleural effusion, with associated patchy airspace opacity, likely reflecting atelectasis.  Minimal left basilar atelectasis noted.  Lungs otherwise clear.   Original Report Authenticated By:  Tonia Ghent, M.D.    Scheduled Meds: . albuterol  2.5 mg Nebulization BID  . ceFEPime (MAXIPIME) IV  1 g Intravenous Q8H  . cholecalciferol  2,000 Units Oral Daily  . docusate sodium  100 mg Oral BID  . enoxaparin  40 mg Subcutaneous Q24H  . furosemide  40 mg Intravenous Q12H  .  HYDROmorphone (DILAUDID) injection  1 mg Intravenous Once  . metoprolol succinate  50 mg Oral Daily  . pantoprazole  40 mg Oral Daily  . polyethylene glycol  17 g Oral Daily  . potassium chloride  40 mEq Oral Q4H  . sodium chloride  3 mL Intravenous Q12H  . topiramate  50 mg Oral QHS  . vancomycin  1,250 mg Intravenous Q12H  . vitamin B-12  1,000 mcg Oral Daily  . vitamin E  400 Units Oral Daily   Continuous Infusions:    Marcelis Wissner  [facility] Pager [phone]. If 8PM-8AM, please contact night-coverage at [email or web address], password [facility] 02/07/2013, 2:12 PM  LOS: 1 day

## 2013-02-07 NOTE — Progress Notes (Signed)
Notified attending doctor of potassium of 3.0. No orders given, will continue to monitor to end of shift.

## 2013-02-07 NOTE — Progress Notes (Signed)
Orthopedic Tech Progress Note Patient Details:  Robyn Aguilar 1950/11/20 [id number]  CPM Right Knee CPM Right Knee: On Right Knee Flexion (Degrees): 30 Right Knee Extension (Degrees): 0   Robyn Aguilar, Robyn Aguilar 02/07/2013, 1:18 PM

## 2013-02-07 NOTE — Progress Notes (Signed)
  Echocardiogram 2D Echocardiogram (with Definity) has been performed.  Robyn Aguilar 02/07/2013, 10:44 AM

## 2013-02-08 ENCOUNTER — Inpatient Hospital Stay (HOSPITAL_COMMUNITY): Payer: BC Managed Care – PPO

## 2013-02-08 ENCOUNTER — Encounter (HOSPITAL_COMMUNITY): Payer: Self-pay | Admitting: Orthopedic Surgery

## 2013-02-08 DIAGNOSIS — G4733 Obstructive sleep apnea (adult) (pediatric): Secondary | ICD-10-CM

## 2013-02-08 LAB — BASIC METABOLIC PANEL
BUN: 12 mg/dL (ref 6–23)
CO2: 29 mEq/L (ref 19–32)
Calcium: 8.9 mg/dL (ref 8.4–10.5)
Chloride: 100 mEq/L (ref 96–112)
Creatinine, Ser: 0.91 mg/dL (ref 0.50–1.10)
GFR calc Af Amer: 76 mL/min — ABNORMAL LOW (ref >90)
GFR calc non Af Amer: 66 mL/min — ABNORMAL LOW (ref >90)
Glucose, Bld: 135 mg/dL — ABNORMAL HIGH (ref 70–99)
Potassium: 3.2 mEq/L — ABNORMAL LOW (ref 3.5–5.1)
Sodium: 140 mEq/L (ref 135–145)

## 2013-02-08 MED ORDER — AMOXICILLIN-POT CLAVULANATE 875-125 MG PO TABS
1.0000 | ORAL_TABLET | Freq: Two times a day (BID) | ORAL | Status: DC
  Administered 2013-02-08 – 2013-02-09 (×3): 1 via ORAL
  Filled 2013-02-08 (×4): qty 1

## 2013-02-08 MED ORDER — MEPERIDINE HCL 50 MG PO TABS: 50.0000 mg | ORAL_TABLET | ORAL | Status: DC | PRN

## 2013-02-08 MED ORDER — METOPROLOL SUCCINATE ER 50 MG PO TB24: 50.0000 mg | ORAL_TABLET | Freq: Every day | ORAL | Status: DC

## 2013-02-08 MED ORDER — POTASSIUM CHLORIDE CRYS ER 20 MEQ PO TBCR
40.0000 meq | EXTENDED_RELEASE_TABLET | ORAL | Status: AC
  Administered 2013-02-08 (×3): 40 meq via ORAL
  Filled 2013-02-08 (×2): qty 2

## 2013-02-08 MED ORDER — RABEPRAZOLE SODIUM 20 MG PO TBEC
20.0000 mg | DELAYED_RELEASE_TABLET | Freq: Every day | ORAL | Status: DC
  Administered 2013-02-08: 20 mg via ORAL
  Filled 2013-02-08 (×3): qty 1

## 2013-02-08 NOTE — Progress Notes (Signed)
[facility] PROGRESS NOTE  Filed Weights   02/06/13 0900 02/07/13 0651 02/08/13 0553  Weight: 92.126 kg (203 lb 1.6 oz) 91.128 kg (200 lb 14.4 oz) 89.948 kg (198 lb 4.8 oz)        Intake/Output Summary (Last 24 hours) at 02/08/13 1117 Last data filed at 02/08/13 0646  Gross per 24 hour  Intake    600 ml  Output   4225 ml  Net  -3625 ml    Assessment/Plan: 1-Acute respiratory failure with hypoxia : Most likely a combination of diastolic heart failure exacerbation and healthcare associated pneumonia. -Will switch antibiotics to Augmentin BID (plan is to treat for 6 more days to complete a total of 8 days) -continue IV Lasix X 1 more day -continue strict Intake and output -follow daily weight -continue IS -PRN nebulizer and pulmicort BID -CXR demonstrating improvement in vascular congestion and suggesting atelectasis -Patient negative almost 6 L.  2-diastolic heart failure: 2-D echo demonstrating grade 1 heart failure; ejection fraction preserved (65-70%), no wall motion abnormalities and just a small pericardial effusion.  -as mentioned above will continue IV lasix for 1 more day -strict I's and O's and daily weight -heart healthy low sodium diet -start oxygen titration to RA  3-S/P right total knee arthroplasty: will follow orthopedic service recommendations.  4-Hypertension: Stable. Will continue current antihypertensive regimen.  5-Hyperlipidemia: Continue statins.  6-hypokalemia: Secondary to diuresis. Will continue repletion as needed; will check Mg in am. Today K is 3.2  7-migraines: Continue Topamax and PRN demerol. Currently without headaches.  8-OSA: continue CPAP  DVT: lovenox   Code Status: Full Family Communication: husband at bedside Disposition Plan: home with [facility] services once medically stable   Consultants:  None   Procedures: ECHO:  -The cavity size was normal. There was moderate concentric hypertrophy. Systolic function was vigorous.  The estimated ejection fraction was in the range of 65% to 70%. Wall motion was normal; there were no regional wall motion abnormalities. Doppler parameters are consistent with abnormal left ventricular relaxation (grade 1 diastolic dysfunction). -mild pericardial effusion  Antibiotics:  Vancomycin 10/8 for 2 days  Cefepime 10/8 for 2 days  Augmentin 10/10 (plan si for 6 more days)  HPI/Subjective: Patient is filling a lot better; reports breathing is improving and less labor.  Objective: Filed Vitals:   02/08/13 0130 02/08/13 0553 02/08/13 0804 02/08/13 1012  BP:  126/74  134/81  Pulse: 101 109  107  Temp:  98.6 F (37 C)    TempSrc:  Oral    Resp: 21 20    Height:      Weight:  89.948 kg (198 lb 4.8 oz)    SpO2: 97% 95% 98%      Exam:  General: Alert, awake, oriented x3, afebrile, breathing more comfortable; no CP . Patient currently requiring 2 L of oxygen to keep oxygen saturation above 90%. HEENT: No bruits, no goiter.  Heart: S1 and S2 appreciated on exam; mild tachycardia, no murmurs, no gallops or rubs.  Lungs: scattered rhonchi, no wheezing, decrease BS at baseline; no frank crackles. Abdomen: Soft, nontender, nondistended, positive bowel sounds.  Neuro: Grossly intact, nonfocal. Extremities: RLE with dry dressing around knee; CPM applied. Trace edema bilaterally.   Data Reviewed: Basic Metabolic Panel:  Recent Labs Lab 02/05/13 0445 02/06/13 0320 02/07/13 0650 02/08/13 0545  NA 135 132* 137 140  K 3.5 3.7 3.0* 3.2*  CL 97 94* 99 100  CO2 27 27 30 29   GLUCOSE 144* 127* 128*  135*  BUN 19 13 12 12   CREATININE 1.08 0.87 0.93 0.91  CALCIUM 8.6 8.7 8.7 8.9   CBC:  Recent Labs Lab 02/05/13 0445 02/06/13 0320 02/07/13 0650  WBC 7.0 8.5 7.7  NEUTROABS  --  6.9  --   HGB 12.6 13.3 11.2*  HCT 37.2 37.8 32.4*  MCV 82.9 81.1 81.4  PLT 255 225 208   Cardiac Enzymes:  Recent Labs Lab 02/06/13 0800 02/06/13 1400 02/06/13 2000  CKTOTAL 297*  261* 196*  CKMB 4.5* 4.0 3.1  TROPONINI <0.30 <0.30 <0.30   BNP (last 3 results)  Recent Labs  02/06/13 0800  PROBNP 347.7*    Recent Results (from the past 240 hour(s))  URINE CULTURE     Status: None   Collection Time    02/06/13 11:24 AM      Result Value Range Status   Specimen Description URINE, RANDOM   Final   Special Requests NONE   Final   Culture  Setup Time     Final   Value: 02/06/2013 12:25     Performed at [facility] Count     Final   Value: NO GROWTH     Performed at [facility]   Culture     Final   Value: NO GROWTH     Performed at [facility]   Report Status 02/07/2013 FINAL   Final  CULTURE, BLOOD (ROUTINE X 2)     Status: None   Collection Time    02/06/13 11:55 AM      Result Value Range Status   Specimen Description BLOOD LEFT ARM   Final   Special Requests BOTTLES DRAWN AEROBIC ONLY 5CC   Final   Culture  Setup Time     Final   Value: 02/06/2013 20:49     Performed at [facility]   Culture     Final   Value:        BLOOD CULTURE RECEIVED NO GROWTH TO DATE CULTURE WILL BE HELD FOR 5 DAYS BEFORE ISSUING A FINAL NEGATIVE REPORT     Performed at [facility]   Report Status PENDING   Incomplete  CULTURE, BLOOD (ROUTINE X 2)     Status: None   Collection Time    02/06/13 12:00 PM      Result Value Range Status   Specimen Description BLOOD LEFT HAND   Final   Special Requests BOTTLES DRAWN AEROBIC ONLY 5CC   Final   Culture  Setup Time     Final   Value: 02/06/2013 20:49     Performed at [facility]   Culture     Final   Value:        BLOOD CULTURE RECEIVED NO GROWTH TO DATE CULTURE WILL BE HELD FOR 5 DAYS BEFORE ISSUING A FINAL NEGATIVE REPORT     Performed at [facility]   Report Status PENDING   Incomplete     Studies: Dg Chest 2 View  02/08/2013   CLINICAL DATA:  Postoperative evaluation, shortness of breath.  EXAM: CHEST  2 VIEW  COMPARISON:  Chest radiograph ; CT  chest 02/06/2013.  FINDINGS: Cervical spinal fusion hardware, incompletely visualized. Stable cardiac cardiac and mediastinal contours. Small right pleural effusion. Underlying opacities likely represent atelectasis. Minimal scarring and or atelectasis left lower lobe.  IMPRESSION: Stable small right pleural effusion and underlying opacities, likely atelectasis.   Electronically Signed   By: Kenard Gower  Earlene Plater M.D.   On: 02/08/2013 07:55    Scheduled Meds: . albuterol  2.5 mg Nebulization BID  . amoxicillin-clavulanate  1 tablet Oral Q12H  . budesonide (PULMICORT) nebulizer solution  0.25 mg Nebulization BID  . cholecalciferol  2,000 Units Oral Daily  . docusate sodium  100 mg Oral BID  . enoxaparin  40 mg Subcutaneous Q24H  . furosemide  40 mg Intravenous Q12H  .  HYDROmorphone (DILAUDID) injection  1 mg Intravenous Once  . metoprolol succinate  50 mg Oral Daily  . polyethylene glycol  17 g Oral Daily  . potassium chloride  40 mEq Oral Q4H  . RABEprazole  20 mg Oral Daily  . sodium chloride  3 mL Intravenous Q12H  . topiramate  50 mg Oral QHS  . vitamin B-12  1,000 mcg Oral Daily  . vitamin E  400 Units Oral Daily   Continuous Infusions:    Ritchard Paragas  [facility] Pager [phone]. If 8PM-8AM, please contact night-coverage at [email or web address], password [email or web address] 02/08/2013, 11:17 AM  LOS: 2 days

## 2013-02-08 NOTE — Progress Notes (Signed)
Physical Therapy Treatment Patient Details Name: Robyn Aguilar MRN: [id number] DOB: 12-20-1950 Today's Date: 02/08/2013 Time: 4098-1191 PT Time Calculation (min): 40 min  PT Assessment / Plan / Recommendation  History of Present Illness 62 y.o. female admitted to [facility] on 02/04/13 for elective TKA on the right.  She is WBAT.  DC home 10/6 returned with SOB, right pleural effusion   PT Comments   Pt admitted with above. Pt currently with functional limitations due to decr endurance. Pt will benefit from skilled PT to increase their independence and safety with mobility to allow discharge to the venue listed below.   Follow Up Recommendations  Home health PT                 Equipment Recommendations  None recommended by PT        Frequency Min 5X/week   Progress towards PT Goals Progress towards PT goals: Progressing toward goals  Plan Current plan remains appropriate    Precautions / Restrictions Precautions Precautions: Knee Precaution Comments: watch O2 sats Restrictions Weight Bearing Restrictions: Yes RLE Weight Bearing: Weight bearing as tolerated Other Position/Activity Restrictions: no pillow under right knee   Pertinent Vitals/Pain DOE 3/4, O2 sats 85% and above on RA which did improve with pursed lip breathing.  O2 on RA at rest 94%; replaced O2 as pt with sleep apnea per pt; other VSS, Some right LE pain    Mobility  Bed Mobility Bed Mobility: Supine to Sit Supine to Sit: 4: Min guard;HOB elevated;With rails Sitting - Scoot to Edge of Bed: 5: Supervision Sit to Supine: Not Tested (comment) Details for Bed Mobility Assistance: cueing for sequence with assist for RLE and cueing to assist with LLE Transfers Transfers: Sit to Stand;Stand to Sit Sit to Stand: 4: Min guard;From bed Stand to Sit: 4: Min guard;To chair/3-in-1 Details for Transfer Assistance: guarding for safety Ambulation/Gait Ambulation/Gait Assistance: 4: Min guard Ambulation Distance  (Feet): 150 Feet Assistive device: Rolling walker Ambulation/Gait Assistance Details: cues for sequencing and technique Gait Pattern: Step-to pattern;Antalgic;Decreased weight shift to right;Decreased step length - left Gait velocity: decreased Stairs: No Wheelchair Mobility Wheelchair Mobility: No    Exercises Total Joint Exercises Ankle Circles/Pumps: AROM;Both;10 reps Heel Slides: AAROM;Right;15 reps;Seated Long Arc Quad: AROM;Right;15 reps;Seated Knee Flexion: AAROM;Right;5 reps;Strengthening   PT Goals (current goals can now be found in the care plan section)    Visit Information  Last PT Received On: 02/08/13 Assistance Needed: +1 History of Present Illness: 62 y.o. female admitted to [facility] on 02/04/13 for elective TKA on the right.  She is WBAT.  DC home 10/6 returned with SOB, right pleural effusion    Subjective Data  Subjective: "I have to go slow."   Cognition  Cognition Arousal/Alertness: Awake/alert Behavior During Therapy: WFL for tasks assessed/performed Overall Cognitive Status: Within Functional Limits for tasks assessed    Balance  Static Sitting Balance Static Sitting - Balance Support: Bilateral upper extremity supported;Feet supported Static Sitting - Level of Assistance: 5: Stand by assistance Static Standing Balance Static Standing - Balance Support: Bilateral upper extremity supported Static Standing - Level of Assistance: 4: Min assist  End of Session PT - End of Session Equipment Utilized During Treatment: Gait belt;Oxygen Activity Tolerance: Patient tolerated treatment well Patient left: in chair;with call bell/phone within reach;with family/visitor present Nurse Communication: Mobility status        INGOLD,Orrie Lascano 02/08/2013, 12:28 PM [facility] [phone] (pager)

## 2013-02-08 NOTE — Discharge Summary (Signed)
SPORTS MEDICINE & JOINT REPLACEMENT   Robyn Spurling, MD   Robyn Cabal, PA-C [address] [place], [place], [place]  [address]                             [phone]  PATIENT ID: Robyn Aguilar        MRN:  [id number]          DOB/AGE: Dec 19, 1950 / 62 y.o.    DISCHARGE SUMMARY  ADMISSION DATE:    02/04/2013 DISCHARGE DATE:   02/05/2013  ADMISSION DIAGNOSIS: osteoarthritis right knee    DISCHARGE DIAGNOSIS:  osteoarthritis right knee    ADDITIONAL DIAGNOSIS: Active Problems:   * No active hospital problems. *  Past Medical History  Diagnosis Date  . Migraine   . High cholesterol     but doesn't take any meds  . Allergic rhinitis   . TIA (transient ischemic attack)   . Migraines   . Hypertension     takes Metoprolol and maxzide daily  . Sleep apnea     sleep study in epic from 2014-uses CPAP  . Pneumonia     at age 49  . History of migraine     last one 6-16months ago;takes Topamax nightly  . Seizures     hx of;last one 60yrs ago;no meds required;states it was brought on by med;only one ever had  . Dizziness     takes Antivert prn  . Stroke     2 TIA  . Arthritis     knees and neck  . Joint pain   . Joint swelling   . GERD (gastroesophageal reflux disease)     takes Aciphex prn  . History of colon polyps   . Cataracts, bilateral     immature   . Shortness of breath 02/06/2013    PROCEDURE: Procedure(s): RIGHT TOTAL KNEE ARTHROPLASTY on 02/04/2013  CONSULTS:     HISTORY:  See H&P in chart  HOSPITAL COURSE:  Robyn Aguilar is a 62 y.o. admitted on 02/04/2013 and found to have a diagnosis of osteoarthritis right knee.  After appropriate laboratory studies were obtained  they were taken to the operating room on 02/04/2013 and underwent Procedure(s): RIGHT TOTAL KNEE ARTHROPLASTY.   They were given perioperative antibiotics:  Anti-infectives   Start     Dose/Rate Route Frequency Ordered Stop   02/04/13 1500  ceFAZolin (ANCEF) IVPB 2 g/50  mL premix     2 g 100 mL/hr over 30 Minutes Intravenous Every 6 hours 02/04/13 1111 02/04/13 2241   02/04/13 0600  ceFAZolin (ANCEF) IVPB 2 g/50 mL premix     2 g 100 mL/hr over 30 Minutes Intravenous On call to O.R. 02/03/13 1323 02/04/13 0855    .  Tolerated the procedure well.  Placed with a foley intraoperatively.  Given Ofirmev at induction and for 48 hours.    POD# 1: Vital signs were stable.  Patient denied Chest pain, shortness of breath, or calf pain.  Patient was started on Lovenox 30 mg subcutaneously twice daily at 8am.  Consults to PT, OT, and care management were made.  The patient was weight bearing as tolerated.  CPM was placed on the operative leg 0-90 degrees for 6-8 hours a day.  Incentive spirometry was taught.  Dressing was changed.  Marcaine pump and hemovac were discontinued.          The remainder of the hospital course was dedicated to ambulation and  strengthening.   The patient was discharged on 1 day post op in  Good condition.  Blood products given:none  DIAGNOSTIC STUDIES: Recent vital signs: No data found.      Recent laboratory studies:  Recent Labs  02/05/13 0445 02/06/13 0320 02/07/13 0650  WBC 7.0 8.5 7.7  HGB 12.6 13.3 11.2*  HCT 37.2 37.8 32.4*  PLT 255 225 208    Recent Labs  02/05/13 0445 02/06/13 0320 02/07/13 0650 02/08/13 0545  NA 135 132* 137 140  K 3.5 3.7 3.0* 3.2*  CL 97 94* 99 100  CO2 27 27 30 29   BUN 19 13 12 12   CREATININE 1.08 0.87 0.93 0.91  GLUCOSE 144* 127* 128* 135*  CALCIUM 8.6 8.7 8.7 8.9   Lab Results  Component Value Date   INR 0.87 01/25/2013     Recent Radiographic Studies :  Dg Chest 2 View  02/08/2013   CLINICAL DATA:  Postoperative evaluation, shortness of breath.  EXAM: CHEST  2 VIEW  COMPARISON:  Chest radiograph ; CT chest 02/06/2013.  FINDINGS: Cervical spinal fusion hardware, incompletely visualized. Stable cardiac cardiac and mediastinal contours. Small right pleural effusion. Underlying  opacities likely represent atelectasis. Minimal scarring and or atelectasis left lower lobe.  IMPRESSION: Stable small right pleural effusion and underlying opacities, likely atelectasis.   Electronically Signed   By: Annia Belt M.D.   On: 02/08/2013 07:55   Dg Chest 2 View  02/06/2013   *RADIOLOGY REPORT*  Clinical Data: Shortness of breath and chest pressure.  CHEST - 2 VIEW  Comparison: Chest radiograph performed 01/25/2013  Findings: There is elevation of the right hemidiaphragm.  Bibasilar linear airspace opacities likely reflect atelectasis, though mild pneumonia might have a similar appearance.  No pleural effusion or pneumothorax is seen.  The heart is normal in size; the mediastinal contour is within normal limits.  No acute osseous abnormalities are seen.  Cervical spinal fusion hardware is partially imaged.  IMPRESSION: New elevation of the right hemidiaphragm; bibasilar linear airspace opacities likely reflect atelectasis, though mild pneumonia might have a similar appearance, given the patient's symptoms.   Original Report Authenticated By: Tonia Ghent, M.D.   Dg Chest 2 View  01/25/2013   CLINICAL DATA:  Preop  EXAM: CHEST  2 VIEW  COMPARISON:  06/30/2008  FINDINGS: Cardiomediastinal silhouette is stable. No acute infiltrate or pleural effusion. No pulmonary edema. Bony thorax is unremarkable. Metallic fixation plate noted cervical spine.  IMPRESSION: No active cardiopulmonary disease.   Electronically Signed   By: Natasha Mead   On: 01/25/2013 10:50   Ct Angio Chest Pe W/cm &/or Wo Cm  02/06/2013   *RADIOLOGY REPORT*  Clinical Data: Sudden onset of upper chest pressure and heaviness; shortness of breath.  Recent total knee replacement.  CT ANGIOGRAPHY CHEST  Technique:  Multidetector CT imaging of the chest using the standard protocol during bolus administration of intravenous contrast. Multiplanar reconstructed images including MIPs were obtained and reviewed to evaluate the vascular  anatomy.  Contrast: 71mL OMNIPAQUE IOHEXOL 350 MG/ML SOLN  Comparison: Chest radiograph performed earlier today at 12:13 a.m.  Findings: There is no evidence of pulmonary embolus.  A small right pleural effusion is noted, with associated patchy airspace opacity, likely reflecting atelectasis.  Minimal left basilar atelectasis is noted.  There is no evidence of pneumothorax.  No masses are identified; no abnormal focal contrast enhancement is seen.  The mediastinum is unremarkable in appearance.  No pericardial effusion is seen.  The great  vessels are grossly unremarkable in appearance.  No mediastinal lymphadenopathy is appreciated.  No axillary lymphadenopathy is seen.  The thyroid gland is unremarkable in appearance.  The visualized portions of the liver and spleen are unremarkable.  No acute osseous abnormalities are seen.  Cervical spinal fusion hardware is partially imaged.  IMPRESSION:  1.  No evidence of pulmonary embolus. 2.  Small right pleural effusion, with associated patchy airspace opacity, likely reflecting atelectasis.  Minimal left basilar atelectasis noted.  Lungs otherwise clear.   Original Report Authenticated By: Tonia Ghent, M.D.    DISCHARGE INSTRUCTIONS: Discharge Orders   Future Orders Complete By Expires   Call MD / Call 911  As directed    Comments:     If you experience chest pain or shortness of breath, CALL 911 and be transported to the hospital emergency room.  If you develope a fever above 101 F, pus (white drainage) or increased drainage or redness at the wound, or calf pain, call your surgeon's office.   Change dressing  As directed    Comments:     Change dressing on wednesday, then change the dressing daily with sterile 4 x 4 inch gauze dressing and apply TED hose.   Constipation Prevention  As directed    Comments:     Drink plenty of fluids.  Prune juice may be helpful.  You may use a stool softener, such as Colace (over the counter) 100 mg twice a day.  Use  MiraLax (over the counter) for constipation as needed.   CPM  As directed    Comments:     Continuous passive motion machine (CPM):      Use the CPM from 0 to 90 for 6-8 hours per day.      You may increase by 10 per day.  You may break it up into 2 or 3 sessions per day.      Use CPM for 2 weeks or until you are told to stop.   Diet - low sodium heart healthy  As directed    Do not put a pillow under the knee. Place it under the heel.  As directed    Driving restrictions  As directed    Comments:     No driving for 6 weeks   Increase activity slowly as tolerated  As directed    Lifting restrictions  As directed    Comments:     No lifting for 6 weeks   TED hose  As directed    Comments:     Use stockings (TED hose) for 3 weeks on both leg(s).  You may remove them at night for sleeping.      DISCHARGE MEDICATIONS:     Medication List    STOP taking these medications       aspirin EC 81 MG tablet      TAKE these medications       cholecalciferol 1000 UNITS tablet  Commonly known as:  VITAMIN D  Take 2,000 Units by mouth daily.     enoxaparin 40 MG/0.4ML injection  Commonly known as:  LOVENOX  Inject 0.4 mLs (40 mg total) into the skin daily.     MEPERITAB 50 MG tablet  Generic drug:  meperidine  Take 50 mg by mouth every 4 (four) hours as needed (for headache).     methocarbamol 500 MG tablet  Commonly known as:  ROBAXIN  Take 1-2 tablets (500-1,000 mg total) by mouth every 6 (six) hours as  needed.     ondansetron 4 MG tablet  Commonly known as:  ZOFRAN  Take 1 tablet (4 mg total) by mouth every 6 (six) hours as needed for nausea.     oxyCODONE 5 MG immediate release tablet  Commonly known as:  Oxy IR/ROXICODONE  Take 1-2 tablets (5-10 mg total) by mouth every 4 (four) hours as needed.     RABEprazole 20 MG tablet  Commonly known as:  ACIPHEX  Take 20 mg by mouth daily.     topiramate 25 MG tablet  Commonly known as:  TOPAMAX  Take 50 mg by mouth at  bedtime.     triamterene-hydrochlorothiazide 37.5-25 MG per tablet  Commonly known as:  MAXZIDE-25  Take 1 tablet by mouth daily.     vitamin B-12 1000 MCG tablet  Commonly known as:  CYANOCOBALAMIN  Take 1,000 mcg by mouth daily.     vitamin E 400 UNIT capsule  Take 400 Units by mouth daily.        FOLLOW UP VISIT:       Follow-up Information   Follow up with Raymon Mutton, MD. Call on 02/19/2013. [facility] [phone])    Specialty:  Orthopedic Surgery   Contact information:   [address] [place] [address] [phone]       DISPOSITION: HOME   CONDITION:  Good   Robyn Aguilar 02/08/2013, 8:24 AM

## 2013-02-08 NOTE — Progress Notes (Signed)
Orthopedic Tech Progress Note Patient Details:  Robyn Aguilar 06-24-1950 [id number] On cpm at 2:50 pm RLE increased 0-40 Patient ID: Robyn Aguilar, female   DOB: [birth year], 62 y.o.   MRN: [id number]   [name] 02/08/2013, 2:49 PM

## 2013-02-08 NOTE — Progress Notes (Signed)
SPORTS MEDICINE AND JOINT REPLACEMENT  Robyn Spurling, MD   Altamese Cabal, PA-C [address] [place], [place], [place]  [address]                             [phone]   PROGRESS NOTE  Subjective:  negative for Chest Pain  positive for Shortness of Breath  negative for Nausea/Vomiting   negative for Calf Pain  negative for Bowel Movement   Tolerating Diet: yes         Patient reports pain as 3 on 0-10 scale.    Objective: Vital signs in last 24 hours:   Patient Vitals for the past 24 hrs:  BP Temp Temp src Pulse Resp SpO2 Weight  02/08/13 1012 134/81 mmHg - - 107 - - -  02/08/13 0804 - - - - - 98 % -  02/08/13 0553 126/74 mmHg 98.6 F (37 C) Oral 109 20 95 % 89.948 kg (198 lb 4.8 oz)  02/08/13 0130 - - - 101 21 97 % -  02/07/13 2100 115/68 mmHg 98.3 F (36.8 C) Oral 104 20 100 % -  02/07/13 2021 - - - - - 99 % -    @flow {1959:LAST@   Intake/Output from previous day:   10/09 0701 - 10/10 0700 In: 840 [P.O.:840] Out: 4975 [Urine:4975]   Intake/Output this shift:       Intake/Output     10/09 0701 - 10/10 0700 10/10 0701 - 10/11 0700   P.O. 840    I.V. (mL/kg)     IV Piggyback     Total Intake(mL/kg) 840 (9.3)    Urine (mL/kg/hr) 4975 (2.3)    Total Output 4975     Net -4135             LABORATORY DATA:  Recent Labs  02/05/13 0445 02/06/13 0320 02/07/13 0650  WBC 7.0 8.5 7.7  HGB 12.6 13.3 11.2*  HCT 37.2 37.8 32.4*  PLT 255 225 208    Recent Labs  02/05/13 0445 02/06/13 0320 02/07/13 0650 02/08/13 0545  NA 135 132* 137 140  K 3.5 3.7 3.0* 3.2*  CL 97 94* 99 100  CO2 27 27 30 29   BUN 19 13 12 12   CREATININE 1.08 0.87 0.93 0.91  GLUCOSE 144* 127* 128* 135*  CALCIUM 8.6 8.7 8.7 8.9   Lab Results  Component Value Date   INR 0.87 01/25/2013    Examination:  General appearance: alert, cooperative and no distress Extremities: extremities normal, atraumatic, no cyanosis or edema and Homans sign is negative, no sign of DVT  Wound Exam:  clean, dry, intact   Drainage:  None: wound tissue dry  Motor Exam: EHL and FHL Intact  Sensory Exam: Deep Peroneal normal   Assessment:          ADDITIONAL DIAGNOSIS:  Principal Problem:   Acute respiratory failure with hypoxia Active Problems:   Pleural effusion   S/P right total knee arthroplasty   Hypertension   Hyperlipidemia   Migraines  Acute Blood Loss Anemia   Plan: Physical Therapy as ordered Weight Bearing as Tolerated (WBAT)  DVT Prophylaxis:  Lovenox  DISCHARGE PLAN: Home when medically stable           Robyn Aguilar 02/08/2013, 1:18 PM

## 2013-02-08 NOTE — Progress Notes (Signed)
Patient brought home CPAP in tonight.  Has a pressure of 6 cmH2O.   Wanted to bring her home CPAP in so that she wouldn't be charged for hospitals CPAP machine.  States that she does wear her CPAP every night.  Sats 97%.  RT will continue to monitor.

## 2013-02-09 LAB — BASIC METABOLIC PANEL
BUN: 17 mg/dL (ref 6–23)
CO2: 28 mEq/L (ref 19–32)
Calcium: 9.3 mg/dL (ref 8.4–10.5)
Chloride: 96 mEq/L (ref 96–112)
Creatinine, Ser: 0.9 mg/dL (ref 0.50–1.10)
GFR calc Af Amer: 77 mL/min — ABNORMAL LOW (ref >90)
GFR calc non Af Amer: 67 mL/min — ABNORMAL LOW (ref >90)
Glucose, Bld: 160 mg/dL — ABNORMAL HIGH (ref 70–99)
Potassium: 3.6 mEq/L (ref 3.5–5.1)
Sodium: 136 mEq/L (ref 135–145)

## 2013-02-09 LAB — MAGNESIUM: Magnesium: 2 mg/dL (ref 1.5–2.5)

## 2013-02-09 MED ORDER — AMOXICILLIN-POT CLAVULANATE 875-125 MG PO TABS: 1.0000 | ORAL_TABLET | Freq: Two times a day (BID) | ORAL | Status: AC

## 2013-02-09 MED ORDER — DSS 100 MG PO CAPS: 100.0000 mg | ORAL_CAPSULE | Freq: Two times a day (BID) | ORAL | Status: DC

## 2013-02-09 MED ORDER — METOPROLOL SUCCINATE ER 100 MG PO TB24
100.0000 mg | ORAL_TABLET | Freq: Every day | ORAL | Status: DC
  Administered 2013-02-09: 100 mg via ORAL
  Filled 2013-02-09 (×2): qty 1

## 2013-02-09 MED ORDER — POTASSIUM CHLORIDE ER 20 MEQ PO TBCR: 40.0000 meq | EXTENDED_RELEASE_TABLET | Freq: Every day | ORAL | Status: DC

## 2013-02-09 MED ORDER — FUROSEMIDE 40 MG PO TABS: 40.0000 mg | ORAL_TABLET | Freq: Two times a day (BID) | ORAL | Status: DC

## 2013-02-09 MED ORDER — RABEPRAZOLE SODIUM 20 MG PO TBEC: 20.0000 mg | DELAYED_RELEASE_TABLET | Freq: Every day | ORAL | Status: DC

## 2013-02-09 MED ORDER — POLYETHYLENE GLYCOL 3350 17 G PO PACK: 17.0000 g | PACK | Freq: Every day | ORAL | Status: DC

## 2013-02-09 NOTE — Discharge Summary (Signed)
Physician Discharge Summary  Robyn Aguilar NWG:[id number] DOB: 09/03/1950 DOA: 02/06/2013  PCP: Cala Bradford, MD  Admit date: 02/06/2013 Discharge date: 02/09/2013  Time spent: >30 minutes  Recommendations for Outpatient Follow-up:  1. BMET to follow electrolytes and renal function 2. Reassess BP and adjust medications as needed 3. Patient will follow with orthopedic service as instructed for follow up on her right knee arthroplasty  Discharge Diagnoses:  Principal Problem:   Acute respiratory failure with hypoxia Active Problems:   Pleural effusion   S/P right total knee arthroplasty   Hypertension   Hyperlipidemia   Migraines HCAP Diastolic heart failure (acute on chronic)  Discharge Condition: stable and improved. Will discharge home. Follow up with PCP in 1 week  Diet recommendation: low sodium diet  Filed Weights   02/07/13 0651 02/08/13 0553 02/09/13 0416  Weight: 91.128 kg (200 lb 14.4 oz) 89.948 kg (198 lb 4.8 oz) 88.089 kg (194 lb 3.2 oz)    History of present illness:  62 year old female with history of hypertension, hyperlipidemia (not on any medications) TIA x2, seizures who just underwent right total knee arthroplasty on 02/04/13 by Dr Valentina Gu, was just discharged home yesterday by the orthopedic service presented to ER for shortness of breath. History was obtained from the patient who stated that she did not realize that she was feeling short of breath until she got home and in the evening had difficulty breathing. She felt some chest heaviness and tightness due to shortness of breath. She denied any fevers or chills or any productive cough. Patient was DC'd on Lovenox daily. She denies any history of heart failure.  ER workup: Chest x-ray showed new elevation of right hemidiaphragm, bibasilar linear airspace opacities possibly at atelectasis although mild pneumonia might have similar appearance. CT angiogram of the chest showed no evidence for pulmonary  embolus., Small right pleural effusion, associated patchy airspace opacities.   Hospital Course:  1-Acute respiratory failure with hypoxia : Most likely a combination of diastolic heart failure exacerbation and healthcare associated pneumonia.  -patient received 48 hours of IV antibiotics; no will transition to augmentin and finish therapy by mouth (plan is to treat for 5 more days to complete a total of 8 days)  -patient will also continue lasix BID PO (40mg ) -follow daily weight and low sodium diet -continue IS  -CXR prior to discharge demonstrated improvement in vascular congestion and suggesting mild atelectasis  -Patient negative almost 8 L during hospitalization  2-diastolic heart failure: 2-D echo demonstrating grade 1 heart failure; ejection fraction preserved (65-70%), no wall motion abnormalities and just a small pericardial effusion.  -treatment as mentioned above. -heart healthy low sodium diet and daily weight -will follow with PCP in 1 week  3-S/P right total knee arthroplasty: will follow orthopedic service recommendations.   4-Hypertension: Stable. Will continue current antihypertensive regimen. HCTZ substitute for lasix  5-Hyperlipidemia: Continue statins.   6-hypokalemia: Secondary to diuresis. repleted and WNL at discharge  7-migraines: Continue Topamax and PRN demerol. Currently stable and without headaches.   8-OSA: continue CPAP   Procedures:  2-D echo: no wall motion abnormalities; no significant valvular defects; EF 65-70%. Grade 1 diastolic dysfunction appreciated. Mild elevation of pulmonary artery pressures.  Consultations:  Orthopedic service  Discharge Exam: Filed Vitals:   02/09/13 0416  BP: 112/67  Pulse: 126  Temp: 99.5 F (37.5 C)  Resp: 18   General: Alert, awake, oriented x3, afebrile, breathing more comfortable; no CP . Patient currently 96 sat on RA HEENT: No bruits,  no goiter.  Heart: S1 and S2 appreciated on exam; mild  tachycardia, no murmurs, no gallops or rubs.  Lungs: scattered rhonchi, no wheezing, good air movement; no crackles Abdomen: Soft, nontender, nondistended, positive bowel sounds.  Neuro: Grossly intact, nonfocal.  Extremities: RLE with dry dressing around knee; CPM intermittently applied (once on she useit for 6 straight hours). Trace edema bilaterally.   Discharge Instructions  Discharge Orders   Future Orders Complete By Expires   Diet - low sodium heart healthy  As directed    Discharge instructions  As directed    Comments:     Follow a low sodium diet (2-2.5 grams daily) Take medications as prescribed Arrange follow up with PCP in 1 week Use your Incentive Spirometry every 4 hours (at least 10 repetitions)       Medication List    STOP taking these medications       triamterene-hydrochlorothiazide 37.5-25 MG per tablet  Commonly known as:  MAXZIDE-25      TAKE these medications       amoxicillin-clavulanate 875-125 MG per tablet  Commonly known as:  AUGMENTIN  Take 1 tablet by mouth every 12 (twelve) hours.     cholecalciferol 1000 UNITS tablet  Commonly known as:  VITAMIN D  Take 2,000 Units by mouth daily.     DSS 100 MG Caps  Take 100 mg by mouth 2 (two) times daily.     enoxaparin 40 MG/0.4ML injection  Commonly known as:  LOVENOX  Inject 0.4 mLs (40 mg total) into the skin daily.     furosemide 40 MG tablet  Commonly known as:  LASIX  Take 1 tablet (40 mg total) by mouth 2 (two) times daily.     MEPERITAB 50 MG tablet  Generic drug:  meperidine  Take 50 mg by mouth every 4 (four) hours as needed (for headache).     methocarbamol 500 MG tablet  Commonly known as:  ROBAXIN  Take 1-2 tablets (500-1,000 mg total) by mouth every 6 (six) hours as needed.     metoprolol succinate 100 MG 24 hr tablet  Commonly known as:  TOPROL-XL  Take 100 mg by mouth daily. Take with or immediately following a meal.     ondansetron 4 MG tablet  Commonly known as:   ZOFRAN  Take 1 tablet (4 mg total) by mouth every 6 (six) hours as needed for nausea.     oxyCODONE 5 MG immediate release tablet  Commonly known as:  Oxy IR/ROXICODONE  Take 1-2 tablets (5-10 mg total) by mouth every 4 (four) hours as needed.     polyethylene glycol packet  Commonly known as:  MIRALAX / GLYCOLAX  Take 17 g by mouth daily.     Potassium Chloride ER 20 MEQ Tbcr  Take 40 mEq by mouth daily.     RABEprazole 20 MG tablet  Commonly known as:  ACIPHEX  Take 20 mg by mouth daily.     topiramate 25 MG tablet  Commonly known as:  TOPAMAX  Take 50 mg by mouth at bedtime.     vitamin B-12 1000 MCG tablet  Commonly known as:  CYANOCOBALAMIN  Take 1,000 mcg by mouth daily.     vitamin E 400 UNIT capsule  Take 400 Units by mouth daily.       Allergies  Allergen Reactions  . Codeine        Follow-up Information   Follow up with [facility]. (home health physical therapy)  Contact information:   [facility] [address], [address] [place], [place] [address]      Follow up with Cala Bradford, MD In 1 week.   Specialty:  Family Medicine   Contact information:   [address] [place] [address] [phone]        The results of significant diagnostics from this hospitalization (including imaging, microbiology, ancillary and laboratory) are listed below for reference.    Significant Diagnostic Studies: Dg Chest 2 View  02/08/2013   CLINICAL DATA:  Postoperative evaluation, shortness of breath.  EXAM: CHEST  2 VIEW  COMPARISON:  Chest radiograph ; CT chest 02/06/2013.  FINDINGS: Cervical spinal fusion hardware, incompletely visualized. Stable cardiac cardiac and mediastinal contours. Small right pleural effusion. Underlying opacities likely represent atelectasis. Minimal scarring and or atelectasis left lower lobe.  IMPRESSION: Stable small right pleural effusion and underlying opacities, likely atelectasis.   Electronically Signed    By: Annia Belt M.D.   On: 02/08/2013 07:55   Dg Chest 2 View  02/06/2013   *RADIOLOGY REPORT*  Clinical Data: Shortness of breath and chest pressure.  CHEST - 2 VIEW  Comparison: Chest radiograph performed 01/25/2013  Findings: There is elevation of the right hemidiaphragm.  Bibasilar linear airspace opacities likely reflect atelectasis, though mild pneumonia might have a similar appearance.  No pleural effusion or pneumothorax is seen.  The heart is normal in size; the mediastinal contour is within normal limits.  No acute osseous abnormalities are seen.  Cervical spinal fusion hardware is partially imaged.  IMPRESSION: New elevation of the right hemidiaphragm; bibasilar linear airspace opacities likely reflect atelectasis, though mild pneumonia might have a similar appearance, given the patient's symptoms.   Original Report Authenticated By: Tonia Ghent, M.D.   Dg Chest 2 View  01/25/2013   CLINICAL DATA:  Preop  EXAM: CHEST  2 VIEW  COMPARISON:  06/30/2008  FINDINGS: Cardiomediastinal silhouette is stable. No acute infiltrate or pleural effusion. No pulmonary edema. Bony thorax is unremarkable. Metallic fixation plate noted cervical spine.  IMPRESSION: No active cardiopulmonary disease.   Electronically Signed   By: Natasha Mead   On: 01/25/2013 10:50   Ct Angio Chest Pe W/cm &/or Wo Cm  02/06/2013   *RADIOLOGY REPORT*  Clinical Data: Sudden onset of upper chest pressure and heaviness; shortness of breath.  Recent total knee replacement.  CT ANGIOGRAPHY CHEST  Technique:  Multidetector CT imaging of the chest using the standard protocol during bolus administration of intravenous contrast. Multiplanar reconstructed images including MIPs were obtained and reviewed to evaluate the vascular anatomy.  Contrast: 71mL OMNIPAQUE IOHEXOL 350 MG/ML SOLN  Comparison: Chest radiograph performed earlier today at 12:13 a.m.  Findings: There is no evidence of pulmonary embolus.  A small right pleural effusion is  noted, with associated patchy airspace opacity, likely reflecting atelectasis.  Minimal left basilar atelectasis is noted.  There is no evidence of pneumothorax.  No masses are identified; no abnormal focal contrast enhancement is seen.  The mediastinum is unremarkable in appearance.  No pericardial effusion is seen.  The great vessels are grossly unremarkable in appearance.  No mediastinal lymphadenopathy is appreciated.  No axillary lymphadenopathy is seen.  The thyroid gland is unremarkable in appearance.  The visualized portions of the liver and spleen are unremarkable.  No acute osseous abnormalities are seen.  Cervical spinal fusion hardware is partially imaged.  IMPRESSION:  1.  No evidence of pulmonary embolus. 2.  Small right pleural  effusion, with associated patchy airspace opacity, likely reflecting atelectasis.  Minimal left basilar atelectasis noted.  Lungs otherwise clear.   Original Report Authenticated By: Tonia Ghent, M.D.    Microbiology: Recent Results (from the past 240 hour(s))  URINE CULTURE     Status: None   Collection Time    02/06/13 11:24 AM      Result Value Range Status   Specimen Description URINE, RANDOM   Final   Special Requests NONE   Final   Culture  Setup Time     Final   Value: 02/06/2013 12:25     Performed at [facility] Count     Final   Value: NO GROWTH     Performed at [facility]   Culture     Final   Value: NO GROWTH     Performed at [facility]   Report Status 02/07/2013 FINAL   Final  CULTURE, BLOOD (ROUTINE X 2)     Status: None   Collection Time    02/06/13 11:55 AM      Result Value Range Status   Specimen Description BLOOD LEFT ARM   Final   Special Requests BOTTLES DRAWN AEROBIC ONLY 5CC   Final   Culture  Setup Time     Final   Value: 02/06/2013 20:49     Performed at [facility]   Culture     Final   Value:        BLOOD CULTURE RECEIVED NO GROWTH TO DATE CULTURE WILL BE HELD FOR 5 DAYS  BEFORE ISSUING A FINAL NEGATIVE REPORT     Performed at [facility]   Report Status PENDING   Incomplete  CULTURE, BLOOD (ROUTINE X 2)     Status: None   Collection Time    02/06/13 12:00 PM      Result Value Range Status   Specimen Description BLOOD LEFT HAND   Final   Special Requests BOTTLES DRAWN AEROBIC ONLY 5CC   Final   Culture  Setup Time     Final   Value: 02/06/2013 20:49     Performed at [facility]   Culture     Final   Value:        BLOOD CULTURE RECEIVED NO GROWTH TO DATE CULTURE WILL BE HELD FOR 5 DAYS BEFORE ISSUING A FINAL NEGATIVE REPORT     Performed at [facility]   Report Status PENDING   Incomplete     Labs: Basic Metabolic Panel:  Recent Labs Lab 02/05/13 0445 02/06/13 0320 02/07/13 0650 02/08/13 0545 02/09/13 0625  NA 135 132* 137 140 136  K 3.5 3.7 3.0* 3.2* 3.6  CL 97 94* 99 100 96  CO2 27 27 30 29 28   GLUCOSE 144* 127* 128* 135* 160*  BUN 19 13 12 12 17   CREATININE 1.08 0.87 0.93 0.91 0.90  CALCIUM 8.6 8.7 8.7 8.9 9.3  MG  --   --   --   --  2.0   CBC:  Recent Labs Lab 02/05/13 0445 02/06/13 0320 02/07/13 0650  WBC 7.0 8.5 7.7  NEUTROABS  --  6.9  --   HGB 12.6 13.3 11.2*  HCT 37.2 37.8 32.4*  MCV 82.9 81.1 81.4  PLT 255 225 208   Cardiac Enzymes:  Recent Labs Lab 02/06/13 0800 02/06/13 1400 02/06/13 2000  CKTOTAL 297* 261* 196*  CKMB 4.5* 4.0 3.1  TROPONINI <0.30 <0.30 <0.30  BNP: BNP (last 3 results)  Recent Labs  02/06/13 0800  PROBNP 347.7*    Signed:  Anelis Hrivnak  [facility] 02/09/2013, 11:42 AM

## 2013-02-09 NOTE — Progress Notes (Signed)
After giving IV lasix patient stated she suddenly felt really cold. Soon after she stated that, patient begin to shiver. Gave patient two warm blankets and took vital signs. Vital signs were stable. Patient begin to feel more warm and shortly thereafter wanted to eat her lunch. Notified attending MD. Will continue to monitor to end of shift.

## 2013-02-09 NOTE — Progress Notes (Signed)
Aviyana Alston-Pettiford to be D/C'd Home per MD order.  Discussed with the patient and all questions fully answered.    Medication List    STOP taking these medications       triamterene-hydrochlorothiazide 37.5-25 MG per tablet  Commonly known as:  MAXZIDE-25      TAKE these medications       amoxicillin-clavulanate 875-125 MG per tablet  Commonly known as:  AUGMENTIN  Take 1 tablet by mouth every 12 (twelve) hours.     cholecalciferol 1000 UNITS tablet  Commonly known as:  VITAMIN D  Take 2,000 Units by mouth daily.     DSS 100 MG Caps  Take 100 mg by mouth 2 (two) times daily.     enoxaparin 40 MG/0.4ML injection  Commonly known as:  LOVENOX  Inject 0.4 mLs (40 mg total) into the skin daily.     furosemide 40 MG tablet  Commonly known as:  LASIX  Take 1 tablet (40 mg total) by mouth 2 (two) times daily.     MEPERITAB 50 MG tablet  Generic drug:  meperidine  Take 50 mg by mouth every 4 (four) hours as needed (for headache).     methocarbamol 500 MG tablet  Commonly known as:  ROBAXIN  Take 1-2 tablets (500-1,000 mg total) by mouth every 6 (six) hours as needed.     metoprolol succinate 100 MG 24 hr tablet  Commonly known as:  TOPROL-XL  Take 100 mg by mouth daily. Take with or immediately following a meal.     ondansetron 4 MG tablet  Commonly known as:  ZOFRAN  Take 1 tablet (4 mg total) by mouth every 6 (six) hours as needed for nausea.     oxyCODONE 5 MG immediate release tablet  Commonly known as:  Oxy IR/ROXICODONE  Take 1-2 tablets (5-10 mg total) by mouth every 4 (four) hours as needed.     polyethylene glycol packet  Commonly known as:  MIRALAX / GLYCOLAX  Take 17 g by mouth daily.     Potassium Chloride ER 20 MEQ Tbcr  Take 40 mEq by mouth daily.     RABEprazole 20 MG tablet  Commonly known as:  ACIPHEX  Take 20 mg by mouth daily.     topiramate 25 MG tablet  Commonly known as:  TOPAMAX  Take 50 mg by mouth at bedtime.     vitamin B-12  1000 MCG tablet  Commonly known as:  CYANOCOBALAMIN  Take 1,000 mcg by mouth daily.     vitamin E 400 UNIT capsule  Take 400 Units by mouth daily.        VVS, Skin clean, dry and intact without evidence of skin break down, no evidence of skin tears noted. IV catheter discontinued intact. Site without signs and symptoms of complications. Dressing and pressure applied.  An After Visit Summary was printed and given to the patient. Patient escorted via WC, and D/C home via private auto.  Jaeli Grubb 02/09/2013 4:41 PM

## 2013-02-09 NOTE — Progress Notes (Signed)
Patient ID: Zakira Ressel, female   DOB: Aug 23, 1950, 62 y.o.   MRN: [id number]  PROGRESS NOTE  Subjective:  positive for Chest tightness, denies chest pain  negative for Shortness of Breath  negative for Nausea/Vomiting   negative for Calf Pain  negative for Bowel Movement   Tolerating Diet: yes         Patient reports pain as 5 on 0-10 scale.       Objective: Vital signs in last 24 hours:   Patient Vitals for the past 24 hrs:  BP Temp Temp src Pulse Resp SpO2 Weight  02/09/13 0840 - - - - - 96 % -  02/09/13 0416 112/67 mmHg 99.5 F (37.5 C) Oral 126 18 96 % 88.089 kg (194 lb 3.2 oz)  02/08/13 2151 - - - 108 18 96 % -  02/08/13 2045 129/65 mmHg 98.3 F (36.8 C) Oral 117 18 95 % -  02/08/13 1436 139/83 mmHg 98.6 F (37 C) Oral 99 20 99 % -      Intake/Output from previous day:   10/10 0701 - 10/11 0700 In: 480 [P.O.:480] Out: 3100 [Urine:3100]   Intake/Output this shift:       Intake/Output     10/10 0701 - 10/11 0700 10/11 0701 - 10/12 0700   P.O. 480    Total Intake(mL/kg) 480 (5.4)    Urine (mL/kg/hr) 3100 (1.5)    Total Output 3100     Net -2620             LABORATORY DATA:  Recent Labs  02/05/13 0445 02/06/13 0320 02/07/13 0650  WBC 7.0 8.5 7.7  HGB 12.6 13.3 11.2*  HCT 37.2 37.8 32.4*  PLT 255 225 208    Recent Labs  02/05/13 0445 02/06/13 0320 02/07/13 0650 02/08/13 0545 02/09/13 0625  NA 135 132* 137 140 136  K 3.5 3.7 3.0* 3.2* 3.6  CL 97 94* 99 100 96  CO2 27 27 30 29 28   BUN 19 13 12 12 17   CREATININE 1.08 0.87 0.93 0.91 0.90  GLUCOSE 144* 127* 128* 135* 160*  CALCIUM 8.6 8.7 8.7 8.9 9.3   Lab Results  Component Value Date   INR 0.87 01/25/2013    Examination:  General appearance: alert, cooperative and no distress  Wound Exam: dressing in place  Drainage:  No drainage noted through dressing  Motor Exam: grossly intact bilateral LE  Sensory Exam: grossly intact bilateral LE  Vascular Exam:  Normal  Assessment:          ADDITIONAL DIAGNOSIS:  Principal Problem:   Acute respiratory failure with hypoxia Active Problems:   Pleural effusion   S/P right total knee arthroplasty   Hypertension   Hyperlipidemia   Migraines     Plan: Physical Therapy as ordered Weight Bearing as Tolerated (WBAT)  DVT Prophylaxis:  Lovenox  DISCHARGE PLAN: Home when stable from medical standpoint           Rayhana Slider JAMES 02/09/2013, 11:08 AM

## 2013-02-09 NOTE — Progress Notes (Signed)
   CARE MANAGEMENT NOTE 02/09/2013  Patient:  [facility]   Account Number:  [id number]  Date Initiated:  02/09/2013  Documentation initiated by:  [facility]  Subjective/Objective Assessment:   adm: PNA     Action/Plan:   discharge planning   Anticipated DC Date:  02/09/2013   Anticipated DC Plan:  HOME W HOME HEALTH SERVICES      DC Planning Services  CM consult      [facility] Choice  Resumption Of Svcs/PTA Provider   Choice offered to / List presented to:        DME agency  [facility]     HH arranged  HH-2 PT      [facility] agency  [facility]   Status of service:  Completed, signed off Medicare Important Message given?   (If response is "NO", the following Medicare IM given date fields will be blank) Date Medicare IM given:   Date Additional Medicare IM given:    Discharge Disposition:  HOME W HOME HEALTH SERVICES  Per UR Regulation:    If discussed at Long Length of Stay Meetings, dates discussed:    Comments:  02/09/13 10:38 CM spoke with pt in room who states she has been set up with [place] but home therapy had not yet begun bc she was admitted for PNA onn tuesday, 02/05/13.  Called Genevieve Norlander to notify of pt's discharge today.  Pt states she has CPM machine at home, walker and 3n1.  No other CM needs were communicated, Freddy Jaksch, BSN, [place] [phone].

## 2013-02-12 LAB — CULTURE, BLOOD (ROUTINE X 2)
Culture: NO GROWTH
Culture: NO GROWTH

## 2013-03-07 ENCOUNTER — Other Ambulatory Visit: Payer: Self-pay

## 2013-03-08 ENCOUNTER — Ambulatory Visit (INDEPENDENT_AMBULATORY_CARE_PROVIDER_SITE_OTHER): Payer: BC Managed Care – PPO | Admitting: Interventional Cardiology

## 2013-03-08 ENCOUNTER — Encounter: Payer: Self-pay | Admitting: Interventional Cardiology

## 2013-03-08 VITALS — BP 137/70 | HR 68 | Ht 65.0 in | Wt 186.0 lb

## 2013-03-08 DIAGNOSIS — R0789 Other chest pain: Secondary | ICD-10-CM

## 2013-03-08 DIAGNOSIS — I5032 Chronic diastolic (congestive) heart failure: Secondary | ICD-10-CM

## 2013-03-08 DIAGNOSIS — I10 Essential (primary) hypertension: Secondary | ICD-10-CM

## 2013-03-08 DIAGNOSIS — E785 Hyperlipidemia, unspecified: Secondary | ICD-10-CM

## 2013-03-08 NOTE — Progress Notes (Signed)
Patient ID: Robyn Aguilar, female   DOB: 15-Nov-1950, 62 y.o.   MRN: [id number]   Date: 03/08/2013 ID: Robyn Aguilar, DOB June 20, 1950, MRN [id number] PCP: Cala Bradford, MD  Reason: Diastolic heart failure  ASSESSMENT;  1. Chronic diastolic heart failure. Acute decompensation in October 2014 associated with right total knee replacement. Etiology for diastolic heart failure seems to include moderate left ventricular hypertrophy (echocardiogram) and also in setting of chest tightness coronary disease needs to be excluded. 2. Chest pressure, intermittent, noticeable over the past 4-6 weeks. Workup by Dr. Candace Cruise 7 years ago revealed normal coronary arteries by angiography 3. Long-standing hypertension, according to the patient under excellent control. She advocates medication compliance. At the time of catheterization in 2007, blood pressures were out of control with an LVEDP of 17 mmHg and systolic pressure of 167 mmHg 4. Obesity 5. Hyperlipidemia 6. History of transient ischemic attack 7. Moderate left ventricular hypertrophy with grade 1 diastolic dysfunction by echo October 2014. Likely do to poor blood pressure control but cannot totally exclude the possibility of genetic hypertrophic nonobstructive cardiomyopathy  PLAN:  1. Medical therapy with beta blocker therapy to slow heart rate and diuretic therapy to decrease intravascular volume for symptomatic improvement (no changes made in current regimen).   SUBJECTIVE: Robyn Aguilar is a 62 y.o. female who is by Dr. Laurann Montana for evaluation of diastolic heart failure. The patient began noticing dyspnea on exertion this past summer. She would note dyspnea with climbing stairs or she had right total knee replacement in early October. Leading up to the hospitalization she is noted weight gain. Upon discharge from the hospital, she had orthopnea and swelling. She stayed home approximately 6 hours and then  return to the emergency room because of orthopnea and smothering. The hospitalization resulted in a 15 pound diuresis this significantly improved her dyspnea. Since diuresis she has noted intermittent tightness in her chest. An echocardiogram performed in the hospital demonstrated moderate left ventricular hypertrophy, EF of 70%, and grade 1 diastolic dysfunction. Prior cardiac evaluation by Dr. Meade Maw in 2007 demonstrated a falsely positive stress nuclear study followed by coronary angiography that revealed widely patent arteries.  The patient has long-standing hypertension. She is relatively sedentary. She had the case excellent blood pressure control, diet, and medication compliance. At the time of catheterization 7 years ago, the left ventricular pressures were 167/17 mmHg which is clearly higher than normal.   Allergies  Allergen Reactions  . Codeine Other (See Comments)  . Latex Rash    Current Outpatient Prescriptions on File Prior to Visit  Medication Sig Dispense Refill  . metoprolol succinate (TOPROL-XL) 100 MG 24 hr tablet Take 100 mg by mouth daily. Take with or immediately following a meal.      . oxyCODONE (OXY IR/ROXICODONE) 5 MG immediate release tablet Take 1-2 tablets (5-10 mg total) by mouth every 4 (four) hours as needed.  90 tablet  0  . RABEprazole (ACIPHEX) 20 MG tablet Take 20 mg by mouth at bedtime.       . topiramate (TOPAMAX) 25 MG tablet Take two tabs at bedtime       No current facility-administered medications on file prior to visit.    Past Medical History  Diagnosis Date  . Migraine   . High cholesterol     but doesn't take any meds  . Allergic rhinitis   . TIA (transient ischemic attack)   . Migraines   . Hypertension     takes Metoprolol and maxzide  daily  . Sleep apnea     sleep study in epic from 2014-uses CPAP  . Pneumonia     at age 66  . History of migraine     last one 6-33months ago;takes Topamax nightly  . Seizures     hx of;last  one 68yrs ago;no meds required;states it was brought on by med;only one ever had  . Dizziness     takes Antivert prn  . Stroke     2 TIA  . Arthritis     knees and neck  . Joint pain   . Joint swelling   . GERD (gastroesophageal reflux disease)     takes Aciphex prn  . History of colon polyps   . Cataracts, bilateral     immature   . Shortness of breath 02/06/2013    Past Surgical History  Procedure Laterality Date  . Carpel tunnel Right 1988  . Knee surgery Left 2011    replacement  . Disc surgery  1996  . Hernia repair  1987  . Abdominal hysterectomy  1991  . Colonoscopy    . Esophagogastroduodenoscopy    . Total knee arthroplasty Right   . Total knee arthroplasty Right 02/04/2013    Procedure: RIGHT TOTAL KNEE ARTHROPLASTY;  Surgeon: Dannielle Huh, MD;  Location: [facility] OR;  Service: Orthopedics;  Laterality: Right;    History   Social History  . Marital Status: Married    Spouse Name: N/A    Number of Children: 2  . Years of Education: college   Occupational History  .     Social History Main Topics  . Smoking status: Never Smoker   . Smokeless tobacco: Never Used  . Alcohol Use: No  . Drug Use: No  . Sexual Activity: Yes    Birth Control/ Protection: Surgical   Other Topics Concern  . Not on file   Social History Narrative  . No narrative on file    Family History  Problem Relation Age of Onset  . Prostate cancer Other     ROS: Multiple medication intolerances. Denies edema. Endorses medication compliance. Denies palpitations. Denies orthopnea and PND. No exertional chest discomfort. No history of syncope, or abdominal swelling. Prior history of TIA. Other systems negative for complaints.  OBJECTIVE: BP 137/70  Pulse 68  Ht 5\' 5"  (1.651 m)  Wt 186 lb (84.369 kg)  BMI 30.95 kg/m2,  General: No acute distress, moderately obese  HEENT: normal without dullness or pallor  Neck: JVD flat with the patient sitting. Carotids 2+ upstroke with no bruits    Chest: Clear anteriorly and posteriorly  Cardiac: Murmur: Absent. Gallop: Absent. Rhythm: Regular. Other: Normal  Abdomen: Bruit: Absent for bruits. Pulsation: Absent  Extremities: Edema: Absent. Pulses: 2+  Neuro: Normal  Psych: Anxious  ECG: Normal sinus rhythm with left axis deviation

## 2013-03-08 NOTE — Patient Instructions (Signed)
Your physician recommends that you continue on your current medications as directed. Please refer to the Current Medication list given to you today.  Your physician has requested that you have a lexiscan myoview. For further information please visit [email or web address] Please follow instruction sheet, as given.  Follow up pending results of study

## 2013-03-15 ENCOUNTER — Telehealth: Payer: Self-pay | Admitting: Neurology

## 2013-03-15 NOTE — Telephone Encounter (Signed)
The last couple of weeks she has been taking the topiramate before bed to prevent headaches but she is still getting headaches. On Oct. 6 she had a knee replacement and she is taking two new meds and she is not sure if this is what is messing with the topiramate. Last night she didn't take the topiramate to see if that was the problem and she did not experience a headache. She states the headaches are not long but they are still there. She wants a call back.

## 2013-03-20 ENCOUNTER — Encounter (HOSPITAL_COMMUNITY): Payer: BC Managed Care – PPO

## 2013-03-21 NOTE — Telephone Encounter (Signed)
Which ar the 2 new drugs - are these pain pills- than its not surprising they treat her hedaches as well, but will not do so once she gets no more refills from the surgeon.  I left a VM asking for the new meds by name and read her current list to her .

## 2013-03-21 NOTE — Telephone Encounter (Signed)
Called patient to see what two medications that she was taking now which is Lasix and Potassium chloride. Patient states that she is no longer taking any pain medication. Patient also states that she is trying to take the two medications which is lasix and topirmate at different times to see if this will stop the headaches.  Please advise.

## 2013-03-22 NOTE — Telephone Encounter (Signed)
I can see no correlation between lasix and topiramate causing headaches.  Topiramate is a mild diuretic, lasix is a stronger one.   Patient should take topiramate as directed 25 mg tab 2 at night po.

## 2013-03-25 NOTE — Telephone Encounter (Signed)
Called patient to inform her what Dr. Vickey Huger  Stated that the topiramate and the lasix would not cause headaches and that she should continue to take the topiramate as directed 25 mg 2 tab po at bedtime. Patient stated that she was taking it already and that she would continue for about 2 weeks and then if it doesn't get better then she would call back and make an appt.

## 2013-03-31 ENCOUNTER — Other Ambulatory Visit: Payer: Self-pay | Admitting: Interventional Cardiology

## 2013-04-02 ENCOUNTER — Telehealth: Payer: Self-pay

## 2013-04-02 NOTE — Telephone Encounter (Signed)
lmom for pt toreturn call.called to verify pt potassium dosage

## 2013-04-02 NOTE — Telephone Encounter (Signed)
Can you please verify how patient should be taking this? Patient stated that she takes 0.5 tablets bid, and that Dr Katrinka Blazing was aware of that, but I don't see it on her recent visit. Please advise. Thanks, MI

## 2013-04-03 ENCOUNTER — Encounter: Payer: Self-pay | Admitting: Cardiology

## 2013-04-03 ENCOUNTER — Ambulatory Visit (HOSPITAL_COMMUNITY): Payer: BC Managed Care – PPO | Attending: Cardiology | Admitting: Radiology

## 2013-04-03 VITALS — BP 145/94 | Ht 65.0 in | Wt 180.0 lb

## 2013-04-03 DIAGNOSIS — R0789 Other chest pain: Secondary | ICD-10-CM

## 2013-04-03 DIAGNOSIS — I5032 Chronic diastolic (congestive) heart failure: Secondary | ICD-10-CM

## 2013-04-03 DIAGNOSIS — E785 Hyperlipidemia, unspecified: Secondary | ICD-10-CM | POA: Insufficient documentation

## 2013-04-03 DIAGNOSIS — R0989 Other specified symptoms and signs involving the circulatory and respiratory systems: Secondary | ICD-10-CM | POA: Insufficient documentation

## 2013-04-03 DIAGNOSIS — R0609 Other forms of dyspnea: Secondary | ICD-10-CM | POA: Insufficient documentation

## 2013-04-03 DIAGNOSIS — R42 Dizziness and giddiness: Secondary | ICD-10-CM | POA: Insufficient documentation

## 2013-04-03 DIAGNOSIS — Z8673 Personal history of transient ischemic attack (TIA), and cerebral infarction without residual deficits: Secondary | ICD-10-CM | POA: Insufficient documentation

## 2013-04-03 DIAGNOSIS — R0602 Shortness of breath: Secondary | ICD-10-CM

## 2013-04-03 DIAGNOSIS — I10 Essential (primary) hypertension: Secondary | ICD-10-CM | POA: Insufficient documentation

## 2013-04-03 MED ORDER — REGADENOSON 0.4 MG/5ML IV SOLN
0.4000 mg | Freq: Once | INTRAVENOUS | Status: AC
  Administered 2013-04-03: 0.4 mg via INTRAVENOUS

## 2013-04-03 MED ORDER — TECHNETIUM TC 99M SESTAMIBI GENERIC - CARDIOLITE
33.0000 | Freq: Once | INTRAVENOUS | Status: AC | PRN
  Administered 2013-04-03: 33 via INTRAVENOUS

## 2013-04-03 MED ORDER — TECHNETIUM TC 99M SESTAMIBI GENERIC - CARDIOLITE
11.0000 | Freq: Once | INTRAVENOUS | Status: AC | PRN
  Administered 2013-04-03: 11 via INTRAVENOUS

## 2013-04-03 NOTE — Telephone Encounter (Signed)
Pt came into the office for nuclear study.spoke with pt she will go home and check her bottle of potassium to verify dosage and call back

## 2013-04-03 NOTE — Progress Notes (Signed)
[facility] SITE 3 NUCLEAR MED [address] [place], [place] [address] [phone]    Cardiology Nuclear Med Robyn Aguilar is a 62 y.o. female     MRN : [id number]     DOB: Dec 21, 1950  Procedure Date: 04/03/2013  Nuclear Med Background Indication for Stress Test:  Evaluation for Ischemia History:  2007: Cath, MPI: EF: 73% cath followed false (+) test H/O Seizures, 10/14 ECHO: EF: 60-65% Cardiac Risk Factors: Hypertension, Lipids and TIA  Symptoms:  Chest Tightness, Dizziness, DOE and SOB   Nuclear Pre-Procedure Caffeine/Decaff Intake:  None > 12 hrs NPO After: 5:30pm   Lungs:  clear O2 Sat: 98% on room air. IV 0.9% NS with Angio Cath:  22g  IV Site: R Antecubital x 1, tolerated well IV Started by:  Irean Hong, RN  Chest Size (in):  38 Cup Size: D  Height: 5\' 5"  (1.651 m)  Weight:  180 lb (81.647 kg)  BMI:  Body mass index is 29.95 kg/(m^2). Tech Comments:  Last metoprolol 24 hrs ago    Nuclear Med Study 1 or 2 day study: 1 day  Stress Test Type:  Lexiscan  Reading MD: Verdis Prime, MD  Order Authorizing Provider:  Verdis Prime, III  Resting Radionuclide: Technetium 4m Sestamibi  Resting Radionuclide Dose: 11.0 mCi   Stress Radionuclide:  Technetium 53m Sestamibi  Stress Radionuclide Dose: 33.0 mCi           Stress Protocol Rest HR: 62 Stress HR: 106  Rest BP: 145/94 Stress BP: 154/89  Exercise Time (min): n/a METS: n/a   Predicted Max HR: 157 bpm % Max HR: 39.49 bpm Rate Pressure Product: 8990   Dose of Adenosine (mg):  n/a Dose of Lexiscan: 0.4 mg  Dose of Atropine (mg): n/a Dose of Dobutamine: n/a mcg/kg/min (at max HR)  Stress Test Technologist: Milana Na, EMT-P  Nuclear Technologist:  Domenic Polite, CNMT     Rest Procedure:  Myocardial perfusion imaging was performed at rest 45 minutes following the intravenous administration of Technetium 69m Sestamibi. Rest ECG: NSR - Normal EKG  Stress Procedure:  The patient  received IV Lexiscan 0.4 mg over 15-seconds.  Technetium 72m Sestamibi injected at 30-seconds. This patient had sob and chest tightness with the Lexiscan injection. Quantitative spect images were obtained after a 45 minute delay. Stress ECG: No significant change from baseline ECG  QPS Raw Data Images:  Mild breast attenuation.  Normal left ventricular size. Stress Images:  Normal homogeneous uptake in all areas of the myocardium. Rest Images:  Normal homogeneous uptake in all areas of the myocardium. Subtraction (SDS):  No evidence of ischemia. Transient Ischemic Dilatation (Normal <1.22):  0.08 Lung/Heart Ratio (Normal <0.45):  0.40  Quantitative Gated Spect Images QGS EDV:  49 ml QGS ESV:  8 ml  Impression Exercise Capacity:  Lexiscan with no exercise. BP Response:  Normal blood pressure response. Clinical Symptoms:  Mild chest pain/dyspnea. ECG Impression:  No significant ST segment change suggestive of ischemia. Comparison with Prior Nuclear Study: No images to compare  Overall Impression:  Normal stress nuclear study.  LV Ejection Fraction: 83%.  LV Wall Motion:  NL LV Function; NL Wall Motion

## 2013-04-05 ENCOUNTER — Telehealth: Payer: Self-pay

## 2013-04-05 NOTE — Telephone Encounter (Signed)
Message copied by Jarvis Newcomer on Fri Apr 05, 2013  3:34 PM ------      Message from: Verdis Prime      Created: Fri Apr 05, 2013  2:17 PM       The stress test is essentially normal. No further specific workup at this time unless the patient feels her symptoms are progressing or intolerable. ------

## 2013-04-05 NOTE — Telephone Encounter (Signed)
pt aware of nuclear study.  The stress test is essentially normal. No further specific workup at this time unless the patient feels her symptoms are progressing or intolerable pt verbalized understanding.

## 2013-04-12 ENCOUNTER — Telehealth: Payer: Self-pay | Admitting: Interventional Cardiology

## 2013-04-12 NOTE — Telephone Encounter (Signed)
New Message   Pt calling to make sure that it is ok for her to take aleve or ibuprofen with the current medications she is taking. Please call to discuss.

## 2013-04-12 NOTE — Telephone Encounter (Signed)
pt sts that she is having shoulder pain with some inflammtion and would like to take ibruprofen for pain.ok for pt to take ibrubrofen for the next coule of days there is no interaction with pt current meds.pt verbalized understanding.

## 2013-04-22 ENCOUNTER — Telehealth: Payer: Self-pay | Admitting: *Deleted

## 2013-04-22 NOTE — Telephone Encounter (Signed)
pt sts that she is still ecperinceing some sob and chest pressure.per Dr.smith ok to schedule pt in Jan 2015.pt instructed that if symptoms increase to call the offiec for an earlier appt with another provider. pt verbalized understanding.

## 2013-04-22 NOTE — Telephone Encounter (Signed)
Spoke with pt who reports lexiscan was OK but she is continuing to experience shortness of breath. This usually occurs when climbing stairs but other times can occur at rest. Reports chest pressure every now and then. Has been walking some but this is limited due to recent knee surgery. She is requesting Dr. Michaelle Copas advice regarding her symptoms.  She was to follow up as needed but would like to know if she needs another office visit or further testing.

## 2013-05-28 ENCOUNTER — Ambulatory Visit: Payer: BC Managed Care – PPO | Admitting: Interventional Cardiology

## 2013-07-01 ENCOUNTER — Telehealth: Payer: Self-pay | Admitting: Interventional Cardiology

## 2013-07-01 NOTE — Telephone Encounter (Signed)
Called wanting an earlier app than April 13.  Appointment this week for 3/5 was cancelled by our office.  States she has been having some SOB, and a "funny feeling" in her chest.  Is scheduled for a colonoscopy and doesn't want to be sedated with the feelings she is having.  Advised would send request to his nurse and someone would call her back.

## 2013-07-01 NOTE — Telephone Encounter (Signed)
New Problem:  Pt is requesting a sooner appt w/ Dr. [name]. Pt wants to speak to [facility]

## 2013-07-02 NOTE — Telephone Encounter (Signed)
pt appt made for 07/03/13 @3pm .pt aware and verbalized understanding.

## 2013-07-03 ENCOUNTER — Ambulatory Visit (INDEPENDENT_AMBULATORY_CARE_PROVIDER_SITE_OTHER): Payer: BC Managed Care – PPO | Admitting: Interventional Cardiology

## 2013-07-03 ENCOUNTER — Encounter: Payer: Self-pay | Admitting: Interventional Cardiology

## 2013-07-03 VITALS — BP 146/85 | HR 60 | Ht 65.0 in | Wt 185.0 lb

## 2013-07-03 DIAGNOSIS — R0789 Other chest pain: Secondary | ICD-10-CM

## 2013-07-03 DIAGNOSIS — I10 Essential (primary) hypertension: Secondary | ICD-10-CM

## 2013-07-03 DIAGNOSIS — E785 Hyperlipidemia, unspecified: Secondary | ICD-10-CM

## 2013-07-03 DIAGNOSIS — I5032 Chronic diastolic (congestive) heart failure: Secondary | ICD-10-CM

## 2013-07-03 NOTE — Progress Notes (Addendum)
Patient ID: Robyn Aguilar, female   DOB: October 26, 1950, 63 y.o.   MRN: [id number]    [address], [address] [place], [place]  [address] Phone: [phone] Fax:  [phone]  Date:  07/03/2013   ID:  Robyn Aguilar, DOB 01/06/1951, MRN [id number]  PCP:  Vidal Schwalbe, MD   ASSESSMENT:  1. Diastolic dysfunction/heart failure, stable 2. Blood pressure and excellent control 3. Insomnia 4. Headaches and I've advised her to discuss with her primary physician  PLAN:  1. Continue current medical regimen. Consider consolidating furosemide to 40 mg once a day instead of 20 mg twice a day 2. No specific change in therapy at this time   SUBJECTIVE: Robyn Aguilar is a 63 y.o. female who had heart failure following an orthopedic procedure. No chest pain. Cardiac workup including echo and nuclear study were unremarkable. She denies lower extremity swelling. No palpitations episodes of chest pain.   Wt Readings from Last 3 Encounters:  07/03/13 185 lb (83.915 kg)  04/03/13 180 lb (81.647 kg)  03/08/13 186 lb (84.369 kg)     Past Medical History  Diagnosis Date  . Migraine   . High cholesterol     but doesn't take any meds  . Allergic rhinitis   . TIA (transient ischemic attack)   . Migraines   . Hypertension     takes Metoprolol and maxzide daily  . Sleep apnea     sleep study in epic from 2014-uses CPAP  . Pneumonia     at age 23  . History of migraine     last one 6-73months ago;takes Topamax nightly  . Seizures     hx of;last one 66yrs ago;no meds required;states it was brought on by med;only one ever had  . Dizziness     takes Antivert prn  . Stroke     2 TIA  . Arthritis     knees and neck  . Joint pain   . Joint swelling   . GERD (gastroesophageal reflux disease)     takes Aciphex prn  . History of colon polyps   . Cataracts, bilateral     immature   . Shortness of breath 02/06/2013    Current Outpatient Prescriptions    Medication Sig Dispense Refill  . aspirin 81 MG tablet Take 3 tabs daily      . Cholecalciferol (VITAMIN D3) 3000 UNITS TABS Take by mouth. Take 2 tabs daily equal(2000mg ) daily      . furosemide (LASIX) 40 MG tablet Take 1/2 tab  Twice a day      . meperidine (DEMEROL) 50 MG tablet Take 50 mg by mouth every 4 (four) hours as needed for severe pain. Pt takes this for migraines      . metoprolol succinate (TOPROL-XL) 100 MG 24 hr tablet Take 100 mg by mouth daily. Take with or immediately following a meal.      . potassium chloride SA (K-DUR,KLOR-CON) 20 MEQ tablet Take 0.5 tablets (10 mEq total) by mouth 2 (two) times daily.  30 tablet  11  . RABEprazole (ACIPHEX) 20 MG tablet Take 20 mg by mouth at bedtime.       . topiramate (TOPAMAX) 25 MG tablet Take two tabs at bedtime       No current facility-administered medications for this visit.    Allergies:    Allergies  Allergen Reactions  . Codeine Other (See Comments)  . Latex Rash    Social History:  The patient  reports that she has never smoked. She has never used smokeless tobacco. She reports that she does not drink alcohol or use illicit drugs.   ROS:  Please see the history of present illness.      All other systems reviewed and negative.   OBJECTIVE: VS:  BP 146/85  Pulse 60  Ht 5\' 5"  (1.651 m)  Wt 185 lb (83.915 kg)  BMI 30.79 kg/m2 Well nourished, well developed, in no acute distress, appears healthy HEENT: normal Neck: JVD flat. Carotid bruit absent  Cardiac:  normal S1, S2; RRR; no murmur Lungs:  clear to auscultation bilaterally, no wheezing, rhonchi or rales Abd: soft, nontender, no hepatomegaly Ext: Edema absent. Pulses 2+ Skin: warm and dry Neuro:  CNs 2-12 intact, no focal abnormalities noted  EKG:  NSR and normal tracing.    Signed, Illene Labrador III, MD 07/03/2013 3:59 PM

## 2013-07-03 NOTE — Patient Instructions (Signed)
Your physician recommends that you continue on your current medications as directed. Please refer to the Current Medication list given to you today.  Monitor your blood pressure at home. Call the office if your blood pressure is consistently over 140/90.  Your physician wants you to follow-up in: 6-8 months You will receive a reminder letter in the mail two months in advance. If you don't receive a letter, please call our office to schedule the follow-up appointment.

## 2013-07-04 ENCOUNTER — Ambulatory Visit: Payer: BC Managed Care – PPO | Admitting: Interventional Cardiology

## 2013-08-12 ENCOUNTER — Ambulatory Visit: Payer: BC Managed Care – PPO | Admitting: Interventional Cardiology

## 2013-09-11 ENCOUNTER — Other Ambulatory Visit: Payer: Self-pay | Admitting: Obstetrics and Gynecology

## 2013-09-24 ENCOUNTER — Ambulatory Visit: Payer: BC Managed Care – PPO | Attending: Gynecologic Oncology | Admitting: Gynecologic Oncology

## 2013-09-24 ENCOUNTER — Encounter: Payer: Self-pay | Admitting: Gynecologic Oncology

## 2013-09-24 VITALS — BP 155/87 | HR 66 | Temp 97.9°F | Resp 20 | Ht 65.0 in | Wt 190.2 lb

## 2013-09-24 DIAGNOSIS — K219 Gastro-esophageal reflux disease without esophagitis: Secondary | ICD-10-CM | POA: Insufficient documentation

## 2013-09-24 DIAGNOSIS — Z79899 Other long term (current) drug therapy: Secondary | ICD-10-CM | POA: Insufficient documentation

## 2013-09-24 DIAGNOSIS — I10 Essential (primary) hypertension: Secondary | ICD-10-CM | POA: Insufficient documentation

## 2013-09-24 DIAGNOSIS — G43909 Migraine, unspecified, not intractable, without status migrainosus: Secondary | ICD-10-CM | POA: Insufficient documentation

## 2013-09-24 DIAGNOSIS — Z7982 Long term (current) use of aspirin: Secondary | ICD-10-CM | POA: Insufficient documentation

## 2013-09-24 DIAGNOSIS — Z96659 Presence of unspecified artificial knee joint: Secondary | ICD-10-CM | POA: Insufficient documentation

## 2013-09-24 DIAGNOSIS — Z8673 Personal history of transient ischemic attack (TIA), and cerebral infarction without residual deficits: Secondary | ICD-10-CM | POA: Insufficient documentation

## 2013-09-24 DIAGNOSIS — G4730 Sleep apnea, unspecified: Secondary | ICD-10-CM | POA: Insufficient documentation

## 2013-09-24 DIAGNOSIS — R1900 Intra-abdominal and pelvic swelling, mass and lump, unspecified site: Secondary | ICD-10-CM

## 2013-09-24 DIAGNOSIS — N898 Other specified noninflammatory disorders of vagina: Secondary | ICD-10-CM | POA: Insufficient documentation

## 2013-09-24 DIAGNOSIS — E7800 Pure hypercholesterolemia, unspecified: Secondary | ICD-10-CM | POA: Insufficient documentation

## 2013-09-24 NOTE — Progress Notes (Signed)
Consult Note: Gyn-Onc  Robyn Aguilar 63 y.o. female  CC:  Chief Complaint  Patient presents with  . Complex cystic mass    New Consult     HPI: Patient is seen today in consultation at the request of Dr. Ross.  [name] is a 63-year-old gravida 3 para 2 who about one to 2 months ago began experiencing some dyspareunia. She went for her anal examination with Dr. Ross. At that time he appreciated a mass on exam. Ultrasound was performed on May 13. A 7.4 x 5.2 cm complex cystic mass at the apex of the vagina. CA 125 was drawn that was normal at 7. The patient comes in for evaluation.  She's otherwise been fairly asymptomatic. She does state that her bowels are slightly different. She states that previously he would be warranted constant stream of stool now she states that it is more disruptive. She describes it as "a smaller volumes". There is no applies change in caliber there is no blood. She denies any dyschezia. She gets tired going up stairs. She denies any current chest pain or shortness of breath. She believes that some of her fatigue is due to not exercising. She also believes that she is retaining fluid. Her weight today in clinic is 190 pounds. She states her usual weight is 178-179 pounds. She denies any nausea or vomiting. She denies any fevers or chills. She denies any early satiety though she states she is intentionally trying to eat less. Her primary care physician is Dr. Cynthia White. Her cardiologist is Dr. Harry Smith.   The patient underwent a right total knee replacement in October of 2014. Immediately after being discharged the next issue with to the emergency room and was diagnosed with heart failure 63 probably due to diastolic dysfunction. Echocardiogram showed an ejection fraction of 70. Of note the patient had a negative coronary angiography 7 years ago for false positive stress test. She's been maintained on Lasix and other medications and recently saw  Dr. Smith about a month ago and was felt to be stable. Her last colonoscopy was 5 years ago. She's due for a repeat. She did have polyps that were benign. She's up-to-date on her mammograms.  Review of Systems:  Constitutional: Feels tired. Denies fever. Skin: No rash Cardiovascular: No chest pain, shortness of breath, or edema  Pulmonary: No cough  Gastro Intestinal: No nausea, vomiting, constipation, or diarrhea reported. No bright red blood per rectum or change in bowel movement except as in HPI. Genitourinary: No frequency, urgency, or dysuria.  Denies vaginal bleeding and discharge. + dyspareunia Musculoskeletal: Bilateral knee surgery Neurologic: No weakness Psychology: No changes   Current Meds:  Outpatient Encounter Prescriptions as of 09/24/2013  Medication Sig  . aspirin 81 MG tablet Take 3 tabs daily  . Cholecalciferol (VITAMIN D3) 3000 UNITS TABS Take by mouth. Take 2 tabs daily equal(2000mg) daily  . furosemide (LASIX) 40 MG tablet Take 1/2 tab  Twice a day  . meperidine (DEMEROL) 50 MG tablet Take 50 mg by mouth every 4 (four) hours as needed for severe pain. Pt takes this for migraines  . potassium chloride SA (K-DUR,KLOR-CON) 20 MEQ tablet Take 0.5 tablets (10 mEq total) by mouth 2 (two) times daily.  . RABEprazole (ACIPHEX) 20 MG tablet Take 20 mg by mouth at bedtime.   . topiramate (TOPAMAX) 25 MG tablet Take two tabs at bedtime  . metoprolol succinate (TOPROL-XL) 100 MG 24 hr tablet Take 100 mg by mouth daily. Take with   or immediately following a meal.    Allergy:  Allergies  Allergen Reactions  . Codeine Other (See Comments)  . Latex Rash    Social Hx:   History   Social History  . Marital Status: Married    Spouse Name: N/A    Number of Children: 2  . Years of Education: college   Occupational History  .     Social History Main Topics  . Smoking status: Never Smoker   . Smokeless tobacco: Never Used  . Alcohol Use: No  . Drug Use: No  . Sexual  Activity: Yes    Birth Control/ Protection: Surgical   Other Topics Concern  . Not on file   Social History Narrative  . No narrative on file    Past Surgical Hx:  Past Surgical History  Procedure Laterality Date  . Carpel tunnel Right 1988  . Knee surgery Left 2011    replacement  . Disc surgery  1996  . Hernia repair  1987  . Abdominal hysterectomy  1991  . Colonoscopy    . Esophagogastroduodenoscopy    . Total knee arthroplasty Right   . Total knee arthroplasty Right 02/04/2013    Procedure: RIGHT TOTAL KNEE ARTHROPLASTY;  Surgeon: Steve Lucey, MD;  Location: [facility] OR;  Service: Orthopedics;  Laterality: Right;    Past Medical Hx:  Past Medical History  Diagnosis Date  . Migraine   . High cholesterol     but doesn't take any meds  . Allergic rhinitis   . TIA (transient ischemic attack)   . Migraines   . Hypertension     takes Metoprolol and maxzide daily  . Sleep apnea     sleep study in epic from 2014-uses CPAP  . Pneumonia     at age 1  . History of migraine     last one 6-8months ago;takes Topamax nightly  . Seizures     hx of;last one 3yrs ago;no meds required;states it was brought on by med;only one ever had  . Dizziness     takes Antivert prn  . Stroke     2 TIA  . Arthritis     knees and neck  . Joint pain   . Joint swelling   . GERD (gastroesophageal reflux disease)     takes Aciphex prn  . History of colon polyps   . Cataracts, bilateral     immature   . Shortness of breath 02/06/2013    Oncology Hx:   No history exists.    Family Hx:  Family History  Problem Relation Age of Onset  . Prostate cancer Other     Vitals:  Blood pressure 155/87, pulse 66, temperature 97.9 F (36.6 C), temperature source Oral, resp. rate 20, height 5' 5" (1.651 m), weight 190 lb 3.2 oz (86.274 kg).  Physical Exam:  Well-nourished well-developed female in no acute distress.  Neck: Well-healed surgical incision on the left side. Neck is supple, there's no  lymphadenopathy, no thyromegaly.  Lungs: Clear to auscultation bilaterally.  Cardiovascular: Regular rate and rhythm.  Abdomen: Well-healed transverse Pfannenstiel skin incision. Bilateral groin incisions are well-healed. Abdomen is soft, nontender, nondistended. There are no palpable masses or hepatosplenomegaly. There is no fluid wave.  Groins: No lymphadenopathy. No recurrent hernias.  Extremities: Well-healed surgical incisions in bilateral knees. No edema.  Pelvic: Normal female genitalia. Bimanual examination reveals a mass measuring 5 cm at the top of the vaginal apex. It is tender to palpation. Bimanual examination mass   is not mobile. There is no nodularity. Rectal confirms. A mass is felt better on vaginal examination than on rectal examination.  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 I explained how this could happen to both her and her husband. I discussed with them the likelihood that this is a malignancy is less than 5% based on the sonographic appearance, the CA 125, and her physical examination. I believe that the mass does need to be surgically removed. The patient wishes to proceed with surgery. 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 I discussed with the tentative surgical date is June 23. I discussed with her and her husband that we could proceed in a minimally invasive fashion..  Risks of surgery including but not limited to bleeding, infection, injury to surrounding organs including blood vessels, nerves, ureters, and bowel were discussed with the patient. We discussed the need for Trendelenburg positioning and abdominal insufflation. We need to speak to both her cardiologist as well as her primary care physician to ensure that there is  no other perioperative risk stratification procedures that need to proceed prior to her surgery date. She understands that she'll need to have a preoperative visit as well as potential perioperative testing based on the recommendations of her other physicians.  Her questions as well as those of her husband were elicited and answered to their satisfaction. She has my card and will contact me should any questions prior to surgery date. I will contact her after speaking to her cardiologist.  Erine Phenix A. Claris Guymon, MD 09/24/2013, 2:51 PM  

## 2013-09-24 NOTE — Patient Instructions (Signed)
You will have surgery on October 22, 2013. Pre-surgical Testing will call you with your appt.                Preparing for your Surgery  Pre-operative Testing -You will receive a phone call from presurgical testing at [facility] to arrange for a pre-operative testing appointment before your surgery.  This appointment normally occurs one to two weeks before your scheduled surgery.   -Bring your insurance card, copy of an advanced directive if applicable, medication list  -At that visit, you will be asked to sign a consent for a possible blood transfusion in case a transfusion becomes necessary during surgery.  The need for a blood transfusion is rare but having consent is a necessary part of your care.     Day Before Surgery at [place] will be asked to take in only clear liquids the day before surgery.  Examples of clear liquids include broths, jello, and clear juices.    You will be advised to have nothing to eat or drink after midnight the evening before.    Your role in recovery Your role is to become active as soon as directed by your doctor, while still giving yourself time to heal.  Rest when you feel tired. You will be asked to do the following in order to speed your recovery:  - Cough and breathe deeply. This helps toclear and expand your lungs and can prevent pneumonia. You may be given a spirometer to practice deep breathing. A staff member will show you how to use the spirometer. - Do mild physical activity. Walking or moving your legs help your circulation and body functions return to normal. A staff member will help you when you try to walk and will provide you with simple exercises. Do not try to get up or walk alone the first time. - Actively manage your pain. Managing your pain lets you move in comfort. We will ask you to rate your pain on a scale of zero to 10. It is your responsibility to tell your doctor or nurse where and how much you hurt so your pain can be  treated.  Special Considerations -If you are diabetic, you may be placed on insulin after surgery to have closer control over your blood sugars to promote healing and recovery.  This does not mean that you will be discharged on insulin.  If applicable, your oral antidiabetics will be resumed when you are tolerating a solid diet.  -Your final pathology results from surgery should be available by the Friday after surgery and the results will be relayed to you when available.

## 2013-10-14 ENCOUNTER — Telehealth: Payer: Self-pay | Admitting: *Deleted

## 2013-10-14 NOTE — Telephone Encounter (Signed)
Pt cleared for surgery on 6/23

## 2013-10-14 NOTE — Telephone Encounter (Signed)
Message copied by Lucile Crater on Mon Oct 14, 2013 11:05 AM ------      Message from: Nancy Marus      Created: Mon Oct 14, 2013  8:03 AM       Thank you so much Dr. [name].      Nancy Marus      ----- Message -----         From: Sinclair Grooms, MD         Sent: 10/04/2013  10:58 AM           To: Lucita Lora. Alycia Rossetti, MD            No pre op studies are needed. She needs tight fluid I/O management, trying to keep near equal. I would also continue diuretic regimen as is and not hold doses.      ----- Message -----         From: Lucita Lora. Alycia Rossetti, MD         Sent: 09/24/2013   3:58 PM           To: Sinclair Grooms, MD, Lucile Crater, RN            Dear Dr. [name]      This patient will need to have a robotic salpingo-oophorectomy and is scheduled for surgery on 6/23. I reviewed her history from the fall when she developed CHF after her knee replacement surgery. Is there anything in particular or additional that you need for [place] to do prior to her surgery.            Thank you in advance. Please call me with any questions. [place]             ------

## 2013-10-15 ENCOUNTER — Encounter (HOSPITAL_COMMUNITY): Payer: Self-pay | Admitting: Pharmacy Technician

## 2013-10-18 ENCOUNTER — Encounter (HOSPITAL_COMMUNITY): Payer: Self-pay

## 2013-10-18 ENCOUNTER — Encounter (HOSPITAL_COMMUNITY)
Admission: RE | Admit: 2013-10-18 | Discharge: 2013-10-18 | Disposition: A | Discharge status: Home / Self Care | Payer: BC Managed Care – PPO | Source: Ambulatory Visit | Attending: Gynecologic Oncology | Admitting: Gynecologic Oncology

## 2013-10-18 DIAGNOSIS — Z01818 Encounter for other preprocedural examination: Secondary | ICD-10-CM | POA: Diagnosis not present

## 2013-10-18 DIAGNOSIS — Z01812 Encounter for preprocedural laboratory examination: Secondary | ICD-10-CM | POA: Diagnosis not present

## 2013-10-18 HISTORY — DX: Other complications of anesthesia, initial encounter: T88.59XA

## 2013-10-18 HISTORY — DX: Adverse effect of unspecified anesthetic, initial encounter: T41.45XA

## 2013-10-18 LAB — URINALYSIS, ROUTINE W REFLEX MICROSCOPIC
Bilirubin Urine: NEGATIVE
Glucose, UA: NEGATIVE mg/dL
Hgb urine dipstick: NEGATIVE
Ketones, ur: NEGATIVE mg/dL
Leukocytes, UA: NEGATIVE
Nitrite: NEGATIVE
Protein, ur: NEGATIVE mg/dL
Specific Gravity, Urine: 1.013 (ref 1.005–1.030)
Urobilinogen, UA: 0.2 mg/dL (ref 0.0–1.0)
pH: 7 (ref 5.0–8.0)

## 2013-10-18 LAB — ABO/RH: ABO/RH(D): B POS

## 2013-10-18 LAB — CBC WITH DIFFERENTIAL/PLATELET
Basophils Absolute: 0 10*3/uL (ref 0.0–0.1)
Basophils Relative: 1 % (ref 0–1)
Eosinophils Absolute: 0.1 10*3/uL (ref 0.0–0.7)
Eosinophils Relative: 2 % (ref 0–5)
HCT: 41.9 % (ref 36.0–46.0)
Hemoglobin: 13.7 g/dL (ref 12.0–15.0)
Lymphocytes Relative: 36 % (ref 12–46)
Lymphs Abs: 1.4 10*3/uL (ref 0.7–4.0)
MCH: 27.7 pg (ref 26.0–34.0)
MCHC: 32.7 g/dL (ref 30.0–36.0)
MCV: 84.8 fL (ref 78.0–100.0)
Monocytes Absolute: 0.4 10*3/uL (ref 0.1–1.0)
Monocytes Relative: 9 % (ref 3–12)
Neutro Abs: 2 10*3/uL (ref 1.7–7.7)
Neutrophils Relative %: 52 % (ref 43–77)
Platelets: 316 10*3/uL (ref 150–400)
RBC: 4.94 MIL/uL (ref 3.87–5.11)
RDW: 15.3 % (ref 11.5–15.5)
WBC: 3.8 10*3/uL — ABNORMAL LOW (ref 4.0–10.5)

## 2013-10-18 LAB — COMPREHENSIVE METABOLIC PANEL
ALT: 25 U/L (ref 0–35)
AST: 27 U/L (ref 0–37)
Albumin: 4.2 g/dL (ref 3.5–5.2)
Alkaline Phosphatase: 122 U/L — ABNORMAL HIGH (ref 39–117)
BUN: 14 mg/dL (ref 6–23)
CO2: 30 mEq/L (ref 19–32)
Calcium: 10.3 mg/dL (ref 8.4–10.5)
Chloride: 101 mEq/L (ref 96–112)
Creatinine, Ser: 0.94 mg/dL (ref 0.50–1.10)
GFR calc Af Amer: 73 mL/min — ABNORMAL LOW (ref >90)
GFR calc non Af Amer: 63 mL/min — ABNORMAL LOW (ref >90)
Glucose, Bld: 149 mg/dL — ABNORMAL HIGH (ref 70–99)
Potassium: 4.4 mEq/L (ref 3.7–5.3)
Sodium: 144 mEq/L (ref 137–147)
Total Bilirubin: 0.5 mg/dL (ref 0.3–1.2)
Total Protein: 8 g/dL (ref 6.0–8.3)

## 2013-10-18 NOTE — Patient Instructions (Addendum)
20     Your procedure is scheduled on:  Tuesday 10/22/2013  Report to [facility] Main Entrance and follow signs to Short Stay  at  0930 AM.  Call this number if you have problems the night before or morning of surgery:   [phone]   Remember: FOLLOW CLEAR LIQUID DIET ALL DAY Monday 10/21/2013 AS DIRECTED BY DR.GEHRIG'S OFFICE!              IF YOU USE CPAP,BRING MASK AND TUBING AM OF SURGERY!             IF YOU DO NOT HAVE YOUR TYPE AND SCREEN DRAWN AT PRE-ADMIT APPOINTMENT, YOU WILL HAVE IT DRAWN AM OF SURGERY!   Do not eat food  AFTER MIDNIGHT!  Take these medicines the morning of surgery with A SIP OF WATER: METOPROLOL, ZYRTEC, FLONASE NASAL SPRAY    [facility] IS NOT RESPONSIBLE FOR ANY BELONGINGS OR VALUABLES BROUGHT TO HOSPITAL.  [name]  Leave suitcase in the car. After surgery it may be brought to your room.  For patients admitted to the hospital, checkout time is 11:00 AM the day of              Discharge.    DO NOT WEAR JEWELRY,MAKE-UP,LOTIONS,POWDERS,PERFUMES,CONTACTS , DENTURES OR BRIDGEWORK ,AND DO NOT WEAR FALSE EYELASHES                                    Patients discharged the day of surgery will not be allowed to drive home.  If going home the same day of surgery, must have someone stay with you  first 24 hrs.at home and arrange for someone to drive you home from the [place]: N/A   Special Instructions:              Please read over the following fact sheets that you were given:             [address]Tobin Chad     [phone]                              [facility] - Preparing for Surgery Before surgery, you can play an important role.  Because skin is not sterile, your skin needs to be as free of germs as possible.  You can reduce the number of germs on your skin by washing with CHG  (chlorahexidine gluconate) soap before surgery.  CHG is an antiseptic cleaner which kills germs and bonds with the skin to continue killing germs even after washing. Please DO NOT use if you have an allergy to CHG or antibacterial soaps.  If your skin becomes reddened/irritated stop using the CHG and inform your nurse when you arrive at Short Stay. Do not shave (including legs and underarms) for at least 48 hours prior to the first CHG shower.  You  may shave your face/neck. Please follow these instructions carefully:  1.  Shower with CHG Soap the night before surgery and the  morning of Surgery.  2.  If you choose to wash your hair, wash your hair first as usual with your  normal  shampoo.  3.  After you shampoo, rinse your hair and body thoroughly to remove the  shampoo.                           4.  Use CHG as you would any other liquid soap.  You can apply chg directly  to the skin and wash                       Gently with a scrungie or clean washcloth.  5.  Apply the CHG Soap to your body ONLY FROM THE NECK DOWN.   Do not use on face/ open                           Wound or open sores. Avoid contact with eyes, ears mouth and genitals (private parts).                       Wash face,  Genitals (private parts) with your normal soap.             6.  Wash thoroughly, paying special attention to the area where your surgery  will be performed.  7.  Thoroughly rinse your body with warm water from the neck down.  8.  DO NOT shower/wash with your normal soap after using and rinsing off  the CHG Soap.                9.  Pat yourself dry with a clean towel.            10.  Wear clean pajamas.            11.  Place clean sheets on your bed the night of your first shower and do not  sleep with pets. Day of Surgery : Do not apply any lotions/deodorants the morning of surgery.  Please wear clean clothes to the hospital/surgery center.  FAILURE TO FOLLOW THESE INSTRUCTIONS MAY RESULT IN THE CANCELLATION OF  YOUR SURGERY PATIENT SIGNATURE_________________________________  NURSE SIGNATURE__________________________________  ________________________________________________________________________    CLEAR LIQUID DIET   Foods Allowed                                                                     Foods Excluded  Coffee and tea, regular and decaf                             liquids that you cannot  Plain Jell-O in any flavor                                             see through such as: Fruit ices (not with fruit pulp)  milk, soups, orange juice  Iced Popsicles                                    All solid food Carbonated beverages, regular and diet                                    Cranberry, grape and apple juices Sports drinks like Gatorade Lightly seasoned clear broth or consume(fat free) Sugar, honey syrup  Sample Menu Breakfast                                Lunch                                     Supper Cranberry juice                    Beef broth                            Chicken broth Jell-O                                     Grape juice                           Apple juice Coffee or tea                        Jell-O                                      Popsicle                                                Coffee or tea                        Coffee or tea  _____________________________________________________________________    Incentive Spirometer  An incentive spirometer is a tool that can help keep your lungs clear and active. This tool measures how well you are filling your lungs with each breath. Taking long deep breaths may help reverse or decrease the chance of developing breathing (pulmonary) problems (especially infection) following:  A long period of time when you are unable to move or be active. BEFORE THE PROCEDURE   If the spirometer includes an indicator to show your best effort, your nurse or respiratory  therapist will set it to a desired goal.  If possible, sit up straight or lean slightly forward. Try not to slouch.  Hold the incentive spirometer in an upright position. INSTRUCTIONS FOR USE  1. Sit on the edge of your bed if possible, or sit up as far as you can in bed or on a chair. 2. Hold the incentive spirometer in an upright position. 3. Breathe out normally.  4. Place the mouthpiece in your mouth and seal your lips tightly around it. 5. Breathe in slowly and as deeply as possible, raising the piston or the ball toward the top of the column. 6. Hold your breath for 3-5 seconds or for as long as possible. Allow the piston or ball to fall to the bottom of the column. 7. Remove the mouthpiece from your mouth and breathe out normally. 8. Rest for a few seconds and repeat Steps 1 through 7 at least 10 times every 1-2 hours when you are awake. Take your time and take a few normal breaths between deep breaths. 9. The spirometer may include an indicator to show your best effort. Use the indicator as a goal to work toward during each repetition. 10. After each set of 10 deep breaths, practice coughing to be sure your lungs are clear. If you have an incision (the cut made at the time of surgery), support your incision when coughing by placing a pillow or rolled up towels firmly against it. Once you are able to get out of bed, walk around indoors and cough well. You may stop using the incentive spirometer when instructed by your caregiver.  RISKS AND COMPLICATIONS  Take your time so you do not get dizzy or light-headed.  If you are in pain, you may need to take or ask for pain medication before doing incentive spirometry. It is harder to take a deep breath if you are having pain. AFTER USE  Rest and breathe slowly and easily.  It can be helpful to keep track of a log of your progress. Your caregiver can provide you with a simple table to help with this. If you are using the spirometer at home,  follow these instructions: [place] IF:   You are having difficultly using the spirometer.  You have trouble using the spirometer as often as instructed.  Your pain medication is not giving enough relief while using the spirometer.  You develop fever of 100.5 F (38.1 C) or higher. SEEK IMMEDIATE MEDICAL CARE IF:   You cough up bloody sputum that had not been present before.  You develop fever of 102 F (38.9 C) or greater.  You develop worsening pain at or near the incision site. MAKE SURE YOU:   Understand these instructions.  Will watch your condition.  Will get help right away if you are not doing well or get worse. Document Released: 08/29/2006 Document Revised: 07/11/2011 Document Reviewed: 10/30/2006 [employer] Patient Information 2014 [employer], [place].   ________________________________________________________________________  WHAT IS A BLOOD TRANSFUSION? Blood Transfusion Information  A transfusion is the replacement of blood or some of its parts. Blood is made up of multiple cells which provide different functions.  Red blood cells carry oxygen and are used for blood loss replacement.  White blood cells fight against infection.  Platelets control bleeding.  Plasma helps clot blood.  Other blood products are available for specialized needs, such as hemophilia or other clotting disorders. BEFORE THE TRANSFUSION  Who gives blood for transfusions?   Healthy volunteers who are fully evaluated to make sure their blood is safe. This is blood bank blood. Transfusion therapy is the safest it has ever been in the practice of medicine. Before blood is taken from a donor, a complete history is taken to make sure that person has no history of diseases nor engages in risky social behavior (examples are intravenous drug use or sexual activity with multiple partners). The donor's travel history is  screened to minimize risk of transmitting infections, such as malaria.  The donated blood is tested for signs of infectious diseases, such as HIV and hepatitis. The blood is then tested to be sure it is compatible with you in order to minimize the chance of a transfusion reaction. If you or a relative donates blood, this is often done in anticipation of surgery and is not appropriate for emergency situations. It takes many days to process the donated blood. RISKS AND COMPLICATIONS Although transfusion therapy is very safe and saves many lives, the main dangers of transfusion include:   Getting an infectious disease.  Developing a transfusion reaction. This is an allergic reaction to something in the blood you were given. Every precaution is taken to prevent this. The decision to have a blood transfusion has been considered carefully by your caregiver before blood is given. Blood is not given unless the benefits outweigh the risks. AFTER THE TRANSFUSION  Right after receiving a blood transfusion, you will usually feel much better and more energetic. This is especially true if your red blood cells have gotten low (anemic). The transfusion raises the level of the red blood cells which carry oxygen, and this usually causes an energy increase.  The nurse administering the transfusion will monitor you carefully for complications. HOME CARE INSTRUCTIONS  No special instructions are needed after a transfusion. You may find your energy is better. Speak with your caregiver about any limitations on activity for underlying diseases you may have. SEEK MEDICAL CARE IF:   Your condition is not improving after your transfusion.  You develop redness or irritation at the intravenous (IV) site. SEEK IMMEDIATE MEDICAL CARE IF:  Any of the following symptoms occur over the next 12 hours:  Shaking chills.  You have a temperature by mouth above 102 F (38.9 C), not controlled by medicine.  Chest, back, or muscle pain.  People around you feel you are not acting correctly or are  confused.  Shortness of breath or difficulty breathing.  Dizziness and fainting.  You get a rash or develop hives.  You have a decrease in urine output.  Your urine turns a dark color or changes to pink, red, or brown. Any of the following symptoms occur over the next 10 days:  You have a temperature by mouth above 102 F (38.9 C), not controlled by medicine.  Shortness of breath.  Weakness after normal activity.  The white part of the eye turns yellow (jaundice).  You have a decrease in the amount of urine or are urinating less often.  Your urine turns a dark color or changes to pink, red, or brown. Document Released: 04/15/2000 Document Revised: 07/11/2011 Document Reviewed: 12/03/2007 [facility] Patient Information 2014 [place], [place].  _______________________________________________________________________

## 2013-10-22 ENCOUNTER — Encounter (HOSPITAL_COMMUNITY): Payer: BC Managed Care – PPO | Admitting: Certified Registered"

## 2013-10-22 ENCOUNTER — Encounter (HOSPITAL_COMMUNITY)
Admission: RE | Disposition: A | Discharge status: Home / Self Care | Payer: Self-pay | Source: Ambulatory Visit | Attending: Gynecologic Oncology

## 2013-10-22 ENCOUNTER — Inpatient Hospital Stay (HOSPITAL_COMMUNITY): Payer: BC Managed Care – PPO | Admitting: Certified Registered"

## 2013-10-22 ENCOUNTER — Encounter (HOSPITAL_COMMUNITY): Payer: Self-pay | Admitting: Certified Registered"

## 2013-10-22 ENCOUNTER — Ambulatory Visit (HOSPITAL_COMMUNITY)
Admission: RE | Admit: 2013-10-22 | Discharge: 2013-10-23 | Disposition: A | Discharge status: Home / Self Care | Payer: BC Managed Care – PPO | Source: Ambulatory Visit | Attending: Gynecologic Oncology | Admitting: Gynecologic Oncology

## 2013-10-22 DIAGNOSIS — J449 Chronic obstructive pulmonary disease, unspecified: Secondary | ICD-10-CM | POA: Insufficient documentation

## 2013-10-22 DIAGNOSIS — Z96659 Presence of unspecified artificial knee joint: Secondary | ICD-10-CM | POA: Insufficient documentation

## 2013-10-22 DIAGNOSIS — Z7982 Long term (current) use of aspirin: Secondary | ICD-10-CM | POA: Insufficient documentation

## 2013-10-22 DIAGNOSIS — D279 Benign neoplasm of unspecified ovary: Secondary | ICD-10-CM

## 2013-10-22 DIAGNOSIS — J4489 Other specified chronic obstructive pulmonary disease: Secondary | ICD-10-CM | POA: Insufficient documentation

## 2013-10-22 DIAGNOSIS — N9489 Other specified conditions associated with female genital organs and menstrual cycle: Secondary | ICD-10-CM

## 2013-10-22 DIAGNOSIS — I10 Essential (primary) hypertension: Secondary | ICD-10-CM | POA: Insufficient documentation

## 2013-10-22 DIAGNOSIS — K219 Gastro-esophageal reflux disease without esophagitis: Secondary | ICD-10-CM | POA: Insufficient documentation

## 2013-10-22 DIAGNOSIS — R87611 Atypical squamous cells cannot exclude high grade squamous intraepithelial lesion on cytologic smear of cervix (ASC-H): Secondary | ICD-10-CM

## 2013-10-22 DIAGNOSIS — G4730 Sleep apnea, unspecified: Secondary | ICD-10-CM | POA: Insufficient documentation

## 2013-10-22 DIAGNOSIS — Z79899 Other long term (current) drug therapy: Secondary | ICD-10-CM | POA: Insufficient documentation

## 2013-10-22 DIAGNOSIS — R1900 Intra-abdominal and pelvic swelling, mass and lump, unspecified site: Secondary | ICD-10-CM | POA: Diagnosis present

## 2013-10-22 DIAGNOSIS — Z8673 Personal history of transient ischemic attack (TIA), and cerebral infarction without residual deficits: Secondary | ICD-10-CM | POA: Insufficient documentation

## 2013-10-22 HISTORY — PX: ROBOTIC ASSISTED BILATERAL SALPINGO OOPHERECTOMY: SHX6078

## 2013-10-22 LAB — TYPE AND SCREEN
ABO/RH(D): B POS
Antibody Screen: NEGATIVE

## 2013-10-22 SURGERY — ROBOTIC ASSISTED BILATERAL SALPINGO OOPHORECTOMY
Anesthesia: General | Laterality: Bilateral

## 2013-10-22 MED ORDER — SUCCINYLCHOLINE CHLORIDE 20 MG/ML IJ SOLN
INTRAMUSCULAR | Status: DC | PRN
  Administered 2013-10-22: 100 mg via INTRAVENOUS

## 2013-10-22 MED ORDER — LACTATED RINGERS IV SOLN
INTRAVENOUS | Status: DC | PRN
  Administered 2013-10-22: 11:00:00 via INTRAVENOUS

## 2013-10-22 MED ORDER — FUROSEMIDE 40 MG PO TABS
40.0000 mg | ORAL_TABLET | Freq: Two times a day (BID) | ORAL | Status: DC
  Administered 2013-10-22 – 2013-10-23 (×2): 40 mg via ORAL
  Filled 2013-10-22 (×5): qty 1

## 2013-10-22 MED ORDER — TOPIRAMATE 25 MG PO TABS
50.0000 mg | ORAL_TABLET | Freq: Every day | ORAL | Status: DC
  Filled 2013-10-22 (×2): qty 2

## 2013-10-22 MED ORDER — LIDOCAINE HCL (PF) 2 % IJ SOLN
INTRAMUSCULAR | Status: DC | PRN
  Administered 2013-10-22: 40 mg via INTRADERMAL

## 2013-10-22 MED ORDER — ONDANSETRON HCL 4 MG/2ML IJ SOLN: 4.0000 mg | Freq: Four times a day (QID) | INTRAMUSCULAR | Status: DC | PRN

## 2013-10-22 MED ORDER — CEFAZOLIN SODIUM-DEXTROSE 2-3 GM-% IV SOLR
INTRAVENOUS | Status: AC
  Filled 2013-10-22: qty 50

## 2013-10-22 MED ORDER — DEXAMETHASONE SODIUM PHOSPHATE 10 MG/ML IJ SOLN
INTRAMUSCULAR | Status: DC | PRN
  Administered 2013-10-22: 10 mg via INTRAVENOUS

## 2013-10-22 MED ORDER — CEFAZOLIN SODIUM-DEXTROSE 2-3 GM-% IV SOLR
2.0000 g | INTRAVENOUS | Status: AC
  Administered 2013-10-22: 2 g via INTRAVENOUS

## 2013-10-22 MED ORDER — PANTOPRAZOLE SODIUM 40 MG PO TBEC
40.0000 mg | DELAYED_RELEASE_TABLET | Freq: Every day | ORAL | Status: DC
  Administered 2013-10-22: 40 mg via ORAL
  Filled 2013-10-22 (×2): qty 1

## 2013-10-22 MED ORDER — SODIUM CHLORIDE 0.9 % IJ SOLN: 3.0000 mL | Freq: Two times a day (BID) | INTRAMUSCULAR | Status: DC

## 2013-10-22 MED ORDER — FENTANYL CITRATE 0.05 MG/ML IJ SOLN
INTRAMUSCULAR | Status: AC
  Filled 2013-10-22: qty 5

## 2013-10-22 MED ORDER — OXYCODONE-ACETAMINOPHEN 5-325 MG PO TABS: 1.0000 | ORAL_TABLET | Freq: Four times a day (QID) | ORAL | Status: DC | PRN

## 2013-10-22 MED ORDER — OXYCODONE HCL 5 MG PO TABS: 5.0000 mg | ORAL_TABLET | ORAL | Status: DC | PRN

## 2013-10-22 MED ORDER — ONDANSETRON HCL 4 MG PO TABS: 4.0000 mg | ORAL_TABLET | Freq: Four times a day (QID) | ORAL | Status: DC | PRN

## 2013-10-22 MED ORDER — SODIUM CHLORIDE 0.9 % IJ SOLN: 3.0000 mL | INTRAMUSCULAR | Status: DC | PRN

## 2013-10-22 MED ORDER — IBUPROFEN 600 MG PO TABS
600.0000 mg | ORAL_TABLET | Freq: Four times a day (QID) | ORAL | Status: DC | PRN
  Filled 2013-10-22: qty 1

## 2013-10-22 MED ORDER — MIDAZOLAM HCL 2 MG/2ML IJ SOLN
INTRAMUSCULAR | Status: AC
  Filled 2013-10-22: qty 2

## 2013-10-22 MED ORDER — GLYCOPYRROLATE 0.2 MG/ML IJ SOLN
INTRAMUSCULAR | Status: AC
  Filled 2013-10-22: qty 2

## 2013-10-22 MED ORDER — IBUPROFEN 600 MG PO TABS: 600.0000 mg | ORAL_TABLET | Freq: Four times a day (QID) | ORAL | Status: DC | PRN

## 2013-10-22 MED ORDER — SODIUM CHLORIDE 0.9 % IJ SOLN
3.0000 mL | Freq: Two times a day (BID) | INTRAMUSCULAR | Status: DC
  Administered 2013-10-22: 3 mL via INTRAVENOUS

## 2013-10-22 MED ORDER — FLUTICASONE PROPIONATE 50 MCG/ACT NA SUSP
1.0000 | Freq: Every morning | NASAL | Status: DC
  Administered 2013-10-23: 1 via NASAL
  Filled 2013-10-22: qty 16

## 2013-10-22 MED ORDER — SODIUM CHLORIDE 0.9 % IV SOLN: 250.0000 mL | INTRAVENOUS | Status: DC | PRN

## 2013-10-22 MED ORDER — PROPOFOL 10 MG/ML IV BOLUS
INTRAVENOUS | Status: DC | PRN
  Administered 2013-10-22: 150 mg via INTRAVENOUS

## 2013-10-22 MED ORDER — GLYCOPYRROLATE 0.2 MG/ML IJ SOLN
INTRAMUSCULAR | Status: DC | PRN
  Administered 2013-10-22: 0.4 mg via INTRAVENOUS

## 2013-10-22 MED ORDER — HEPARIN SODIUM (PORCINE) 1000 UNIT/ML IJ SOLN
INTRAMUSCULAR | Status: AC
  Filled 2013-10-22: qty 1

## 2013-10-22 MED ORDER — STERILE WATER FOR IRRIGATION IR SOLN
Status: DC | PRN
  Administered 2013-10-22: 3000 mL

## 2013-10-22 MED ORDER — PROPOFOL 10 MG/ML IV BOLUS
INTRAVENOUS | Status: AC
  Filled 2013-10-22: qty 20

## 2013-10-22 MED ORDER — FENTANYL CITRATE 0.05 MG/ML IJ SOLN
INTRAMUSCULAR | Status: DC | PRN
  Administered 2013-10-22 (×3): 50 ug via INTRAVENOUS
  Administered 2013-10-22: 100 ug via INTRAVENOUS

## 2013-10-22 MED ORDER — OXYCODONE-ACETAMINOPHEN 5-325 MG PO TABS: 1.0000 | ORAL_TABLET | ORAL | Status: DC | PRN

## 2013-10-22 MED ORDER — LACTATED RINGERS IV SOLN: INTRAVENOUS | Status: DC

## 2013-10-22 MED ORDER — SIMETHICONE 80 MG PO CHEW
80.0000 mg | CHEWABLE_TABLET | Freq: Four times a day (QID) | ORAL | Status: DC | PRN
  Filled 2013-10-22: qty 1

## 2013-10-22 MED ORDER — ROCURONIUM BROMIDE 100 MG/10ML IV SOLN
INTRAVENOUS | Status: AC
  Filled 2013-10-22: qty 1

## 2013-10-22 MED ORDER — PROMETHAZINE HCL 25 MG/ML IJ SOLN: 6.2500 mg | INTRAMUSCULAR | Status: DC | PRN

## 2013-10-22 MED ORDER — POTASSIUM CHLORIDE CRYS ER 20 MEQ PO TBCR
20.0000 meq | EXTENDED_RELEASE_TABLET | Freq: Every day | ORAL | Status: DC
  Administered 2013-10-22 – 2013-10-23 (×2): 20 meq via ORAL
  Filled 2013-10-22 (×2): qty 1

## 2013-10-22 MED ORDER — HYDROMORPHONE HCL PF 1 MG/ML IJ SOLN
0.2500 mg | INTRAMUSCULAR | Status: DC | PRN
Start: 2013-10-22 — End: 2013-10-22
  Administered 2013-10-22: 0.5 mg via INTRAVENOUS

## 2013-10-22 MED ORDER — MIDAZOLAM HCL 5 MG/5ML IJ SOLN
INTRAMUSCULAR | Status: DC | PRN
  Administered 2013-10-22: 2 mg via INTRAVENOUS

## 2013-10-22 MED ORDER — ACETAMINOPHEN 650 MG RE SUPP
650.0000 mg | RECTAL | Status: DC | PRN
  Filled 2013-10-22: qty 1

## 2013-10-22 MED ORDER — ONDANSETRON HCL 4 MG/2ML IJ SOLN
INTRAMUSCULAR | Status: AC
  Filled 2013-10-22: qty 2

## 2013-10-22 MED ORDER — LACTATED RINGERS IR SOLN
Status: DC | PRN
  Administered 2013-10-22: 1000 mL

## 2013-10-22 MED ORDER — MEPERIDINE HCL 50 MG PO TABS
50.0000 mg | ORAL_TABLET | ORAL | Status: DC | PRN
  Administered 2013-10-22: 50 mg via ORAL

## 2013-10-22 MED ORDER — HYDROMORPHONE HCL PF 1 MG/ML IJ SOLN
INTRAMUSCULAR | Status: AC
  Filled 2013-10-22: qty 1

## 2013-10-22 MED ORDER — HYDRALAZINE HCL 20 MG/ML IJ SOLN
INTRAMUSCULAR | Status: DC | PRN
  Administered 2013-10-22: 5 mg via INTRAVENOUS

## 2013-10-22 MED ORDER — LORATADINE 10 MG PO TABS
10.0000 mg | ORAL_TABLET | Freq: Every day | ORAL | Status: DC
  Filled 2013-10-22 (×2): qty 1

## 2013-10-22 MED ORDER — DEXAMETHASONE SODIUM PHOSPHATE 10 MG/ML IJ SOLN
INTRAMUSCULAR | Status: AC
  Filled 2013-10-22: qty 1

## 2013-10-22 MED ORDER — ONDANSETRON HCL 4 MG/2ML IJ SOLN
INTRAMUSCULAR | Status: DC | PRN
  Administered 2013-10-22: 4 mg via INTRAVENOUS

## 2013-10-22 MED ORDER — HYDROMORPHONE HCL PF 2 MG/ML IJ SOLN
INTRAMUSCULAR | Status: AC
  Filled 2013-10-22: qty 1

## 2013-10-22 MED ORDER — ROCURONIUM BROMIDE 100 MG/10ML IV SOLN
INTRAVENOUS | Status: DC | PRN
  Administered 2013-10-22: 40 mg via INTRAVENOUS

## 2013-10-22 MED ORDER — METOPROLOL SUCCINATE ER 100 MG PO TB24
100.0000 mg | ORAL_TABLET | Freq: Every morning | ORAL | Status: DC
  Administered 2013-10-23: 100 mg via ORAL
  Filled 2013-10-22: qty 1

## 2013-10-22 MED ORDER — NEOSTIGMINE METHYLSULFATE 10 MG/10ML IV SOLN
INTRAVENOUS | Status: DC | PRN
  Administered 2013-10-22: 3 mg via INTRAVENOUS

## 2013-10-22 MED ORDER — ACETAMINOPHEN 325 MG PO TABS: 650.0000 mg | ORAL_TABLET | ORAL | Status: DC | PRN

## 2013-10-22 SURGICAL SUPPLY — 56 items
BENZOIN TINCTURE PRP APPL 2/3 (GAUZE/BANDAGES/DRESSINGS) IMPLANT
CABLE HIGH FREQUENCY MONO STRZ (ELECTRODE) ×2 IMPLANT
CHLORAPREP W/TINT 26ML (MISCELLANEOUS) ×2 IMPLANT
CORDS BIPOLAR (ELECTRODE) ×2 IMPLANT
COVER MAYO STAND STRL (DRAPES) ×2 IMPLANT
COVER SURGICAL LIGHT HANDLE (MISCELLANEOUS) ×2 IMPLANT
COVER TIP SHEARS 8 DVNC (MISCELLANEOUS) ×1 IMPLANT
COVER TIP SHEARS 8MM DA VINCI (MISCELLANEOUS) ×1
DRAPE LG THREE QUARTER DISP (DRAPES) ×4 IMPLANT
DRAPE SURG IRRIG POUCH 19X23 (DRAPES) ×2 IMPLANT
DRAPE TABLE BACK 44X90 PK DISP (DRAPES) ×4 IMPLANT
DRAPE UTILITY XL STRL (DRAPES) ×2 IMPLANT
DRAPE WARM FLUID 44X44 (DRAPE) ×2 IMPLANT
DRSG TEGADERM 2-3/8X2-3/4 SM (GAUZE/BANDAGES/DRESSINGS) ×6 IMPLANT
DRSG TEGADERM 4X4.75 (GAUZE/BANDAGES/DRESSINGS) ×2 IMPLANT
DRSG TEGADERM 6X8 (GAUZE/BANDAGES/DRESSINGS) IMPLANT
ELECT REM PT RETURN 9FT ADLT (ELECTROSURGICAL) ×2
ELECTRODE REM PT RTRN 9FT ADLT (ELECTROSURGICAL) ×1 IMPLANT
GAUZE SPONGE 2X2 8PLY STRL LF (GAUZE/BANDAGES/DRESSINGS) ×1 IMPLANT
GLOVE BIO SURGEON STRL SZ 6.5 (GLOVE) ×12 IMPLANT
GLOVE BIO SURGEON STRL SZ7.5 (GLOVE) IMPLANT
GLOVE BIOGEL PI IND STRL 7.0 (GLOVE) ×2 IMPLANT
GLOVE BIOGEL PI INDICATOR 7.0 (GLOVE) ×2
GOWN STRL REUS W/ TWL XL LVL3 (GOWN DISPOSABLE) ×2 IMPLANT
GOWN STRL REUS W/TWL XL LVL3 (GOWN DISPOSABLE) ×2
HOLDER FOLEY CATH W/STRAP (MISCELLANEOUS) IMPLANT
KIT ACCESSORY DA VINCI DISP (KITS) ×1
KIT ACCESSORY DVNC DISP (KITS) ×1 IMPLANT
KIT BASIN OR (CUSTOM PROCEDURE TRAY) ×2 IMPLANT
MANIPULATOR UTERINE 4.5 ZUMI (MISCELLANEOUS) ×2 IMPLANT
MARKER SKIN DUAL TIP RULER LAB (MISCELLANEOUS) IMPLANT
OCCLUDER COLPOPNEUMO (BALLOONS) ×2 IMPLANT
POUCH SPECIMEN RETRIEVAL 10MM (ENDOMECHANICALS) ×4 IMPLANT
SET TUBE IRRIG SUCTION NO TIP (IRRIGATION / IRRIGATOR) ×2 IMPLANT
SHEET LAVH (DRAPES) ×2 IMPLANT
SOLUTION ANTI FOG 6CC (MISCELLANEOUS) ×2 IMPLANT
SOLUTION ELECTROLUBE (MISCELLANEOUS) ×2 IMPLANT
SPONGE GAUZE 2X2 STER 10/PKG (GAUZE/BANDAGES/DRESSINGS) ×1
SPONGE LAP 18X18 X RAY DECT (DISPOSABLE) IMPLANT
STRIP CLOSURE SKIN 1/2X4 (GAUZE/BANDAGES/DRESSINGS) IMPLANT
SUT VIC AB 0 CT1 27 (SUTURE) ×1
SUT VIC AB 0 CT1 27XBRD ANTBC (SUTURE) ×1 IMPLANT
SUT VIC AB 4-0 PS2 27 (SUTURE) ×4 IMPLANT
SUT VICRYL 0 UR6 27IN ABS (SUTURE) IMPLANT
SYR BULB IRRIGATION 50ML (SYRINGE) IMPLANT
SYRINGE 60CC LL (MISCELLANEOUS) ×2 IMPLANT
TOWEL OR 17X26 10 PK STRL BLUE (TOWEL DISPOSABLE) ×4 IMPLANT
TRAP SPECIMEN MUCOUS 40CC (MISCELLANEOUS) ×2 IMPLANT
TRAY FOLEY CATH 14FRSI W/METER (CATHETERS) IMPLANT
TRAY FOLEY MTR SLVR 18FR LF (CATHETERS) ×2 IMPLANT
TRAY LAP CHOLE (CUSTOM PROCEDURE TRAY) ×2 IMPLANT
TROCAR 12M 150ML BLUNT (TROCAR) ×2 IMPLANT
TROCAR BLADELESS OPT 5 100 (ENDOMECHANICALS) ×2 IMPLANT
TROCAR XCEL 12X100 BLDLESS (ENDOMECHANICALS) ×2 IMPLANT
TUBING INSUFFLATION 10FT LAP (TUBING) ×2 IMPLANT
WATER STERILE IRR 1500ML POUR (IV SOLUTION) ×2 IMPLANT

## 2013-10-22 NOTE — Progress Notes (Signed)
Dr. Winfred Leeds in to talk with patient.

## 2013-10-22 NOTE — Anesthesia Preprocedure Evaluation (Addendum)
Anesthesia Evaluation  Patient identified by MRN, date of birth, ID band Patient awake    Reviewed: Allergy & Precautions, H&P , NPO status , Patient's Chart, lab work & pertinent test results  Airway Mallampati: I TM Distance: >3 FB Neck ROM: full    Dental  (+) Teeth Intact, Dental Advisory Given   Pulmonary sleep apnea and Continuous Positive Airway Pressure Ventilation , COPD breath sounds clear to auscultation  Pulmonary exam normal       Cardiovascular hypertension, Pt. on home beta blockers Rhythm:regular Rate:Normal     Neuro/Psych  Headaches, Seizures -, Well Controlled,  TIACVA negative psych ROS   GI/Hepatic Neg liver ROS, GERD-  ,  Endo/Other  negative endocrine ROS  Renal/GU negative Renal ROS  negative genitourinary   Musculoskeletal negative musculoskeletal ROS (+)   Abdominal   Peds  Hematology negative hematology ROS (+)   Anesthesia Other Findings   Reproductive/Obstetrics                       Anesthesia Physical Anesthesia Plan  ASA: III  Anesthesia Plan: General   Post-op Pain Management:    Induction: Intravenous  Airway Management Planned: Oral ETT  Additional Equipment:   Intra-op Plan:   Post-operative Plan: Extubation in OR  Informed Consent: I have reviewed the patients History and Physical, chart, labs and discussed the procedure including the risks, benefits and alternatives for the proposed anesthesia with the patient or authorized representative who has indicated his/her understanding and acceptance.   Dental advisory given  Plan Discussed with: CRNA  Anesthesia Plan Comments:         Anesthesia Quick Evaluation

## 2013-10-22 NOTE — Progress Notes (Signed)
Report called to [place], pt. Taken to [employer] via W/C.

## 2013-10-22 NOTE — Progress Notes (Signed)
Dr. Delsa Sale made aware of patient's concerns about going home today- instructed to take patient patient to Short Stay and see how she does.

## 2013-10-22 NOTE — Discharge Instructions (Signed)
10/22/2013  Return to work: 1 - 2 weeks  Activity: 1. Be up and out of the bed during the day.  Take a nap if needed.  You may walk up steps but be careful and use the hand rail.  Stair climbing will tire you more than you think, you may need to stop part way and rest.   2. No lifting or straining for 6 weeks.  3. Do Not drive if you are taking narcotic pain medicine.  4. Shower daily.  Use soap and water on your incision and pat dry; don't rub.   5. No sexual activity and nothing in the vagina for 6 weeks.  Diet: 1. Low sodium Heart Healthy Diet is recommended.  2. It is safe to use a laxative if you have difficulty moving your bowels.   Wound Care: 1. Keep clean and dry.  Shower daily.  Reasons to call the Doctor:   Fever - Oral temperature greater than 100.4 degrees Fahrenheit  Foul-smelling vaginal discharge  Difficulty urinating  Nausea and vomiting  Increased pain at the site of the incision that is unrelieved with pain medicine.  Difficulty breathing with or without chest pain  New calf pain especially if only on one side  Sudden, continuing increased vaginal bleeding with or without clots.    Contacts: For questions or concerns you should contact:  Dr. Lahoma Crocker at [phone]  Dr. Nancy Marus at [facility] [phone]  Bilateral Salpingo-Oophorectomy, Care After Refer to this sheet in the next few weeks. These instructions provide you with information on caring for yourself after your procedure. Your health care provider may also give you more specific instructions. Your treatment has been planned according to current medical practices, but problems sometimes occur. Call your health care provider if you have any problems or questions after your procedure. WHAT TO EXPECT AFTER THE PROCEDURE After your procedure, it is typical to have the following:   Abdominal pain that can be controlled with medicine.  Vaginal  spotting.  Constipation.  Menopausal symptoms such as hot flashes, vaginal dryness, and mood swings. HOME CARE INSTRUCTIONS   Get plenty of rest and sleep.  Only take over-the-counter or prescription medicines as directed by your health care provider. Do not take aspirin. It can cause bleeding.  Keep incision areas clean and dry. Remove or change bandages (dressings) only as directed by your health care provider.  Take showers instead of baths for a few weeks as directed by your health care provider.  Limit exercise and activities as directed by your health care provider. Do not lift anything heavier than 5 pounds (2.3 kg) until your health care provider approves.  Do not drive until your health care provider approves.  Follow your health care provider's advice regarding diet. You may be able to resume your usual diet right away.  Drink enough fluids to keep your urine clear or pale yellow.  Do not douche, use tampons, or have sexual intercourse for 6 weeks after the procedure.  Do not drink alcohol until your health care provider says it is okay.  Take your temperature twice a day and write it down.  If you become constipated, you may:  Ask your health care provider about taking a mild laxative.  Add more fruit and bran to your diet.  Drink more fluids.  Follow up with your health care provider as directed. SEEK MEDICAL CARE IF:   You have swelling, redness, or increasing pain in the incision area.  You see  pus coming from the incision area.  You notice a bad smell coming from the wound or dressing.  You have pain, redness, or swelling where the IV access tube was placed.  Your incision is breaking open (the edges are not staying together).  You feel dizzy or feel like fainting.  You develop pain or bleeding when you urinate.  You develop diarrhea.  You develop nausea and vomiting.  You develop abnormal vaginal discharge.  You develop a rash.  You have  pain that is not controlled with medicine. SEEK IMMEDIATE MEDICAL CARE IF:   You develop a fever.  You develop abdominal pain.  You have chest pain.  You develop shortness of breath.  You pass out.  You develop pain, swelling, or redness in your leg.  You develop heavy vaginal bleeding with or without blood clots. Document Released: 04/18/2005 Document Revised: 12/19/2012 Document Reviewed: 10/10/2012 [facility] Patient Information 2015 [place], [place]. This information is not intended to replace advice given to you by your health care provider. Make sure you discuss any questions you have with your health care provider.

## 2013-10-22 NOTE — Anesthesia Postprocedure Evaluation (Signed)
Anesthesia Post Note  Patient: Robyn Aguilar  Procedure(s) Performed: Procedure(s) (LRB): ROBOTIC ASSISTED BILATERAL SALPINGO OOPHORECTOMY (Bilateral)  Anesthesia type: General  Patient location: PACU  Post pain: Pain level controlled  Post assessment: Post-op Vital signs reviewed  Last Vitals:  Filed Vitals:   10/22/13 1345  BP: 145/72  Pulse: 61  Temp:   Resp: 9    Post vital signs: Reviewed  Level of consciousness: sedated  Complications: No apparent anesthesia complications

## 2013-10-22 NOTE — Progress Notes (Signed)
Dr. Delsa Sale in to see patient and patient's husband, new orders received for patient to stay overnight.

## 2013-10-22 NOTE — Transfer of Care (Signed)
Immediate Anesthesia Transfer of Care Note  Patient: Robyn Aguilar  Procedure(s) Performed: Procedure(s) (LRB): ROBOTIC ASSISTED BILATERAL SALPINGO OOPHORECTOMY (Bilateral)  Patient Location: PACU  Anesthesia Type: General  Level of Consciousness: sedated, patient cooperative and responds to stimulation  Airway & Oxygen Therapy: Patient Spontanous Breathing and Patient connected to face mask oxgen  Post-op Assessment: Report given to PACU RN and Post -op Vital signs reviewed and stable  Post vital signs: Reviewed and stable  Complications: No apparent anesthesia complications

## 2013-10-22 NOTE — Progress Notes (Signed)
Dr. Glennon Mac Laurance Flatten made aware of patient's concerns about going home and that she will be alone for a period of time tonight- Dr. Delsa Sale stated for patient to go back to Short Stay and see how she does- and that patient is not to be discharged until patient  And Dr. Delsa Sale  See each other in Short Stay.

## 2013-10-22 NOTE — Op Note (Signed)
PATIENT: Robyn Aguilar DATE OF BIRTH: 18-May-1950 ENCOUNTER DATE: 10/22/13   Preop Diagnosis: Cystic adnexal mass, normal CA-125  Postoperative Diagnosis: Mucinous cystadenoma  Surgery: Robotic bilateral salpingo-oophorectomy, lysis of adhesions   Surgeons:  Navia Lindahl A. Alycia Rossetti, MD; Lahoma Crocker, MD; Everitt Amber, MD  Anesthesia: General   Estimated blood loss: 25  ml   IVF: 1000 ml   Urine output: 366 ml   Complications: None   Pathology: Bilateral tubes and ovaries  Operative findings: 8 cm cystic right adnexal mass. Omentum adhesed to the vaginal cuff on the left and to the rectosigmoid colon. Appendix adherent to the right ovarian mass and to the right pelvic side wall.  Normal abdominal survey otherwise. Frozen section consistent with a mucinous cystadenoma.  Procedure: The patient was identified in the preoperative holding area. Informed consent was signed on the chart. Patient was seen history was reviewed and exam was performed.   The patient was then taken to the operating room and placed in the supine position with SCD hose on. She was then placed in the dorsolithotomy position. Her arms were tucked at her side with appropriate precautions on the gel pad. General anesthesia was then induced without difficulty. Shoulder pads were then placed in the usual fashion with appropriate precautions. A tilt test was performed and there was no sliding on the bed. A OG-tube was placed to suction. The perineum was then prepped in the usual fashion with Betadine. A 14 French Foley was inserted into the bladder under sterile conditions. A sterile sponge stick was placed in the vagina. The abdomen was then prepped with 1 Chlor prep sponges per protocol.   Patient was then draped after the prep was dried. A timeout was performed to confirm the above. After again confirming OG tube placement and that it was to suction. A stab-wound was made in left upper quadrant 2 cm below the costal  margin on the left in the midclavicular line. A 5 mm operative report was used to assure intra-abdominal placement. The abdomen was insufflated. At this point all points during the procedure the patient's intra-abdominal pressure was not increased over 15 mm of mercury. After insufflation was complete, the patient was placed in deep Trendelenburg position. 25 cm above the pubic symphysis that area was marked the camera port. Bilateral robotic ports were marked 10 cm from the midline incision at approximately 5 angle. Under direct visualization each of the trochars was placed into the abdomen. The small bowel was folded on its mesentery to allow visualization to the pelvis. The 5 mm LUQ port was then converted to a 10/12 port under direct visualization.  After assuring adequate visualization, the robot was then docked in the usual fashion. Under direct visualization the robotic instruments replaced. Washings were obtained.  The adhesions of the appendix and cecum to the ovary were taken down with sharp dissection. The posterior leaf of the broad ligament was then taken down in the usual fashion. The ureter was identified on the medial leaf of the broad ligament. A window was made between the IP and the ureter. The IP was coagulated with bipolar cautery and transected. The posterior leaf of the broad ligament was taken down to the level of the vagina cuff. The round ligament was transected on her right side which freed up the mass. The mass was drained of its fluid, placed in an endocatch bag and delivered through the assistant port.   Our attention was drawn to the left side. The omental adhesions were  taken down sharply. The adhesions of the colon to the ovary were taken down. The posterior peritoneum was opened, the ureter was identified and a window was created. There was some fibrosis of the left pelvic sidewall. The IP was coagulated and transected. The ovary was separated from the peritoneum, placed in an  endocatch bag and delivered. Frozen section returned at this time and the procedure was concluded.  The pelvis was hemostatic, all pedicles were hemostatic. The ureters were freely peristalsing.  The robotic instruments were removed under direct visualization as were the robotic trochars. The pneumoperitoneum was removed. The patient was then taken out of the Trendelenburg position. Using of 0 Vicryl on a UR 6 needle the midline port fascia was closed after being grasped with allis clamps. The subcutaneous tissues of the port in the left upper quadrant was reapproximated. The skin was closed using 4-0 Vicryl. Dermabond was applied.   All instrument needle and Ray-Tec counts were correct x2. The patient tolerated the procedure well and was taken to the recovery room in stable condition. This is Robyn Aguilar dictating an operative note on patient Robyn Aguilar.

## 2013-10-22 NOTE — Progress Notes (Signed)
Patient willing to go to Short Stay - and will see Dr. Delsa Sale there.

## 2013-10-22 NOTE — Interval H&P Note (Signed)
History and Physical Interval Note:  10/22/2013 10:36 AM  Robyn Aguilar  has presented today for surgery, with the diagnosis of COMPLEX CYSTIC MASS  The various methods of treatment have been discussed with the patient and family. After consideration of risks, benefits and other options for treatment, the patient has consented to a robotic bilateral salpingo-oophorectomy, possible staging and possible laparotomy as a surgical intervention .  The patient's history has been reviewed, patient examined, no change in status, stable for surgery.  I have reviewed the patient's chart and labs.  Questions were answered to the patient's satisfaction.     [place] A.

## 2013-10-22 NOTE — H&P (View-Only) (Signed)
Consult Note: Gyn-Onc  Robyn Aguilar 63 y.o. female  CC:  Chief Complaint  Patient presents with  . Complex cystic mass    New Consult     HPI: Patient is seen today in consultation at the request of Dr. Harrington Challenger.  [name] is a 63 year old gravida 3 para 2 who about one to 2 months ago began experiencing some dyspareunia. She went for her anal examination with Dr. Harrington Challenger. At that time he appreciated a mass on exam. Ultrasound was performed on May 13. A 7.4 x 5.2 cm complex cystic mass at the apex of the vagina. CA 125 was drawn that was normal at 7. The patient comes in for evaluation.  She's otherwise been fairly asymptomatic. She does state that her bowels are slightly different. She states that previously he would be warranted constant stream of stool now she states that it is more disruptive. She describes it as "a smaller volumes". There is no applies change in caliber there is no blood. She denies any dyschezia. She gets tired going up stairs. She denies any current chest pain or shortness of breath. She believes that some of her fatigue is due to not exercising. She also believes that she is retaining fluid. Her weight today in clinic is 190 pounds. She states her usual weight is 178-179 pounds. She denies any nausea or vomiting. She denies any fevers or chills. She denies any early satiety though she states she is intentionally trying to eat less. Her primary care physician is Dr. Harlan Stains. Her cardiologist is Dr. Conchita Paris.   The patient underwent a right total knee replacement in October of 2014. Immediately after being discharged the next issue with to the emergency room and was diagnosed with heart failure probably due to diastolic dysfunction. Echocardiogram showed an ejection fraction of 70. Of note the patient had a negative coronary angiography 7 years ago for false positive stress test. She's been maintained on Lasix and other medications and recently saw  Dr. [name] about a month ago and was felt to be stable. Her last colonoscopy was 5 years ago. She's due for a repeat. She did have polyps that were benign. She's up-to-date on her mammograms.  Review of Systems:  Constitutional: Feels tired. Denies fever. Skin: No rash Cardiovascular: No chest pain, shortness of breath, or edema  Pulmonary: No cough  Gastro Intestinal: No nausea, vomiting, constipation, or diarrhea reported. No bright red blood per rectum or change in bowel movement except as in HPI. Genitourinary: No frequency, urgency, or dysuria.  Denies vaginal bleeding and discharge. + dyspareunia Musculoskeletal: Bilateral knee surgery Neurologic: No weakness Psychology: No changes   Current Meds:  Outpatient Encounter Prescriptions as of 09/24/2013  Medication Sig  . aspirin 81 MG tablet Take 3 tabs daily  . Cholecalciferol (VITAMIN D3) 3000 UNITS TABS Take by mouth. Take 2 tabs daily equal(2000mg ) daily  . furosemide (LASIX) 40 MG tablet Take 1/2 tab  Twice a day  . meperidine (DEMEROL) 50 MG tablet Take 50 mg by mouth every 4 (four) hours as needed for severe pain. Pt takes this for migraines  . potassium chloride SA (K-DUR,KLOR-CON) 20 MEQ tablet Take 0.5 tablets (10 mEq total) by mouth 2 (two) times daily.  . RABEprazole (ACIPHEX) 20 MG tablet Take 20 mg by mouth at bedtime.   . topiramate (TOPAMAX) 25 MG tablet Take two tabs at bedtime  . metoprolol succinate (TOPROL-XL) 100 MG 24 hr tablet Take 100 mg by mouth daily. Take with  or immediately following a meal.    Allergy:  Allergies  Allergen Reactions  . Codeine Other (See Comments)  . Latex Rash    Social Hx:   History   Social History  . Marital Status: Married    Spouse Name: N/A    Number of Children: 2  . Years of Education: college   Occupational History  .     Social History Main Topics  . Smoking status: Never Smoker   . Smokeless tobacco: Never Used  . Alcohol Use: No  . Drug Use: No  . Sexual  Activity: Yes    Birth Control/ Protection: Surgical   Other Topics Concern  . Not on file   Social History Narrative  . No narrative on file    Past Surgical Hx:  Past Surgical History  Procedure Laterality Date  . Carpel tunnel Right 1988  . Knee surgery Left 2011    replacement  . Disc surgery  1996  . Hernia repair  1987  . Abdominal hysterectomy  1991  . Colonoscopy    . Esophagogastroduodenoscopy    . Total knee arthroplasty Right   . Total knee arthroplasty Right 02/04/2013    Procedure: RIGHT TOTAL KNEE ARTHROPLASTY;  Surgeon: Vickey Huger, MD;  Location: [place];  Service: Orthopedics;  Laterality: Right;    Past Medical Hx:  Past Medical History  Diagnosis Date  . Migraine   . High cholesterol     but doesn't take any meds  . Allergic rhinitis   . TIA (transient ischemic attack)   . Migraines   . Hypertension     takes Metoprolol and maxzide daily  . Sleep apnea     sleep study in epic from 2014-uses CPAP  . Pneumonia     at age 15  . History of migraine     last one 6-16months ago;takes Topamax nightly  . Seizures     hx of;last one 63yrs ago;no meds required;states it was brought on by med;only one ever had  . Dizziness     takes Antivert prn  . Stroke     2 TIA  . Arthritis     knees and neck  . Joint pain   . Joint swelling   . GERD (gastroesophageal reflux disease)     takes Aciphex prn  . History of colon polyps   . Cataracts, bilateral     immature   . Shortness of breath 02/06/2013    Oncology Hx:   No history exists.    Family Hx:  Family History  Problem Relation Age of Onset  . Prostate cancer Other     Vitals:  Blood pressure 155/87, pulse 66, temperature 97.9 F (36.6 C), temperature source Oral, resp. rate 20, height 5\' 5"  (1.651 m), weight 190 lb 3.2 oz (86.274 kg).  Physical Exam:  Well-nourished well-developed female in no acute distress.  Neck: Well-healed surgical incision on the left side. Neck is supple, there's no  lymphadenopathy, no thyromegaly.  Lungs: Clear to auscultation bilaterally.  Cardiovascular: Regular rate and rhythm.  Abdomen: Well-healed transverse Pfannenstiel skin incision. Bilateral groin incisions are well-healed. Abdomen is soft, nontender, nondistended. There are no palpable masses or hepatosplenomegaly. There is no fluid wave.  Groins: No lymphadenopathy. No recurrent hernias.  Extremities: Well-healed surgical incisions in bilateral knees. No edema.  Pelvic: Normal female genitalia. Bimanual examination reveals a mass measuring 5 cm at the top of the vaginal apex. It is tender to palpation. Bimanual examination mass  is not mobile. There is no nodularity. Rectal confirms. A mass is felt better on vaginal examination than on rectal examination.  Assessment/Plan: 63 year old with most likely an ovarian cystic neoplasm that is adherent to the vaginal cuff. I explained how this could happen to both her and her husband. I discussed with them the likelihood that this is a malignancy is less than 5% based on the sonographic appearance, the CA 125, and her physical examination. I believe that the mass does need to be surgically removed. The patient wishes to proceed with surgery. 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 I discussed with the tentative surgical date is June 23. I discussed with her and her husband that we could proceed in a minimally invasive fashion..  Risks of surgery including but not limited to bleeding, infection, injury to surrounding organs including blood vessels, nerves, ureters, and bowel were discussed with the patient. We discussed the need for Trendelenburg positioning and abdominal insufflation. We need to speak to both her cardiologist as well as her primary care physician to ensure that there is  no other perioperative risk stratification procedures that need to proceed prior to her surgery date. She understands that she'll need to have a preoperative visit as well as potential perioperative testing based on the recommendations of her other physicians.  Her questions as well as those of her husband were elicited and answered to their satisfaction. She has my card and will contact me should any questions prior to surgery date. I will contact her after speaking to her cardiologist.  Imagene Gurney A. Alycia Rossetti, MD 09/24/2013, 2:51 PM

## 2013-10-23 ENCOUNTER — Encounter (HOSPITAL_COMMUNITY): Payer: Self-pay | Admitting: Gynecologic Oncology

## 2013-10-23 NOTE — Discharge Summary (Signed)
Physician Discharge Summary  Patient ID: Robyn Aguilar MRN: [id number] DOB/AGE: 1951-02-09 63 y.o.  Admit date: 10/22/2013 Discharge date: 10/23/2013  Admission Diagnoses: Active Problems:   Pelvic mass in female  Discharge Diagnoses:  Active Problems:   Pelvic mass in female   Discharged Condition: stable  Hospital Course: On 10/22/2013 - 10/23/2013, the patient underwent the following: Procedure(s): ROBOTIC ASSISTED BILATERAL SALPINGO OOPHORECTOMY.   The postoperative course was remarkable for a migraine headache.  She was discharged to home on postoperative day 1 tolerating a regular diet.  Consults: None  Significant Diagnostic Studies: None  Treatments: surgery: see above  Discharge Exam: Blood pressure 120/73, pulse 73, temperature 98 F (36.7 C), temperature source Oral, resp. rate 18, height 5' 4.5" (1.638 m), weight 84.56 kg (186 lb 6.7 oz), SpO2 97.00%. General appearance: alert Resp: clear to auscultation bilaterally Cardio: regular rate and rhythm, S1, S2 normal, no murmur, click, rub or gallop GI: soft, non-tender; bowel sounds normal; no masses,  no organomegaly Extremities: extremities normal, atraumatic, no cyanosis or edema and Homans sign is negative, no sign of DVT Incision/Wound: C/D/I  Disposition: 01-Home or Self Care  Discharge Instructions   Call MD for:  extreme fatigue    Complete by:  As directed      Call MD for:  extreme fatigue    Complete by:  As directed      Call MD for:  persistant dizziness or light-headedness    Complete by:  As directed      Call MD for:  persistant dizziness or light-headedness    Complete by:  As directed      Call MD for:  persistant nausea and vomiting    Complete by:  As directed      Call MD for:  persistant nausea and vomiting    Complete by:  As directed      Call MD for:  redness, tenderness, or signs of infection (pain, swelling, redness, odor or green/yellow discharge around incision site)     Complete by:  As directed      Call MD for:  redness, tenderness, or signs of infection (pain, swelling, redness, odor or green/yellow discharge around incision site)    Complete by:  As directed      Call MD for:  severe uncontrolled pain    Complete by:  As directed      Call MD for:  severe uncontrolled pain    Complete by:  As directed      Call MD for:  temperature >100.4    Complete by:  As directed      Call MD for:  temperature >100.4    Complete by:  As directed      Diet - low sodium heart healthy    Complete by:  As directed      Diet - low sodium heart healthy    Complete by:  As directed      Discharge wound care:    Complete by:  As directed   Keep clean and dry     Discharge wound care:    Complete by:  As directed   Keep clean and dry     Driving Restrictions    Complete by:  As directed   No driving while taking narcotics     Driving Restrictions    Complete by:  As directed   No driving for 1 week     Increase activity slowly    Complete by:  As  directed      Increase activity slowly    Complete by:  As directed      Lifting restrictions    Complete by:  As directed   No lifting > 5 lbs for 6 weeks     May walk up steps    Complete by:  As directed      May walk up steps    Complete by:  As directed      Sexual Activity Restrictions    Complete by:  As directed   No intercourse for 6 weeks     Sexual Activity Restrictions    Complete by:  As directed   No intercourse for 6  weeks            Medication List         aspirin 81 MG tablet  Take 3 tabs daily     cetirizine 10 MG tablet  Commonly known as:  ZYRTEC  Take 10 mg by mouth daily.     cholecalciferol 1000 UNITS tablet  Commonly known as:  VITAMIN D  Take 2,000 Units by mouth daily.     fluticasone 50 MCG/ACT nasal spray  Commonly known as:  FLONASE  Place 1 spray into both nostrils every morning.     furosemide 40 MG tablet  Commonly known as:  LASIX  Take 40 mg by mouth 2  (two) times daily.     meperidine 50 MG tablet  Commonly known as:  DEMEROL  Take 50 mg by mouth every 4 (four) hours as needed for severe pain. Pt takes this for migraines     metoprolol succinate 100 MG 24 hr tablet  Commonly known as:  TOPROL-XL  Take 100 mg by mouth every morning. Take with or immediately following a meal.     oxyCODONE-acetaminophen 5-325 MG per tablet  Commonly known as:  PERCOCET/ROXICET  Take 1-2 tablets by mouth every 6 (six) hours as needed for moderate pain or severe pain.     potassium chloride SA 20 MEQ tablet  Commonly known as:  K-DUR,KLOR-CON  Take 20 mEq by mouth daily.     RABEprazole 20 MG tablet  Commonly known as:  ACIPHEX  Take 20 mg by mouth daily as needed (Acid reflux).     topiramate 25 MG tablet  Commonly known as:  TOPAMAX  Take 50 mg by mouth at bedtime.           Follow-up Information   Schedule an appointment as soon as possible for a visit with [facility] A., MD.   Specialty:  Gynecologic Oncology   Contact information:   [address] [address] [phone]       Signed: Agnes Lawrence 10/23/2013, 10:13 AM

## 2013-10-29 ENCOUNTER — Telehealth (INDEPENDENT_AMBULATORY_CARE_PROVIDER_SITE_OTHER): Payer: Self-pay | Admitting: Gynecologic Oncology

## 2013-10-29 NOTE — Telephone Encounter (Signed)
TC to patient. She is doing well.  We discussed her pathology reports. She is very pleased. PG

## 2013-12-05 ENCOUNTER — Encounter: Payer: Self-pay | Admitting: Gynecologic Oncology

## 2013-12-05 ENCOUNTER — Ambulatory Visit: Payer: BC Managed Care – PPO | Attending: Gynecologic Oncology | Admitting: Gynecologic Oncology

## 2013-12-05 DIAGNOSIS — D279 Benign neoplasm of unspecified ovary: Secondary | ICD-10-CM | POA: Insufficient documentation

## 2013-12-05 DIAGNOSIS — N839 Noninflammatory disorder of ovary, fallopian tube and broad ligament, unspecified: Secondary | ICD-10-CM

## 2013-12-05 DIAGNOSIS — N838 Other noninflammatory disorders of ovary, fallopian tube and broad ligament: Secondary | ICD-10-CM

## 2013-12-05 DIAGNOSIS — Z9079 Acquired absence of other genital organ(s): Secondary | ICD-10-CM | POA: Insufficient documentation

## 2013-12-05 NOTE — Patient Instructions (Signed)
Follow up yearly with your GYN. Please call our office if you have any concerns in the future.

## 2013-12-05 NOTE — Progress Notes (Signed)
  HPI:  Robyn Aguilar is a 63 y.o. year old No obstetric history on file. initially seen in consultation on 10/04/13 for a complex adnexal mass in a postmenopausal woman.  She then underwent a robotic assisted bilateral salpingo-oophorectomy and lysis of adhesions on 08/13/22 without complications.  Her postoperative course was uncomplicated.  Her final pathology revealed right ovarian mucinous cystadenoma (no borderline change or malignancy) and an unremarkable left tube and ovary.  She is seen today for a postoperative check and to discuss her pathology results and ongoing plan.  Since discharge from the hospital, she is feeling well with no pain.  She has improving appetite, normal bowel and bladder function, and pain controlled with no PO medication. She has no other complaints today.    Review of systems: Constitutional:  She has no weight gain or weight loss. She has no fever or chills. Eyes: No blurred vision Ears, Nose, Mouth, Throat: No dizziness, headaches or changes in hearing. No mouth sores. Cardiovascular: No chest pain, palpitations or edema. Respiratory:  No shortness of breath, wheezing or cough Gastrointestinal: She has normal bowel movements without diarrhea or constipation. She denies any nausea or vomiting. She denies blood in her stool or heart burn. Genitourinary:  She denies pelvic pain, pelvic pressure or changes in her urinary function. She has no hematuria, dysuria, or incontinence. She has no irregular vaginal bleeding or vaginal discharge Musculoskeletal: Denies muscle weakness or joint pains.  Skin:  She has no skin changes, rashes or itching Neurological:  Denies dizziness or headaches. No neuropathy, no numbness or tingling. Psychiatric:  She denies depression or anxiety. Hematologic/Lymphatic:   No easy bruising or bleeding   Physical Exam: Vitals: Blood pressure 151/78, temperature 98.4, pulse 62 beats per minute, rest rate 16 breaths per minute, weight  194 pounds, height 5 foot 4 inches. General: Well dressed, well nourished in no apparent distress.   HEENT:  Normocephalic and atraumatic, no lesions.  Extraocular muscles intact. Sclerae anicteric. Pupils equal, round, reactive. No mouth sores or ulcers. Thyroid is normal size, not nodular, midline. Skin:  No lesions or rashes. Breasts:  Soft, symmetric.  No skin or nipple changes.  No palpable LN or masses. Lungs:  Clear to auscultation bilaterally.  No wheezes. Cardiovascular:  Regular rate and rhythm.  No murmurs or rubs. Abdomen:  Soft, nontender, nondistended.  No palpable masses.  No hepatosplenomegaly.  No ascites. Normal bowel sounds.  No hernias.  Incisions are well healed and nontender. Genitourinary: deferred Extremities: No cyanosis, clubbing or edema.  No calf tenderness or erythema. No palpable cords. Psychiatric: Mood and affect are appropriate. Neurological: Awake, alert and oriented x 3. Sensation is intact, no neuropathy.  Musculoskeletal: No pain, normal strength and range of motion.  Assessment:    63 y.o. year old with a history of a right ovarian mucinous cystadenoma.   S/p robotic assisted laparoscopic BSO and lysis of adhesions on 10/22/13.   Plan: 1) Pathology reports reviewed today 2) Treatment counseling - no further followup of ovarian pathology required. She was given the opportunity to ask questions, which were answered to her satisfaction, and she is agreement with the above mentioned plan of care.  3)  Return to see Dr Harrington Challenger for annual well-woman care q 12 monthly or prn.  Donaciano Eva, MD

## 2014-01-02 ENCOUNTER — Ambulatory Visit: Payer: BC Managed Care – PPO | Admitting: Interventional Cardiology

## 2014-03-13 ENCOUNTER — Encounter: Payer: Self-pay | Admitting: Interventional Cardiology

## 2014-03-13 ENCOUNTER — Ambulatory Visit (INDEPENDENT_AMBULATORY_CARE_PROVIDER_SITE_OTHER): Payer: BC Managed Care – PPO | Admitting: Interventional Cardiology

## 2014-03-13 VITALS — BP 132/78 | HR 61 | Ht 64.0 in | Wt 200.0 lb

## 2014-03-13 DIAGNOSIS — I5032 Chronic diastolic (congestive) heart failure: Secondary | ICD-10-CM

## 2014-03-13 DIAGNOSIS — I10 Essential (primary) hypertension: Secondary | ICD-10-CM

## 2014-03-13 DIAGNOSIS — E785 Hyperlipidemia, unspecified: Secondary | ICD-10-CM

## 2014-03-13 NOTE — Patient Instructions (Signed)
Your physician recommends that you continue on your current medications as directed. Please refer to the Current Medication list given to you today.  Your physician wants you to follow-up in: 1 year. You will receive a reminder letter in the mail two months in advance. If you don't receive a letter, please call our office to schedule the follow-up appointment.  

## 2014-03-13 NOTE — Progress Notes (Signed)
Patient ID: Robyn Aguilar, female   DOB: 1951/02/09, 63 y.o.   MRN: [id number]    [address], [address], [place]  [address] Phone: [phone] Fax:  [phone]  Date:  03/13/2014   ID:  Robyn Aguilar, DOB 1951/04/17, MRN [id number]  PCP:  Vidal Schwalbe, MD   ASSESSMENT:  1. Hypertension, controlled, essential. 2. Chronic diastolic heart failure, stable and controlled 3. Sleep apnea, controlled  PLAN:  1. Clinical follow-up in one year. No change in current medical management   SUBJECTIVE: Robyn Aguilar is a 63 y.o. female enrolled in a boot can. Still has some dyspnea but this is improving with conditioning. She is not had chest pain, orthopnea, PND, or edema. She denies palpitations.   Wt Readings from Last 3 Encounters:  03/13/14 200 lb (90.719 kg)  10/22/13 186 lb 6.7 oz (84.56 kg)  10/18/13 188 lb (85.276 kg)     Past Medical History  Diagnosis Date  . Migraine   . High cholesterol     but doesn't take any meds  . Allergic rhinitis   . TIA (transient ischemic attack)   . Migraines   . Hypertension     takes Metoprolol and maxzide daily  . Sleep apnea     sleep study in epic from 2014-uses CPAP  . Pneumonia     at age 32  . History of migraine     last one 6-23months ago;takes Topamax nightly  . Seizures     hx of;last one 39yrs ago;no meds required;states it was brought on by med;only one ever had  . Dizziness     takes Antivert prn  . Stroke     2 TIA  . Arthritis     knees and neck  . Joint pain   . Joint swelling   . GERD (gastroesophageal reflux disease)     takes Aciphex prn  . History of colon polyps   . Cataracts, bilateral     immature   . Shortness of breath 02/06/2013  . Complication of anesthesia     had heart failure after knee surgery 02/04/13    Current Outpatient Prescriptions  Medication Sig Dispense Refill  . aspirin 81 MG tablet Take 81 mg by mouth daily.    . cholecalciferol  (VITAMIN D) 1000 UNITS tablet Take 3,000 Units by mouth daily.    . furosemide (LASIX) 40 MG tablet Take 40 mg by mouth.    . meperidine (DEMEROL) 50 MG tablet Take 50 mg by mouth every 4 (four) hours as needed for severe pain.    . metoprolol succinate (TOPROL-XL) 100 MG 24 hr tablet Take 100 mg by mouth daily. Take with or immediately following a meal.    . potassium chloride SA (K-DUR,KLOR-CON) 20 MEQ tablet Take 20 mEq by mouth 2 (two) times daily.    . RABEprazole (ACIPHEX) 20 MG tablet Take 20 mg by mouth daily.    [name] topiramate (TOPAMAX) 25 MG tablet Take 25 mg by mouth 2 (two) times daily.     No current facility-administered medications for this visit.    Allergies:    Allergies  Allergen Reactions  . Codeine Other (See Comments)  . Adhesive [Tape]     Rash  . Latex Rash    Social History:  The patient  reports that she has never smoked. She has never used smokeless tobacco. She reports that she does not drink alcohol or use illicit drugs.   ROS:  Please see  the history of present illness.   No neurological complaints. Denies cough and hemoptysis.   All other systems reviewed and negative.   OBJECTIVE: VS:  BP 132/78 mmHg  Pulse 61  Ht 5\' 4"  (1.626 m)  Wt 200 lb (90.719 kg)  BMI 34.31 kg/m2 Well nourished, well developed, in no acute distress, mildly obese and appearing younger than stated age 17: normal Neck: JVD flat. Carotid bruit absent  Cardiac:  normal S1, S2; RRR; no murmur Lungs:  clear to auscultation bilaterally, no wheezing, rhonchi or rales Abd: soft, nontender, no hepatomegaly Ext: Edema absent. Pulses 2+ Skin: warm and dry Neuro:  CNs 2-12 intact, no focal abnormalities noted  EKG:  Left anterior hemiblock otherwise unremarkable.       Signed, Illene Labrador III, MD 03/13/2014 3:13 PM

## 2015-02-23 ENCOUNTER — Telehealth: Payer: Self-pay | Admitting: Interventional Cardiology

## 2015-02-23 NOTE — Telephone Encounter (Signed)
New Message       Pt calling stating that she is having a surgery on her rotator cuff on 03/12/15 and needs to see Dr. [name] prior to that. Pt was notified that Dr. [name] doesn't have any available appt's prior to then and that we could schedule her with a PA to get her cardiac clearance. Pt states she doesn't want to see a PA. Please call back and advise.

## 2015-02-24 NOTE — Telephone Encounter (Signed)
Spoke with pt and she was willing to see a PA or NP on a day that Dr. [name] was in the office. Scheduled pt to see Richardson Dopp, PA-C on 10/27 at 12:10PM.

## 2015-02-25 ENCOUNTER — Telehealth: Payer: Self-pay | Admitting: *Deleted

## 2015-02-25 NOTE — Telephone Encounter (Signed)
Form from [facility] received and placed in [employer]. Cardiac clearance requested for surgery. Patient has appointment 02/26/15 w/ Nicki Reaper.

## 2015-02-25 NOTE — Progress Notes (Addendum)
Cardiology Office Note   Date:  02/26/2015   ID:  Robyn Aguilar, DOB 1950/06/08, MRN [id number]  PCP:  Vidal Schwalbe, MD  Cardiologist:  Dr. Daneen Schick   Electrophysiologist:  [facility] Surgeon:  Dr. Hulda Humphrey, Eva PA (phone [phone]; fax [phone])  Chief Complaint  Patient presents with  . Other    Surgical Clearance  . Congestive Heart Failure     History of Present Illness: Robyn Aguilar is a 64 y.o. female with a hx of chronic diastolic HF, HTN, OSA, HL, prior TIA. Last seen by Dr. [name] 11/15.    The patient needs R rotator cuff surgery and returns for surgical clearance.  Since last seen she has been doing well. She is frustrated by weight gain.  She notes dyspnea with more moderate to extreme activities. She does try to walk. She denies any chest pain. She denies any limitations with ADLs or routine activities. She denies LE edema, syncope.  She sleeps with CPAP.  She denies cough, wheezing.    Studies/Reports Reviewed Today:  Myoview 12/14 Normal stress nuclear study. LV Ejection Fraction: 83%.  Echo 10/14 Moderate LVH, vigorous LVF, EF 65-70%, normal wall motion, grade 1 diastolic dysfunction, mild MR, small effusion  LHC 5/07 FINAL IMPRESSION: Normal coronary angiography.    Past Medical History  Diagnosis Date  . Migraine   . High cholesterol     but doesn't take any meds  . Allergic rhinitis   . TIA (transient ischemic attack)   . Migraines   . Hypertension     takes Metoprolol and maxzide daily  . Sleep apnea     sleep study in epic from 2014-uses CPAP  . Pneumonia     at age 80  . History of migraine     last one 6-43months ago;takes Topamax nightly  . Seizures ([place])     hx of;last one 39yrs ago;no meds required;states it was brought on by med;only one ever had  . Dizziness     takes Antivert prn  . Stroke [facility])     2 TIA  . Arthritis     knees and neck  . Joint pain   .  Joint swelling   . GERD (gastroesophageal reflux disease)     takes Aciphex prn  . History of colon polyps   . Cataracts, bilateral     immature   . Shortness of breath 02/06/2013  . Complication of anesthesia     had heart failure after knee surgery 02/04/13    Past Surgical History  Procedure Laterality Date  . Carpel tunnel Right 1988  . Knee surgery Left 2011    replacement-partial  . Disc surgery  1996  . Abdominal hysterectomy  1991  . Colonoscopy    . Esophagogastroduodenoscopy    . Total knee arthroplasty Right   . Total knee arthroplasty Right 02/04/2013    Procedure: RIGHT TOTAL KNEE ARTHROPLASTY;  Surgeon: Vickey Huger, MD;  Location: [place];  Service: Orthopedics;  Laterality: Right;  . Hernia repair  1987    x 2  . Robotic assisted bilateral salpingo oopherectomy Bilateral 10/22/2013    Procedure: ROBOTIC ASSISTED BILATERAL SALPINGO OOPHORECTOMY;  Surgeon: Imagene Gurney A. Alycia Rossetti, MD;  Location: [facility];  Service: Gynecology;  Laterality: Bilateral;     Current Outpatient Prescriptions  Medication Sig Dispense Refill  . aspirin 81 MG tablet Take 81 mg by mouth daily.    . cetirizine (ZYRTEC) 10 MG tablet Take 10 mg  by mouth daily.    . cholecalciferol (VITAMIN D) 1000 UNITS tablet Take 2,000 Units by mouth daily.     . cyanocobalamin 500 MCG tablet Take 1,000 mcg by mouth daily.    . fluticasone (FLONASE) 50 MCG/ACT nasal spray Place 1 spray into both nostrils daily.    . meclizine (ANTIVERT) 12.5 MG tablet Take 12.5 mg by mouth 3 (three) times daily as needed.    . meloxicam (MOBIC) 15 MG tablet Take 15 mg by mouth as needed.   2  . meperidine (DEMEROL) 50 MG tablet Take 50 mg by mouth every 4 (four) hours as needed for severe pain.    . metoprolol succinate (TOPROL-XL) 100 MG 24 hr tablet Take 100 mg by mouth daily. Take with or immediately following a meal.    . naproxen (NAPROSYN) 500 MG tablet Take 500 mg by mouth 2 (two) times daily as needed.  1  . RABEprazole  (ACIPHEX) 20 MG tablet Take 20 mg by mouth daily.    [name] topiramate (TOPAMAX) 25 MG tablet Take 25 mg by mouth 2 (two) times daily.    . traMADol (ULTRAM) 50 MG tablet Take 50 mg by mouth daily as needed for moderate pain.   0  . triamterene-hydrochlorothiazide (MAXZIDE) 75-50 MG tablet Take 1 tablet by mouth daily.  1   No current facility-administered medications for this visit.    Allergies:   Codeine; Adhesive; and Latex    Social History:   Social History   Social History  . Marital Status: Married    Spouse Name: N/A  . Number of Children: 2  . Years of Education: college   Occupational History  .     Social History Main Topics  . Smoking status: Never Smoker   . Smokeless tobacco: Never Used  . Alcohol Use: No  . Drug Use: No  . Sexual Activity: Yes    Birth Control/ Protection: Surgical   Other Topics Concern  . None   Social History Narrative     Family History:   Family History  Problem Relation Age of Onset  . Prostate cancer Other       ROS:   Please see the history of present illness.   Review of Systems  Constitution: Positive for malaise/fatigue and weight gain.  Cardiovascular: Positive for dyspnea on exertion.  All other systems reviewed and are negative.      PHYSICAL EXAM: VS:  BP 120/78 mmHg  Pulse 73  Ht 5\' 4"  (1.626 m)  Wt 203 lb (92.08 kg)  BMI 34.83 kg/m2  SpO2 97%    Wt Readings from Last 3 Encounters:  02/26/15 203 lb (92.08 kg)  03/13/14 200 lb (90.719 kg)  10/22/13 186 lb 6.7 oz (84.56 kg)     GEN: Well nourished, well developed, in no acute distress HEENT: normal Neck: no JVD,  no masses Cardiac:  Normal S1/S2, RRR; no murmur ,  no rubs or gallops, no edema   Respiratory:  clear to auscultation bilaterally, no wheezing, rhonchi or rales. GI: soft, nontender, nondistended, + BS MS: no deformity or atrophy Skin: warm and dry  Neuro:  CNs II-XII intact, Strength and sensation are intact Psych: Normal affect   EKG:   EKG is ordered today.  It demonstrates:   NSR, HR 73, LAD, NSSTTW changes, no change from prior tracing.    Recent Labs: No results found for requested labs within last 365 days.    Lipid Panel    Component  Value Date/Time   CHOL 170 02/07/2013 0650   TRIG 120 02/07/2013 0650   HDL 16* 02/07/2013 0650   CHOLHDL 10.6 02/07/2013 0650   VLDL 24 02/07/2013 0650   LDLCALC 130* 02/07/2013 0650      ASSESSMENT AND PLAN:  1. Surgical Clearance:  The patient does not have any unstable cardiac conditions.  Upon evaluation today, she can achieve 4 METs or greater without anginal symptoms.  According to [facility] and AHA guidelines, she requires no further cardiac workup prior to her noncardiac surgery and should be at acceptable risk.    2. Chronic Diastolic CHF:  Volume stable on thiazide diuretics. She had issues with volume overload after orthopedic surgery 2 years ago requiring diuresis.  She will need close attention paid to her volume status in the perioperative period.   3. HTN:  Controlled.   4. OSA:  Continue CPAP.      Medication Changes: Current medicines are reviewed at length with the patient today.  Concerns regarding medicines are as outlined above.  The following changes have been made:   Discontinued Medications   FUROSEMIDE (LASIX) 40 MG TABLET    Take 40 mg by mouth.   POTASSIUM CHLORIDE SA (K-DUR,KLOR-CON) 20 MEQ TABLET    Take 20 mEq by mouth 2 (two) times daily.   Modified Medications   No medications on file   New Prescriptions   No medications on file   Labs/ tests ordered today include:   Orders Placed This Encounter  Procedures  . EKG 12-Lead      Disposition:    FU with Dr. Daneen Schick 6 mos.     Signed, Versie Starks, MHS 02/26/2015 1:15 PM    [facility] [place], [place],   [address] Phone: [phone]; Fax: [phone]

## 2015-02-26 ENCOUNTER — Encounter: Payer: Self-pay | Admitting: Physician Assistant

## 2015-02-26 ENCOUNTER — Ambulatory Visit (INDEPENDENT_AMBULATORY_CARE_PROVIDER_SITE_OTHER): Payer: BC Managed Care – PPO | Admitting: Physician Assistant

## 2015-02-26 VITALS — BP 120/78 | HR 73 | Ht 64.0 in | Wt 203.0 lb

## 2015-02-26 DIAGNOSIS — Z01810 Encounter for preprocedural cardiovascular examination: Secondary | ICD-10-CM | POA: Diagnosis not present

## 2015-02-26 DIAGNOSIS — I5032 Chronic diastolic (congestive) heart failure: Secondary | ICD-10-CM | POA: Diagnosis not present

## 2015-02-26 DIAGNOSIS — I10 Essential (primary) hypertension: Secondary | ICD-10-CM

## 2015-02-26 DIAGNOSIS — G4733 Obstructive sleep apnea (adult) (pediatric): Secondary | ICD-10-CM | POA: Diagnosis not present

## 2015-02-26 NOTE — Patient Instructions (Signed)
Medication Instructions:  Your physician recommends that you continue on your current medications as directed. Please refer to the Current Medication list given to you today.   Labwork: NONE  Testing/Procedures: NONE  Follow-Up: Your physician wants you to follow-up in: [place] [name].  You will call you two months in advance to schedule an appt.  Any Other Special Instructions Will Be Listed Below (If Applicable).   If you need a refill on your cardiac medications before your next appointment, please call your pharmacy.

## 2015-04-01 ENCOUNTER — Ambulatory Visit: Payer: BC Managed Care – PPO | Admitting: Interventional Cardiology

## 2015-08-10 ENCOUNTER — Ambulatory Visit (INDEPENDENT_AMBULATORY_CARE_PROVIDER_SITE_OTHER): Payer: BC Managed Care – PPO | Admitting: Neurology

## 2015-08-10 ENCOUNTER — Encounter: Payer: Self-pay | Admitting: Neurology

## 2015-08-10 VITALS — BP 145/85 | HR 81 | Resp 14 | Ht 64.0 in | Wt 206.0 lb

## 2015-08-10 DIAGNOSIS — Z9989 Dependence on other enabling machines and devices: Principal | ICD-10-CM

## 2015-08-10 DIAGNOSIS — G4733 Obstructive sleep apnea (adult) (pediatric): Secondary | ICD-10-CM

## 2015-08-10 NOTE — Progress Notes (Signed)
SLEEP MEDICINE CLINIC   Provider:  Larey Seat, M D  Referring Provider: Harlan Stains, MD Primary Care Physician:  Vidal Schwalbe, MD  Chief Complaint  Patient presents with  . New Evaluation    Sleep eval. With husband.     HPI:  Robyn Aguilar is a 65 y.o. female , seen here as a referral from Dr. Dema Severin for a re- evaluation of sleep apnea.  [name] reports that she is not sleeping well and that the CPAP machine she is currently using seems to have nothing to do with it. She has not gotten new supplies through [facility] including the nasal pillow. She brought her machine with her today. I would like to "from the patient's sleep study dated 09/28/2012. At the time the patient was 65 years of age and underwent a baseline recent mammography which revealed an AHI of 17.3 during REM sleep the AHI was 34.1 and in supine sleep 26.7. The lowest oxygen desaturation was at 83% with only 4.7 minutes of desaturation time. There were no significant periodic limb movements noted. The patient kindly agreed to return for a CPAP titration on 10/26/2012 and at that time was titrated to 6 cm water she slept over now her at this pressure with an AHI of 0.0 and an oxygen nadir of 90%. She reports having compliantly used her machine. She noted air leaks at the nasal pillow.  She is followed by [facility] is her durable medical equipment company, we were able today to obtain a download from her machine but this encompasses the time of November into early December 2016. She has used the machine on average 7 hours and 13 minutes with an 83% compliance and a setting of 6 cm water with 2 cm expiratory pressure relief. Her residual AHI was 2.7. Based on these 70-month-old data she was doing very well with her machine and the machine controlled her apnea well. She has to see her provider today that she will need a prescription to get for the supplies,  but she also needs her  CPAP to be evaluated and she needs a new date chip . The patient is new to Medicare. She retires on the 1. May 2017   Chief complaint according to patient : Her chief problem is the leakage of air at the nostril level.  Sleep habits are as follows:  After work she comes home , she sleeps in a recliner ( had rotator cuff surgery ) she has PT/ OT in afternoons.She goes to sleep at 9.30 PM and goes to urinate at 2 AM, mostly returns to sleep quickly. Average sleep time over night is 6.5 hours.  She feels the air leaking on the side of her nostrils. Every time she will get a new nasal pillow she has an acute sense that the air leaks are diminished but once she has them for a  couple of weeks, the air leak returned.  Leaves at 5.30 AM for work. 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   Social history: recent rotator cuff surgery, on meloxicam prn.   Review of Systems: Out of a complete 14 system review, the patient complains of only the following symptoms, and all other reviewed systems are negative.   Epworth score 9, Fatigue severity score 35.   Social History   Social History  . Marital Status: Married    Spouse Name: N/A  . Number of Children: 2  . Years  of Education: college   Occupational History  .     Social History Main Topics  . Smoking status: Never Smoker   . Smokeless tobacco: Never Used  . Alcohol Use: No  . Drug Use: No  . Sexual Activity: Yes    Birth Control/ Protection: Surgical   Other Topics Concern  . Not on file   Social History Narrative   Lives with husband   Caffeine use:    Family History  Problem Relation Age of Onset  . Prostate cancer Other     Past Medical History  Diagnosis Date  . Migraine   . High cholesterol     but doesn't take any meds  . Allergic rhinitis   . TIA (transient ischemic attack)   . Migraines   . Hypertension     takes Metoprolol and maxzide daily  . Sleep apnea     sleep study in epic from  2014-uses CPAP  . Pneumonia     at age 63  . History of migraine     last one 6-71months ago;takes Topamax nightly  . Seizures ([place])     hx of;last one 82yrs ago;no meds required;states it was brought on by med;only one ever had  . Dizziness     takes Antivert prn  . Stroke [facility])     2 TIA  . Arthritis     knees and neck  . Joint pain   . Joint swelling   . GERD (gastroesophageal reflux disease)     takes Aciphex prn  . History of colon polyps   . Cataracts, bilateral     immature   . Shortness of breath 02/06/2013  . Complication of anesthesia     had heart failure after knee surgery 02/04/13    Past Surgical History  Procedure Laterality Date  . Carpel tunnel Right 1988  . Knee surgery Left 2011    replacement-partial  . Disc surgery  1996  . Abdominal hysterectomy  1991  . Colonoscopy    . Esophagogastroduodenoscopy    . Total knee arthroplasty Right   . Total knee arthroplasty Right 02/04/2013    Procedure: RIGHT TOTAL KNEE ARTHROPLASTY;  Surgeon: Vickey Huger, MD;  Location: [place];  Service: Orthopedics;  Laterality: Right;  . Hernia repair  1987    x 2  . Robotic assisted bilateral salpingo oopherectomy Bilateral 10/22/2013    Procedure: ROBOTIC ASSISTED BILATERAL SALPINGO OOPHORECTOMY;  Surgeon: Imagene Gurney A. Alycia Rossetti, MD;  [facility];  Service: Gynecology;  Laterality: Bilateral;    Current Outpatient Prescriptions  Medication Sig Dispense Refill  . aspirin 81 MG tablet Take 81 mg by mouth daily.    . cetirizine (ZYRTEC) 10 MG tablet Take 10 mg by mouth daily.    . cholecalciferol (VITAMIN D) 1000 UNITS tablet Take 2,000 Units by mouth daily.     . cyanocobalamin 500 MCG tablet Take 1,000 mcg by mouth daily.    . fluticasone (FLONASE) 50 MCG/ACT nasal spray Place 1 spray into both nostrils daily.    . meloxicam (MOBIC) 15 MG tablet Take 15 mg by mouth as needed.   2  . meperidine (DEMEROL) 50 MG tablet Take 50 mg by mouth every 4 (four) hours as needed for severe  pain.    . metoprolol succinate (TOPROL-XL) 100 MG 24 hr tablet Take 100 mg by mouth daily. Take with or immediately following a meal.    . RABEprazole (ACIPHEX) 20 MG tablet Take 20 mg by mouth  daily.    . topiramate (TOPAMAX) 25 MG tablet Take 25 mg by mouth 2 (two) times daily.    [name] triamterene-hydrochlorothiazide (MAXZIDE) 75-50 MG tablet Take 1 tablet by mouth daily.  1  . meclizine (ANTIVERT) 12.5 MG tablet Take 12.5 mg by mouth 3 (three) times daily as needed. Reported on 08/10/2015     No current facility-administered medications for this visit.    Allergies as of 08/10/2015 - Review Complete 08/10/2015  Allergen Reaction Noted  . Codeine Other (See Comments) 09/12/2012  . Adhesive [tape]  10/15/2013  . Latex Rash 03/08/2013    Vitals: BP 145/85 mmHg  Pulse 81  Resp 14  Ht 5\' 4"  (1.626 m)  Wt 206 lb (93.441 kg)  BMI 35.34 kg/m2 Last Weight:  Wt Readings from Last 1 Encounters:  08/10/15 206 lb (93.441 kg)   [id number] mass index is 35.34 kg/(m^2).     Last Height:   Ht Readings from Last 1 Encounters:  08/10/15 5\' 4"  (1.626 m)    Physical exam:  General: The patient is awake, alert and appears not in acute distress. The patient is well groomed. Head: Normocephalic, atraumatic. Neck is supple. Mallampati 3,  neck circumference:14.25 . Nasal airflow restricted , TMJ is evident . Retrognathia is seen.  Cardiovascular:  Regular rate and rhythm , without  murmurs or carotid bruit, and without distended neck veins. Respiratory: Lungs are clear to auscultation. Skin:  Without evidence of edema, or rash Trunk: BMI is elevated - The patient's posture is erect.   Neurologic exam : The patient is awake and alert, oriented to place and time.   Memory subjective described as intact.  Attention span & concentration ability appears normal.  Speech is fluent,  without dysarthria, dysphonia or aphasia.  Mood and affect are appropriate.  Cranial nerves: Pupils are equal and  briskly reactive to light.  Funduscopic exam without evidence of pallor or edema.  Extraocular movements  in vertical and horizontal planes intact and without nystagmus. Visual fields by finger perimetry are intact. Hearing to finger rub intact. Facial sensation intact to fine touch. Facial motor strength is symmetric and tongue and uvula move midline. Shoulder shrug was symmetrical.   Motor exam:  Normal tone, muscle bulk and symmetric strength in all extremities. Sensory:  Fine touch, pinprick and vibration were tested in all extremities. Proprioception tested in the upper extremities was normal. Coordination: Rapid alternating movements in the fingers/hands was normal. Finger-to-nose maneuver  normal without evidence of ataxia, dysmetria or tremor. Gait and station: Patient walks without assistive device and is able unassisted to climb up to the exam table. Strength within normal limits.  Stance is stable and normal.   Deep tendon reflexes: in the  upper and lower extremities are symmetric and intact. Babinski maneuver response is  downgoing.  The patient was advised of the nature of the diagnosed sleep disorder , the treatment options and risks for general a health and wellness arising from not treating the condition.  I spent more than 25 minutes of face to face time with the patient. Greater than 50% of time was spent in counseling and coordination of care. We have discussed the diagnosis and differential and I answered the patient's questions.     Assessment:  After physical and neurologic examination, review of laboratory studies,  Personal review of imaging studies, reports of other /same  Imaging studies ,  Results of polysomnography/ neurophysiology testing and pre-existing records as far as provided in visit., my assessment  is   1) looking at the CPAP downloads provided from November and December 2016 I see no reason to change any of the settings. The patient's apnea is well controlled  with a residual AHI of 2.4 and she has been highly compliant. I recommend that [facility] gives her on a regular schedule CPAP supplies.  2) I do not see a need for retesting. At this time the patient is still undergoing the aftermath of her shoulder surgery she cannot easily rest and her usual bed but sleeps in a recliner she has been taking very little pain medication which I find beneficial. There is therefore no overlap from pain medication on muscle relaxants into her degree of sleepiness. She gets about 6-1/2 hours of sleep and she functions well with that. Her temporary increased sleepiness and fatigue will undoubtedly improve as she heals further from her surgery.  3) RV once a year, DME [facility] in [place], [address]. Asencion Partridge Van Ehlert MD  08/10/2015   CC: Harlan Stains, [address], [place] [address]

## 2015-08-11 ENCOUNTER — Telehealth: Payer: Self-pay | Admitting: Neurology

## 2015-08-11 NOTE — Telephone Encounter (Signed)
I spoke to pt and advised her that the order was sent to [facility] but it could take them a few days to process the order and [facility] should contact the pt for further instructions on how to receive supplies. Pt verbalized understanding.

## 2015-08-11 NOTE — Telephone Encounter (Signed)
Patient is calling and states that she is at [facility] in [place] to pick up a head piece that was susposed to be ordered for her but is not there.  Phone number: [phone]. Fax #: [phone]. Contact Ivin Booty.

## 2015-08-13 ENCOUNTER — Encounter: Payer: Self-pay | Admitting: Neurology

## 2015-10-20 DIAGNOSIS — I421 Obstructive hypertrophic cardiomyopathy: Secondary | ICD-10-CM | POA: Insufficient documentation

## 2015-10-20 NOTE — Progress Notes (Signed)
Cardiology Office Note    Date:  10/21/2015   ID:  Robyn Aguilar, DOB 1951-02-07, MRN [id number]  PCP:  Vidal Schwalbe, MD  Cardiologist: Sinclair Grooms, MD   Chief Complaint  Patient presents with  . Shortness of Breath    History of Present Illness:  Robyn Aguilar is a 65 y.o. female left ventricular hypertrophy, hypertension, and hyperlipidemia.  Overall Robyn Aguilar is doing well. She has started a walking program. She has some shortness of breath and beginning but this is improving. She has no chest discomfort or significant dyspnea. She is concerned about lower extremity swelling.    Past Medical History  Diagnosis Date  . Migraine   . High cholesterol     but doesn't take any meds  . Allergic rhinitis   . TIA (transient ischemic attack)   . Migraines   . Hypertension     takes Metoprolol and maxzide daily  . Sleep apnea     sleep study in epic from 2014-uses CPAP  . Pneumonia     at age 42  . History of migraine     last one 6-29months ago;takes Topamax nightly  . Seizures ([place])     hx of;last one 65yrs ago;no meds required;states it was brought on by med;only one ever had  . Dizziness     takes Antivert prn  . Stroke [facility])     2 TIA  . Arthritis     knees and neck  . Joint pain   . Joint swelling   . GERD (gastroesophageal reflux disease)     takes Aciphex prn  . History of colon polyps   . Cataracts, bilateral     immature   . Shortness of breath 02/06/2013  . Complication of anesthesia     had heart failure after knee surgery 02/04/13    Past Surgical History  Procedure Laterality Date  . Carpel tunnel Right 1988  . Knee surgery Left 2011    replacement-partial  . Disc surgery  1996  . Abdominal hysterectomy  1991  . Colonoscopy    . Esophagogastroduodenoscopy    . Total knee arthroplasty Right   . Total knee arthroplasty Right 02/04/2013    Procedure: RIGHT TOTAL KNEE ARTHROPLASTY;  Surgeon: Vickey Huger,  MD;  Location: [place];  Service: Orthopedics;  Laterality: Right;  . Hernia repair  1987    x 2  . Robotic assisted bilateral salpingo oopherectomy Bilateral 10/22/2013    Procedure: ROBOTIC ASSISTED BILATERAL SALPINGO OOPHORECTOMY;  Surgeon: Imagene Gurney A. Alycia Rossetti, MD;  Location: [facility];  Service: Gynecology;  Laterality: Bilateral;    Current Medications: Outpatient Prescriptions Prior to Visit  Medication Sig Dispense Refill  . aspirin 81 MG tablet Take 81 mg by mouth daily.    . cetirizine (ZYRTEC) 10 MG tablet Take 10 mg by mouth daily.    . cholecalciferol (VITAMIN D) 1000 UNITS tablet Take 2,000 Units by mouth daily.     . cyanocobalamin 500 MCG tablet Take 1,000 mcg by mouth daily.    . fluticasone (FLONASE) 50 MCG/ACT nasal spray Place 1 spray into both nostrils daily.    . meclizine (ANTIVERT) 12.5 MG tablet Take 12.5 mg by mouth 3 (three) times daily as needed. Reported on 08/10/2015    . meloxicam (MOBIC) 15 MG tablet Take 15 mg by mouth as needed.   2  . meperidine (DEMEROL) 50 MG tablet Take 50 mg by mouth every 4 (four) hours as needed for severe  pain.    . metoprolol succinate (TOPROL-XL) 100 MG 24 hr tablet Take 100 mg by mouth daily. Take with or immediately following a meal.    . RABEprazole (ACIPHEX) 20 MG tablet Take 20 mg by mouth daily.    [name] topiramate (TOPAMAX) 25 MG tablet Take 25 mg by mouth 2 (two) times daily.    [name] triamterene-hydrochlorothiazide (MAXZIDE) 75-50 MG tablet Take 1 tablet by mouth daily.  1   No facility-administered medications prior to visit.     Allergies:   Codeine; Adhesive; and Latex   Social History   Social History  . Marital Status: Married    Spouse Name: N/A  . Number of Children: 2  . Years of Education: college   Occupational History  .     Social History Main Topics  . Smoking status: Never Smoker   . Smokeless tobacco: Never Used  . Alcohol Use: No  . Drug Use: No  . Sexual Activity: Yes    Birth Control/ Protection: Surgical    Other Topics Concern  . None   Social History Narrative   Lives with husband   Caffeine use:     Family History:  The patient's family history includes Prostate cancer in her other.   ROS:   Please see the history of present illness.    Unexplained weight gain. Occasional migraines, and question concerning right lower extremity edema.  All other systems reviewed and are negative.   PHYSICAL EXAM:   VS:  BP 134/84 mmHg  Pulse 61  Ht 5\' 4"  (1.626 m)  Wt 207 lb (93.895 kg)  BMI 35.51 kg/m2   GEN: Well nourished, well developed, in no acute distress HEENT: normal Neck: no JVD, carotid bruits, or masses Cardiac: RRR; no murmurs, rubs, or gallops,no edema  Respiratory:  clear to auscultation bilaterally, normal work of breathing GI: soft, nontender, nondistended, + BS MS: no deformity or atrophy Skin: warm and dry, no rash Neuro:  Alert and Oriented x 3, Strength and sensation are intact Psych: euthymic mood, full affect  Wt Readings from Last 3 Encounters:  10/21/15 207 lb (93.895 kg)  08/10/15 206 lb (93.441 kg)  02/26/15 203 lb (92.08 kg)      Studies/Labs Reviewed:   EKG:  EKG  Is not performed today  Recent Labs: No results found for requested labs within last 365 days.   Lipid Panel    Component Value Date/Time   CHOL 170 02/07/2013 0650   TRIG 120 02/07/2013 0650   HDL 16* 02/07/2013 0650   CHOLHDL 10.6 02/07/2013 0650   VLDL 24 02/07/2013 0650   LDLCALC 130* 02/07/2013 0650    Additional studies/ records that were reviewed today include:  No new data    ASSESSMENT:    1. Chronic diastolic heart failure ([place])   2. Essential hypertension   3. Hypertrophic obstructive cardiomyopathy ([place])      PLAN:  In order of problems listed above:  1. No evidence of volume overload. Low salt diet and blood pressure control are discussed. 2. Same discussion as above concerning volume control, salt control, and weight control. I encouraged physical  activity.    Medication Adjustments/Labs and Tests Ordered: Current medicines are reviewed at length with the patient today.  Concerns regarding medicines are outlined above.  Medication changes, Labs and Tests ordered today are listed in the Patient Instructions below. There are no Patient Instructions on file for this visit.   Signed, Sinclair Grooms, MD  10/21/2015 8:31 AM    [address], [place], [place]  [address] Phone: [phone]; Fax: [phone]

## 2015-10-21 ENCOUNTER — Ambulatory Visit (INDEPENDENT_AMBULATORY_CARE_PROVIDER_SITE_OTHER): Payer: BC Managed Care – PPO | Admitting: Interventional Cardiology

## 2015-10-21 ENCOUNTER — Encounter: Payer: Self-pay | Admitting: Interventional Cardiology

## 2015-10-21 DIAGNOSIS — I5032 Chronic diastolic (congestive) heart failure: Secondary | ICD-10-CM | POA: Diagnosis not present

## 2015-10-21 DIAGNOSIS — I10 Essential (primary) hypertension: Secondary | ICD-10-CM

## 2015-10-21 DIAGNOSIS — I421 Obstructive hypertrophic cardiomyopathy: Secondary | ICD-10-CM

## 2015-10-21 NOTE — Patient Instructions (Signed)
Medication Instructions:   NO CHANGE  Follow-Up:  Your physician wants you to follow-up in: [place] will receive a reminder letter in the mail two months in advance. If you don't receive a letter, please call our office to schedule the follow-up appointment.   If you need a refill on your cardiac medications before your next appointment, please call your pharmacy.

## 2016-08-09 ENCOUNTER — Ambulatory Visit: Payer: BC Managed Care – PPO | Admitting: Neurology

## 2016-09-06 ENCOUNTER — Encounter: Payer: Medicare Other | Attending: Family Medicine | Admitting: Dietician

## 2016-09-06 ENCOUNTER — Encounter: Payer: Self-pay | Admitting: Dietician

## 2016-09-06 DIAGNOSIS — Z713 Dietary counseling and surveillance: Secondary | ICD-10-CM | POA: Diagnosis not present

## 2016-09-06 DIAGNOSIS — Z6836 Body mass index (BMI) 36.0-36.9, adult: Secondary | ICD-10-CM | POA: Insufficient documentation

## 2016-09-06 DIAGNOSIS — E119 Type 2 diabetes mellitus without complications: Secondary | ICD-10-CM | POA: Diagnosis not present

## 2016-09-06 NOTE — Progress Notes (Signed)
Patient was seen on 09/06/16 for the first of a series of three diabetes self-management courses at [facility].  Patient Education Plan per assessed needs and concerns is to attend four course education program for Diabetes Self Management Education.  The following learning objectives were met by the patient during this class:  Describe diabetes  State some common risk factors for diabetes  Defines the role of glucose and insulin  Identifies type of diabetes and pathophysiology  Describe the relationship between diabetes and cardiovascular risk  State the members of the Healthcare Team  States the rationale for glucose monitoring  State when to test glucose  State their individual Target Range  State the importance of logging glucose readings  Describe how to interpret glucose readings  Identifies A1C target  Explain the correlation between A1c and eAG values  State symptoms and treatment of high blood glucose  State symptoms and treatment of low blood glucose  Explain proper technique for glucose testing  Identifies proper sharps disposal  Handouts given during class include:  Living Well with Diabetes book  Carb Counting and Meal Planning book  Meal Plan Card  Carbohydrate guide  Meal planning worksheet  Low Sodium Flavoring Tips  The diabetes portion plate  J1B to eAG Conversion Chart  Diabetes Medications  Diabetes Recommended Care Schedule  Support Group  Diabetes Success Plan  Core Class Satisfaction Survey  Follow-Up Plan:  Attend core 2

## 2016-09-13 ENCOUNTER — Encounter: Payer: Medicare Other | Admitting: Dietician

## 2016-09-13 DIAGNOSIS — E119 Type 2 diabetes mellitus without complications: Secondary | ICD-10-CM

## 2016-09-13 NOTE — Progress Notes (Signed)

## 2016-09-20 ENCOUNTER — Encounter: Payer: Medicare Other | Admitting: Dietician

## 2016-09-20 DIAGNOSIS — E119 Type 2 diabetes mellitus without complications: Secondary | ICD-10-CM

## 2016-09-20 NOTE — Progress Notes (Signed)
Patient was seen on 09/20/16 for the third of a series of three diabetes self-management courses at the [facility].   Robyn Aguilar the amount of activity recommended for healthy living . Describe activities suitable for individual needs . Identify ways to regularly incorporate activity into daily life . Identify barriers to activity and ways to over come these barriers  Identify diabetes medications being personally used and their primary action for lowering glucose and possible side effects . Describe role of stress on blood glucose and develop strategies to address psychosocial issues . Identify diabetes complications and ways to prevent them  Explain how to manage diabetes during illness . Evaluate success in meeting personal goal . Establish 2-3 goals that they will plan to diligently work on until they return for the  26-month follow-up visit  Goals:   I will count my carb choices at most meals and snacks  I will be active 15 minutes or more 5 times a week   I will ride my stationary bike and make myself work in the yard with my husband.  I will eat less unhealthy fats by eating less ice cream. (Eat ice cream maybe once per week rather than every evening.)  To help manage stress I will listen to calm music or read a book at least 5 times a week  Your patient has identified these potential barriers to change:  Motivation- will try to get up every time a commercial comes on Stress  Your patient has identified their diabetes self-care support plan as  Silver sneaker classes and participate more with weight management Plan:  Attend Support Group as desired

## 2016-11-23 ENCOUNTER — Encounter: Payer: Self-pay | Admitting: Neurology

## 2016-11-24 ENCOUNTER — Encounter: Payer: Self-pay | Admitting: Neurology

## 2016-11-24 ENCOUNTER — Ambulatory Visit (INDEPENDENT_AMBULATORY_CARE_PROVIDER_SITE_OTHER): Payer: Medicare Other | Admitting: Neurology

## 2016-11-24 VITALS — BP 111/70 | HR 59 | Ht 64.0 in | Wt 206.0 lb

## 2016-11-24 DIAGNOSIS — Z9989 Dependence on other enabling machines and devices: Secondary | ICD-10-CM

## 2016-11-24 DIAGNOSIS — G4733 Obstructive sleep apnea (adult) (pediatric): Secondary | ICD-10-CM

## 2016-11-24 NOTE — Progress Notes (Signed)
SLEEP MEDICINE CLINIC   Provider:  Larey Seat, M D  Referring Provider: Harlan Stains, MD Primary Care Physician:  Harlan Stains, MD  Chief Complaint  Patient presents with  . Follow-up  CPAP compliance visit.   HPI:  Robyn Aguilar is a 66 year old African-American lady but recently retired, and has been on CPAP after being diagnosed with obstructive sleep apnea at [facility] sleep. Her baseline AHI was 17.3 but exacerbated during REM sleep to 34.1 and in supine sleep her AHI was 26.7 she did not have significant oxygen desaturations, no significant periodic limb movements and she return for CPAP titration on 10/26/2012. She used CPAP at 6 cm water pressure, which is rather low. She continued to use her CPAP but had problems with the machine being broken, being sent off for repairs and still feels that this is not a reliable CPAP machine. She was told by [facility] [place] that her machine cannot be replaced as it is not 66 years old. She wasn't even given a loaner machine for the time that her CPAP was in repair. The patient has used the machine over the last 90 days 77% of the time for over 4 hours nightly. This time includes a period when she had shoulder surgery and the period when she couldn't use her machine because it was broken. She also had knee replacement surgery. CPAP is still set at 6 cm water pressure with 2 cm EPR, the patient is an AHI of 1.1, her average usage is 6 hours and 9 minutes there is no need to adjust pressures. She purchased a clean machine . She loves her CPAP- she sleeps better , she is rested and restored.     Robyn Aguilar is a 66 y.o. female , seen here as a referral from Dr. Dema Severin for a re- evaluation of sleep apnea.  [name] reports that she is not sleeping well and that the CPAP machine she is currently using seems to have nothing to do with it. She has not gotten new supplies through [facility] including the nasal pillow. She brought her machine with her today. I would like to "from the patient's sleep study dated 09/28/2012. At the time the patient was 66 years of age and underwent a baseline PSG which revealed an AHI of 17.3 during REM sleep the AHI was 34.1 and in supine sleep 26.7. The lowest oxygen desaturation was at 83% with only 4.7 minutes of desaturation time. There were no significant periodic limb movements noted. The patient kindly agreed to return for a CPAP titration on 10/26/2012 and at that time was titrated to 6 cm water she slept over now her at this pressure with an AHI of 0.0 and an oxygen nadir of 90%. She reports having compliantly used her machine. She noted air leaks at the nasal pillow.  She is followed by [facility] is her durable medical equipment company, we were able today to obtain a download from her machine but this encompasses the time of November into early December 2016. She has used the machine on average 7 hours and 13 minutes with an 83% compliance and a setting of 6 cm water with 2 cm expiratory pressure relief. Her residual AHI was 2.7. Based on these 80-month-old data she was doing very well with her machine and the machine controlled her apnea well. She has to see her provider today that she will need a prescription to get for the supplies,  but she also needs her CPAP to be evaluated and she needs a new date chip .  Chief complaint according to patient : Her chief problem is the leakage of air at the nostril level. Sleep habits are as follows:  After work she comes home , she sleeps in a recliner ( had rotator cuff surgery ) she has PT/ OT in afternoons.She goes to sleep at 9.30 PM and goes to urinate at 2 AM, mostly returns to sleep quickly. Average sleep time over night is 6.5 hours.  She feels the air leaking on the side of her nostrils. Every time she will get a new nasal pillow she has an acute sense that the air leaks are diminished but  once she has them for a  couple of weeks, the air leak returned. Leaves at 5.30 AM for work. 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  Social history: recent rotator cuff surgery, on meloxicam prn.   Review of Systems: Out of a complete 14 system review, the patient complains of only the following symptoms, and all other reviewed systems are negative.   Epworth score 9, Fatigue severity score 35.   Social History   Social History  . Marital status: Married    Spouse name: N/A  . Number of children: 2  . Years of education: college   Occupational History  .  [employer]   Social History Main Topics  . Smoking status: Never Smoker  . Smokeless tobacco: Never Used  . Alcohol use No  . Drug use: No  . Sexual activity: Yes    Birth control/ protection: Surgical   Other Topics Concern  . Not on file   Social History Narrative   Lives with husband   Caffeine use:    Family History  Problem Relation Age of Onset  . Prostate cancer Other     Past Medical History:  Diagnosis Date  . Allergic rhinitis   . Arthritis    knees and neck  . Cataracts, bilateral    immature   . Complication of anesthesia    had heart failure after knee surgery 02/04/13  . Diabetes mellitus without complication ([place])   . Dizziness    takes Antivert prn  . GERD (gastroesophageal reflux disease)    takes Aciphex prn  . High cholesterol    but doesn't take any meds  . History of colon polyps   . History of migraine    last one 6-80months ago;takes Topamax nightly  . Hypertension    takes Metoprolol and maxzide daily  . Joint pain   . Joint swelling   . Migraine   . Migraines   . Pneumonia    at age 1  . Seizures ([place])    hx of;last one 43yrs ago;no meds required;states it was brought on by med;only one ever had  . Shortness of breath 02/06/2013  . Sleep apnea    sleep study in epic from 2014-uses CPAP  . Stroke [facility])    2 TIA  . TIA (transient ischemic  attack)     Past Surgical History:  Procedure Laterality Date  . ABDOMINAL HYSTERECTOMY  1991  . carpel tunnel Right 1988  . COLONOSCOPY    . disc surgery  1996  . ESOPHAGOGASTRODUODENOSCOPY    . HERNIA REPAIR  1987   x 2  . KNEE SURGERY Left 2011   replacement-partial  . ROBOTIC ASSISTED BILATERAL SALPINGO OOPHERECTOMY Bilateral 10/22/2013   Procedure:  ROBOTIC ASSISTED BILATERAL SALPINGO OOPHORECTOMY;  Surgeon: Imagene Gurney A. Alycia Rossetti, MD;  [facility];  Service: Gynecology;  Laterality: Bilateral;  . TOTAL KNEE ARTHROPLASTY Right   . TOTAL KNEE ARTHROPLASTY Right 02/04/2013   Procedure: RIGHT TOTAL KNEE ARTHROPLASTY;  Surgeon: Vickey Huger, MD;  Location: [place];  Service: Orthopedics;  Laterality: Right;    Current Outpatient Prescriptions  Medication Sig Dispense Refill  . aspirin 81 MG tablet Take 81 mg by mouth daily.    . cetirizine (ZYRTEC) 10 MG tablet Take 10 mg by mouth daily.    . cholecalciferol (VITAMIN D) 1000 UNITS tablet Take 2,000 Units by mouth daily.     . Cinnamon 500 MG TABS Take by mouth.    . cyanocobalamin 500 MCG tablet Take 1,000 mcg by mouth daily.    . fluticasone (FLONASE) 50 MCG/ACT nasal spray Place 1 spray into both nostrils daily.    . meclizine (ANTIVERT) 12.5 MG tablet Take 12.5 mg by mouth 3 (three) times daily as needed. Reported on 08/10/2015    . meperidine (DEMEROL) 50 MG tablet Take 50 mg by mouth every 4 (four) hours as needed for severe pain.    . metoprolol succinate (TOPROL-XL) 100 MG 24 hr tablet Take 100 mg by mouth daily. Take with or immediately following a meal.    . RABEprazole (ACIPHEX) 20 MG tablet Take 20 mg by mouth daily.    . Red Yeast Rice 600 MG CAPS Take by mouth.    . triamterene-hydrochlorothiazide (MAXZIDE) 75-50 MG tablet Take 1 tablet by mouth daily.  1   No current facility-administered medications for this visit.     Allergies as of 11/24/2016 - Review Complete 11/24/2016  Allergen Reaction Noted  . Codeine Other  (See Comments) 09/12/2012  . Adhesive [tape]  10/15/2013  . Latex Rash 03/08/2013    Vitals: BP 111/70   Pulse (!) 59   Ht 5\' 4"  (1.626 m)   Wt 206 lb (93.4 kg)   BMI 35.36 kg/m  Last Weight:  Wt Readings from Last 1 Encounters:  11/24/16 206 lb (93.4 kg)   PPI:RJJO mass index is 35.36 kg/m.     Last Height:   Ht Readings from Last 1 Encounters:  11/24/16 5\' 4"  (1.626 m)    Physical exam:  General: The patient is awake, alert and appears not in acute distress. The patient is well groomed. Head: Normocephalic, atraumatic. Neck is supple. Mallampati 3,  neck circumference:14.25 . Nasal airflow restricted , TMJ is evident . Retrognathia is seen.  Cardiovascular:  Regular rate and rhythm , without  murmurs or carotid bruit, and without distended neck veins. Respiratory: Lungs are clear to auscultation. Skin:  Without evidence of edema, or rash Trunk: BMI is elevated - The patient's posture is erect.   Neurologic exam : The patient is awake and alert, oriented to place and time.   Cranial nerves:Pupils are equal and briskly reactive to light. Visual fields by finger perimetry are intact.Hearing to finger rub intact. Facial sensation intact to fine touch.  Facial motor strength is symmetric and tongue and uvula move midline. Shoulder shrug was symmetrical.  Deep tendon reflexes: in the  upper and lower extremities are symmetric and intact. Babinski maneuver response is  downgoing.  The patient was advised of the nature of the diagnosed sleep disorder , the treatment options and risks for general a health and wellness arising from not treating the condition.  I spent more than 25 minutes of face to face  time with the patient. Greater than 50% of time was spent in counseling and coordination of care. We have discussed the diagnosis and differential and I answered the patient's questions.     Assessment:  After physical and neurologic examination, review of laboratory studies,   Personal review of imaging studies, reports of other /same  Imaging studies ,  Results of polysomnography/ neurophysiology testing and pre-existing records as far as provided in visit., my assessment is   1) OSA- keep on using CPAP- current settings.  2) Migraine-No longer on topiramate for headaches. She tapered of sucessfully.  3) cervicalgia , status post cervical fusion.    RV once a year, DME is still [facility] in [place], [address].  Asencion Partridge Takiyah Bohnsack MD  11/24/2016   CC: Harlan Stains, [address], [place] [address]

## 2017-06-12 ENCOUNTER — Telehealth: Payer: Self-pay

## 2017-06-12 ENCOUNTER — Telehealth: Payer: Self-pay | Admitting: Interventional Cardiology

## 2017-06-12 NOTE — Telephone Encounter (Signed)
   Primary Cardiologist:[name], MD  Chart reviewed as part of pre-operative protocol coverage. Because of Robyn Aguilar's past medical history and time since last visit, he/she will require a follow-up visit in order to better assess preoperative cardiovascular risk.  Pre-op covering staff: - Please schedule appointment and call patient to inform them. - Please contact requesting surgeon's office via preferred method (i.e, phone, fax) to inform them of need for appointment prior to surgery.  Lyda Jester, PA-C  06/12/2017, 4:08 PM

## 2017-06-12 NOTE — Telephone Encounter (Signed)
Walk in pt Form-[facility] Clearance dropped off placed in Triage Box.

## 2017-06-12 NOTE — Telephone Encounter (Signed)
   [facility] Pre-operative Risk Assessment    Request for surgical clearance:  1. What type of surgery is being performed? Right L 2/3 disectomy   2. When is this surgery scheduled? TBD   3. Are there any medications that need to be held prior to surgery and how long?Not stated   4. Practice name and name of physician performing surgery? [facility]   5. What is your office phone and fax number? Ph [phone] fax [phone]   6. Anesthesia type (None, local, MAC, general) ? Not stated   Tod Persia 06/12/2017, 1:21 PM  _________________________________________________________________   (provider comments below)

## 2017-06-13 ENCOUNTER — Other Ambulatory Visit: Payer: Self-pay | Admitting: Family Medicine

## 2017-06-13 DIAGNOSIS — N289 Disorder of kidney and ureter, unspecified: Secondary | ICD-10-CM

## 2017-06-19 ENCOUNTER — Ambulatory Visit
Admission: RE | Admit: 2017-06-19 | Discharge: 2017-06-19 | Disposition: A | Discharge status: Home / Self Care | Payer: Medicare Other | Source: Ambulatory Visit | Attending: Family Medicine | Admitting: Family Medicine

## 2017-06-19 DIAGNOSIS — N289 Disorder of kidney and ureter, unspecified: Secondary | ICD-10-CM

## 2017-06-21 ENCOUNTER — Ambulatory Visit: Payer: Medicare Other | Admitting: Cardiology

## 2017-06-21 ENCOUNTER — Encounter: Payer: Self-pay | Admitting: Cardiology

## 2017-06-21 VITALS — BP 116/70 | HR 82 | Ht 64.0 in | Wt 211.4 lb

## 2017-06-21 DIAGNOSIS — I10 Essential (primary) hypertension: Secondary | ICD-10-CM

## 2017-06-21 NOTE — Progress Notes (Addendum)
06/21/2017 Robyn Aguilar   January 15, 1951  [id number]  Primary Physician Harlan Stains, MD Primary Cardiologist: Dr. [name]   Reason for Visit/CC: Pre-operative Evaluation/ Surgical Clearance   HPI:  Robyn Aguilar is a 67 y.o. female who is being seen today for preoperative evaluation/ surgical clearance at the request of Dr. Rolena Infante, Orthopedic surgeon. Pt is scheduled to undergo right diskectomy, L2-L3 In March.   Pt has not been seen by our practice in several years. Her last OV was in 2017. She was being followed by Dr. [name] for left ventricular hypertrophy, hypertension, and hyperlipidemia. In 2014, she had a myocardial perfusion study that showed no ischemia. She also had an echocardiogram in 2014 that showed normal LVEF, 65-70%, with no WMA. She was noted to have moderate LVH with G1DD. No significant valvular abnormalities were noted at that time.   Pt is followed regularly by her PCP at [facility]. Dr. Dema Severin follows her BP and cholesterol. She is on Crestor but can only tolerate every other day due to prior issues with intolerance associated with daily use of statins. Her BP has been well controlled and is 116/70 today. She denies any cardiac symptoms since hier last OV. No chest pain or dyspnea. She denies exertional symptoms or limitations. Despite her chronic LBP, she is able to ambulate a flight of stairs w/o exertional CP or dyspnea (equivalent to > 4 METS of physical activity). She also walks at the mall (2 laps = 1 mile) and participates in water aerobics w/o cardiac symptoms. She also denies syncope/ near syncope, palpitations, LEE, orthopnea and PND.   I've reviews labs from PCP office from 2018. Renal function was normal and SCr was less than 2.0. Pt does not have DM, thus not on insulin therapy. She does report a h/o TIA in 42. No known h/o ischemic HD or CHF.   Based on the Revised Cardiac Risk Index which takes into account the following  listed below: her estimated risk of adverse outcome with non-cardiac surgery is: Very Low Risk. Estimated Rate of MI, PE, VF, Cardiac Arrest or complete heart block is 0.9%  High Risk Surgery: No CAD: no  CHF: Yes Cerebrovascular Dz: No DM on Insulin: No SCr > 2: No    Cardiac Studies Echocardiogram 02/07/13 Study Conclusions  - Left ventricle: The cavity size was normal. There was moderate concentric hypertrophy. Systolic function was vigorous. The estimated ejection fraction was in the range of 65% to 70%. Wall motion was normal; there were no regional wall motion abnormalities. Doppler parameters are consistent with abnormal left ventricular relaxation (grade 1 diastolic dysfunction). - Mitral valve: Mild regurgitation. Valve area by pressure half-time: 1.56cm^2. - Pericardium, extracardiac: A small pericardial effusion was identified.  Current Meds  Medication Sig  . aspirin 81 MG tablet Take 81 mg by mouth daily.  . cetirizine (ZYRTEC) 10 MG tablet Take 10 mg by mouth daily.  . cholecalciferol (VITAMIN D) 1000 units tablet Take 1,000 Units by mouth daily.  . cyanocobalamin 500 MCG tablet Take 1,000 mcg by mouth daily.  . fluticasone (FLONASE) 50 MCG/ACT nasal spray Place 1 spray into both nostrils daily as needed for allergies or rhinitis.   . meperidine (DEMEROL) 50 MG tablet Take 50 mg by mouth every 4 (four) hours as needed for severe pain.  . metoprolol succinate (TOPROL-XL) 100 MG 24 hr tablet Take 50 mg by mouth daily. Take with or immediately following a meal.   . RABEprazole (ACIPHEX) 20 MG tablet Take  20 mg by mouth daily.  . rosuvastatin (CRESTOR) 5 MG tablet Take 5 mg by mouth every other day.   . triamterene-hydrochlorothiazide (MAXZIDE) 75-50 MG tablet Take 1 tablet by mouth daily.  . TURMERIC PO Take 1 tablet by mouth every other day.  . [DISCONTINUED] cholecalciferol (VITAMIN D) 1000 UNITS tablet Take 2,000 Units by mouth daily.   .  [DISCONTINUED] Cinnamon 500 MG TABS Take by mouth.  . [DISCONTINUED] meclizine (ANTIVERT) 12.5 MG tablet Take 12.5 mg by mouth 3 (three) times daily as needed. Reported on 08/10/2015  . [DISCONTINUED] Red Yeast Rice 600 MG CAPS Take by mouth.   Allergies  Allergen Reactions  . Codeine Other (See Comments)  . Adhesive [Tape]     Rash  . Latex Rash   Past Medical History:  Diagnosis Date  . Allergic rhinitis   . Arthritis    knees and neck  . Cataracts, bilateral    immature   . Complication of anesthesia    had heart failure after knee surgery 02/04/13  . Diabetes mellitus without complication ([place])   . Dizziness    takes Antivert prn  . GERD (gastroesophageal reflux disease)    takes Aciphex prn  . High cholesterol    but doesn't take any meds  . History of colon polyps   . History of migraine    last one 6-19months ago;takes Topamax nightly  . Hypertension    takes Metoprolol and maxzide daily  . Joint pain   . Joint swelling   . Migraine   . Migraines   . Pneumonia    at age 84  . Seizures ([place])    hx of;last one 9yrs ago;no meds required;states it was brought on by med;only one ever had  . Shortness of breath 02/06/2013  . Sleep apnea    sleep study in epic from 2014-uses CPAP  . Stroke [facility])    2 TIA  . TIA (transient ischemic attack)    Family History  Problem Relation Age of Onset  . Prostate cancer Other    Past Surgical History:  Procedure Laterality Date  . ABDOMINAL HYSTERECTOMY  1991  . carpel tunnel Right 1988  . COLONOSCOPY    . disc surgery  1996  . ESOPHAGOGASTRODUODENOSCOPY    . HERNIA REPAIR  1987   x 2  . KNEE SURGERY Left 2011   replacement-partial  . ROBOTIC ASSISTED BILATERAL SALPINGO OOPHERECTOMY Bilateral 10/22/2013   Procedure: ROBOTIC ASSISTED BILATERAL SALPINGO OOPHORECTOMY;  Surgeon: Imagene Gurney A. Alycia Rossetti, MD;  Location: [facility];  Service: Gynecology;  Laterality: Bilateral;  . TOTAL KNEE ARTHROPLASTY Right   . TOTAL KNEE ARTHROPLASTY  Right 02/04/2013   Procedure: RIGHT TOTAL KNEE ARTHROPLASTY;  Surgeon: Vickey Huger, MD;  Location: [place];  Service: Orthopedics;  Laterality: Right;   Social History   Socioeconomic History  . Marital status: Married    Spouse name: Not on file  . Number of children: 2  . Years of education: college  . Highest education level: Not on file  Social Needs  . Financial resource strain: Not on file  . Food insecurity - worry: Not on file  . Food insecurity - inability: Not on file  . Transportation needs - medical: Not on file  . Transportation needs - non-medical: Not on file  Occupational History    Employer: [employer]  Tobacco Use  . Smoking status: Never Smoker  . Smokeless tobacco: Never Used  Substance and Sexual Activity  . Alcohol  use: No    Alcohol/week: 0.0 oz  . Drug use: No  . Sexual activity: Yes    Birth control/protection: Surgical  Other Topics Concern  . Not on file  Social History Narrative   Lives with husband   Caffeine use:     Review of Systems: General: negative for chills, fever, night sweats or weight changes.  Cardiovascular: negative for chest pain, dyspnea on exertion, edema, orthopnea, palpitations, paroxysmal nocturnal dyspnea or shortness of breath Dermatological: negative for rash Respiratory: negative for cough or wheezing Urologic: negative for hematuria Abdominal: negative for nausea, vomiting, diarrhea, bright red blood per rectum, melena, or hematemesis Neurologic: negative for visual changes, syncope, or dizziness All other systems reviewed and are otherwise negative except as noted above.   Physical Exam:  Blood pressure 116/70, pulse 82, height 5\' 4"  (1.626 m), weight 211 lb 6.4 oz (95.9 kg), SpO2 94 %.  General appearance: alert, cooperative and no distress Neck: no carotid bruit and no JVD Lungs: clear to auscultation bilaterally Heart: regular rate and rhythm, S1, S2 normal, no murmur, click, rub or  gallop Extremities: extremities normal, atraumatic, no cyanosis or edema Pulses: 2+ and symmetric Skin: Skin color, texture, turgor normal. No rashes or lesions Neurologic: Grossly normal  EKG NSR. No ischemic abnormaltiies -- personally reviewed   ASSESSMENT AND PLAN:   1. HTN: controlled on current regimen and followed by PCP.   2. LVH w/ Diastolic Dysfunction: noted on echo in 2014. Moderate LVH w/ G1DD, in the setting of HTN. Volume is controlled. No dyspnea or edema. BP is well controlled.    3. Cardiac Assessment: Based on the Revised Cardiac Risk Index which takes into account the following listed below: her estimated risk of adverse outcome with non-cardiac surgery is: Very Low Risk. Estimated Rate of MI, PE, VF, Cardiac Arrest or complete heart block is 0.9%  High Risk Surgery: No CAD: no  CHF: No  Cerebrovascular Dz: yes (TIA) DM on Insulin: No SCr > 2: No    4. Lumbar Disc Disease: tentatively scheduled for surgery in March. Based on [facility] guidelines and cardiac risk assessment, pt is of acceptable risk for lumbar surgery. Given lack of ischemic symptoms and ability to complete at least 4 METs of physical activity w/o exertional CP and dyspnea, pt can undergo surgery w/o need for further cardiac testing. Pt is cleared from a cardiac standpoint. Based on diastolic dysfunction, recommend cautious use of IVFs during the perioperative period to avoid volume overload/ acute diastolic CHF. I will route clearance to Dr. Rolena Infante.   5. DLD: on statin therapy with Crestor. Lipid profile is followed by PCP.    Panfilo Ketchum Ladoris Gene, MHS [facility] 06/21/2017 1:17 PM

## 2017-06-21 NOTE — Telephone Encounter (Signed)
   Pt was seen in clinic today for preoperative cardiac evaluation.   3. Cardiac Assessment: Based on the Revised Cardiac Risk Index which takes into account the following listed below: her estimated risk of adverse outcome with non-cardiac surgery is: Very Low Risk. Estimated Rate of MI, PE, VF, Cardiac Arrest or complete heart block is 0.9%  High Risk Surgery: No CAD: no  CHF: No  Cerebrovascular Dz: Yes (TIA) DM on Insulin: No SCr > 2: No    4. Lumbar Disc Disease: tentatively scheduled for surgery in March. Based on [facility] guidelines and cardiac risk assessment, pt is of acceptable risk for lumbar surgery. Given lack of ischemic symptoms and ability to complete at least 4 METs of physical activity w/o exertional CP and dyspnea, pt can undergo surgery w/o need for further cardiac testing. Pt is cleared from a cardiac standpoint. Based on diastolic dysfunction, recommend cautious use of IVFs during the perioperative period to avoid volume overload/ acute diastolic CHF. I will route clearance to Dr. Rolena Infante.   Lyda Jester, PA-C 06/21/2017

## 2017-06-21 NOTE — Patient Instructions (Signed)
Medication Instructions:  Your physician recommends that you continue on your current medications as directed. Please refer to the Current Medication list given to you today.   Labwork: None Ordered   Testing/Procedures: None Ordered   Follow-Up: Your surgical clearance will be sent to your surgeon  Your physician recommends that you schedule a follow-up appointment in: as needed with Dr. [name]   If you need a refill on your cardiac medications before your next appointment, please call your pharmacy.   Thank you for choosing [facility]! Christen Bame, RN [phone]

## 2017-07-04 ENCOUNTER — Encounter (HOSPITAL_COMMUNITY): Payer: Self-pay

## 2017-07-04 NOTE — Pre-Procedure Instructions (Signed)
Robyn Aguilar  07/04/2017    Your procedure is scheduled on Thursday, July 13, 2017 at 7:30 AM.   Report to [facility] Entrance "A" Admitting Office at 5:30 AM.   Call this number if you have problems the morning of surgery: [phone]   Questions prior to day of surgery, please call [phone] between 8 & 4 PM.   Remember:  Do not eat food or drink liquids after midnight Wednesday, 07/12/17.  Take these medicines the morning of surgery with A SIP OF WATER: Metoprolol (Toprol XL), Rabeprazole (Aciphex), Meperidine (Demerol) - if needed, Tylenol - if needed.  Stop Aspirin ONLY if instructed by surgeon. Stop CoQ10 5 days prior to surgery. Do not use NSAIDS (Ibuprofen, Aleve, etc) 5 days prior to surgery.   How to Manage Your Diabetes Before Surgery   Why is it important to control my blood sugar before and after surgery?   Improving blood sugar levels before and after surgery helps healing and can limit problems.  A way of improving blood sugar control is eating a healthy diet by:  - Eating less sugar and carbohydrates  - Increasing activity/exercise  - Talk with your doctor about reaching your blood sugar goals  High blood sugars (greater than 180 mg/dL) can raise your risk of infections and slow down your recovery so you will need to focus on controlling your diabetes during the weeks before surgery.  Make sure that the doctor who takes care of your diabetes knows about your planned surgery including the date and location.  How do I manage my blood sugars before surgery?   Check your blood sugar at least 4 times a day, 2 days before surgery to make sure that they are not too high or low.  Check your blood sugar the morning of your surgery when you wake up and every 2 hours until you get to the Short-Stay unit.  Treat a low blood sugar (less than 70 mg/dL) with 1/2 cup of clear juice (cranberry or apple), 4 glucose tablets, OR glucose gel.  Recheck  blood sugar in 15 minutes after treatment (to make sure it is greater than 70 mg/dL).  If blood sugar is not greater than 70 mg/dL on re-check, call [phone] for further instructions.   Report your blood sugar to the Short-Stay nurse when you get to Short-Stay.  References:  [facility], 2007 "How to Manage your Diabetes Before and After Surgery".   Do not wear jewelry, make-up or nail polish.  Do not wear lotions, powders, perfumes or deodorant.  Do not shave 48 hours prior to surgery.   Do not bring valuables to the hospital.  [facility] is not responsible for any belongings or valuables.  Contacts, dentures or bridgework may not be worn into surgery.  Leave your suitcase in the car.  After surgery it may be brought to your room.  For patients admitted to the hospital, discharge time will be determined by your treatment team.  Patients discharged the day of surgery will not be allowed to drive home.   [place] - Preparing for Surgery  Before surgery, you can play an important role.  Because skin is not sterile, your skin needs to be as free of germs as possible.  You can reduce the number of germs on you skin by washing with CHG (chlorahexidine gluconate) soap before surgery.  CHG is an antiseptic cleaner which kills germs and bonds with the skin to continue killing germs  even after washing.  Please DO NOT use if you have an allergy to CHG or antibacterial soaps.  If your skin becomes reddened/irritated stop using the CHG and inform your nurse when you arrive at Short Stay.  Do not shave (including legs and underarms) for at least 48 hours prior to the first CHG shower.  You may shave your face.  Please follow these instructions carefully:   1.  Shower with CHG Soap the night before surgery and the                    morning of Surgery.  2.  If you choose to wash your hair, wash your hair first as usual with your       normal shampoo.  3.  After you  shampoo, rinse your hair and body thoroughly to remove the shampoo.  4.  Use CHG as you would any other liquid soap.  You can apply chg directly       to the skin and wash gently with scrungie or a clean washcloth.  5.  Apply the CHG Soap to your body ONLY FROM THE NECK DOWN.        Do not use on open wounds or open sores.  Avoid contact with your eyes, ears, mouth and genitals (private parts).  Wash genitals (private parts) with your normal soap.  6.  Wash thoroughly, paying special attention to the area where your surgery        will be performed.  7.  Thoroughly rinse your body with warm water from the neck down.  8.  DO NOT shower/wash with your normal soap after using and rinsing off       the CHG Soap.  9.  Pat yourself dry with a clean towel.            10.  Wear clean pajamas.            11.  Place clean sheets on your bed the night of your first shower and do not        sleep with pets.  Day of Surgery  Shower as above. Do not apply any lotions/deodorants the morning of surgery.  Please wear clean clothes to the hospital.   Please read over the fact sheets that you were given.

## 2017-07-05 ENCOUNTER — Other Ambulatory Visit (HOSPITAL_COMMUNITY): Payer: Self-pay | Admitting: *Deleted

## 2017-07-05 ENCOUNTER — Encounter (HOSPITAL_COMMUNITY)
Admission: RE | Admit: 2017-07-05 | Discharge: 2017-07-05 | Disposition: A | Discharge status: Home / Self Care | Payer: Medicare Other | Source: Ambulatory Visit | Attending: Orthopedic Surgery | Admitting: Orthopedic Surgery

## 2017-07-05 ENCOUNTER — Other Ambulatory Visit: Payer: Self-pay

## 2017-07-05 ENCOUNTER — Encounter (HOSPITAL_COMMUNITY): Payer: Self-pay

## 2017-07-05 DIAGNOSIS — Z91040 Latex allergy status: Secondary | ICD-10-CM | POA: Diagnosis not present

## 2017-07-05 DIAGNOSIS — K219 Gastro-esophageal reflux disease without esophagitis: Secondary | ICD-10-CM | POA: Diagnosis not present

## 2017-07-05 DIAGNOSIS — Z90722 Acquired absence of ovaries, bilateral: Secondary | ICD-10-CM | POA: Insufficient documentation

## 2017-07-05 DIAGNOSIS — Z86010 Personal history of colonic polyps: Secondary | ICD-10-CM | POA: Insufficient documentation

## 2017-07-05 DIAGNOSIS — I10 Essential (primary) hypertension: Secondary | ICD-10-CM | POA: Diagnosis not present

## 2017-07-05 DIAGNOSIS — M47816 Spondylosis without myelopathy or radiculopathy, lumbar region: Secondary | ICD-10-CM | POA: Diagnosis not present

## 2017-07-05 DIAGNOSIS — Z8673 Personal history of transient ischemic attack (TIA), and cerebral infarction without residual deficits: Secondary | ICD-10-CM | POA: Insufficient documentation

## 2017-07-05 DIAGNOSIS — Z800 Family history of malignant neoplasm of digestive organs: Secondary | ICD-10-CM | POA: Diagnosis not present

## 2017-07-05 DIAGNOSIS — E7800 Pure hypercholesterolemia, unspecified: Secondary | ICD-10-CM | POA: Insufficient documentation

## 2017-07-05 DIAGNOSIS — G4730 Sleep apnea, unspecified: Secondary | ICD-10-CM | POA: Diagnosis not present

## 2017-07-05 DIAGNOSIS — E119 Type 2 diabetes mellitus without complications: Secondary | ICD-10-CM | POA: Insufficient documentation

## 2017-07-05 DIAGNOSIS — Z888 Allergy status to other drugs, medicaments and biological substances status: Secondary | ICD-10-CM | POA: Diagnosis not present

## 2017-07-05 DIAGNOSIS — Z8042 Family history of malignant neoplasm of prostate: Secondary | ICD-10-CM | POA: Diagnosis not present

## 2017-07-05 DIAGNOSIS — Z01812 Encounter for preprocedural laboratory examination: Secondary | ICD-10-CM | POA: Diagnosis not present

## 2017-07-05 DIAGNOSIS — Z885 Allergy status to narcotic agent status: Secondary | ICD-10-CM | POA: Diagnosis not present

## 2017-07-05 DIAGNOSIS — Z8249 Family history of ischemic heart disease and other diseases of the circulatory system: Secondary | ICD-10-CM | POA: Insufficient documentation

## 2017-07-05 DIAGNOSIS — Z96651 Presence of right artificial knee joint: Secondary | ICD-10-CM | POA: Insufficient documentation

## 2017-07-05 DIAGNOSIS — M5126 Other intervertebral disc displacement, lumbar region: Secondary | ICD-10-CM | POA: Insufficient documentation

## 2017-07-05 DIAGNOSIS — Z90710 Acquired absence of both cervix and uterus: Secondary | ICD-10-CM | POA: Insufficient documentation

## 2017-07-05 HISTORY — DX: Heart failure, unspecified: I50.9

## 2017-07-05 LAB — BASIC METABOLIC PANEL
Anion gap: 9 (ref 5–15)
BUN: 15 mg/dL (ref 6–20)
CO2: 28 mmol/L (ref 22–32)
Calcium: 9.9 mg/dL (ref 8.9–10.3)
Chloride: 101 mmol/L (ref 101–111)
Creatinine, Ser: 0.97 mg/dL (ref 0.44–1.00)
GFR calc Af Amer: >60 mL/min (ref >60)
GFR calc non Af Amer: 59 mL/min — ABNORMAL LOW (ref >60)
Glucose, Bld: 131 mg/dL — ABNORMAL HIGH (ref 65–99)
Potassium: 3.4 mmol/L — ABNORMAL LOW (ref 3.5–5.1)
Sodium: 138 mmol/L (ref 135–145)

## 2017-07-05 LAB — CBC
HCT: 41.2 % (ref 36.0–46.0)
Hemoglobin: 13.4 g/dL (ref 12.0–15.0)
MCH: 27.6 pg (ref 26.0–34.0)
MCHC: 32.5 g/dL (ref 30.0–36.0)
MCV: 84.9 fL (ref 78.0–100.0)
Platelets: 239 10*3/uL (ref 150–400)
RBC: 4.85 MIL/uL (ref 3.87–5.11)
RDW: 16 % — ABNORMAL HIGH (ref 11.5–15.5)
WBC: 3.9 10*3/uL — ABNORMAL LOW (ref 4.0–10.5)

## 2017-07-05 LAB — HEMOGLOBIN A1C
Hgb A1c MFr Bld: 6.3 % — ABNORMAL HIGH (ref 4.8–5.6)
Mean Plasma Glucose: 134.11 mg/dL

## 2017-07-05 LAB — GLUCOSE, CAPILLARY: Glucose-Capillary: 125 mg/dL — ABNORMAL HIGH (ref 65–99)

## 2017-07-05 LAB — SURGICAL PCR SCREEN
MRSA, PCR: NEGATIVE
Staphylococcus aureus: NEGATIVE

## 2017-07-05 NOTE — Progress Notes (Signed)
Pt states the only "heart" history she has is CHF after her knee replacement surgery in 2014. States she has not had any problems since. Her cardiologist is Dr. Daneen Schick. Cardiac clearance in Epic on 06/21/17. Pt is type 2 diabetic. States her fasting blood sugar is usually in the 100's. Pt is not on medication for diabetes. A1C today was 6.3.

## 2017-07-07 NOTE — H&P (Addendum)
Patient ID: Robyn Aguilar MRN: [id number] DOB/AGE: 1950-07-17 67 y.o.  Admit date: (Not on file)  Admission Diagnoses:  L2-3 Disc HNP  HPI: The patient is here today for a pre-operative History and Physical. They are scheduled for right L2-3 discectomy   on 07-13-17 with Dr. Rolena Infante at [facility].  Pt has been cleared for surgical intervention.     Past Medical History: Past Medical History:  Diagnosis Date  . Allergic rhinitis   . Arthritis    knees and neck  . Cataracts, bilateral    immature   . CHF (congestive heart failure) ([place]) 2014   after surgery  . Complication of anesthesia    had heart failure after knee surgery 02/04/13  . Diabetes mellitus without complication ([place])   . Dizziness    takes Antivert prn  . GERD (gastroesophageal reflux disease)    takes Aciphex prn  . High cholesterol    but doesn't take any meds  . History of colon polyps   . History of migraine    last one 6-76months ago;takes Topamax nightly  . Hypertension    takes Metoprolol and maxzide daily  . Joint pain   . Joint swelling   . Migraine   . Migraines   . Pneumonia    at age 62  . Seizures ([place])    hx of;last one 53yrs ago;no meds required;states it was brought on by med;only one ever had  . Shortness of breath 02/06/2013  . Sleep apnea    sleep study in epic from 2014-uses CPAP  . Stroke [facility])    2 TIA  . TIA (transient ischemic attack)     Surgical History: Past Surgical History:  Procedure Laterality Date  . ABDOMINAL HYSTERECTOMY  1991  . carpel tunnel Right 1988  . COLONOSCOPY    . disc surgery  1996  . ESOPHAGOGASTRODUODENOSCOPY    . EYE SURGERY Bilateral    cataract surgery with lens implants  . HERNIA REPAIR  1987   x 2  . KNEE SURGERY Left 2011   replacement-partial  . ROBOTIC ASSISTED BILATERAL SALPINGO OOPHERECTOMY Bilateral 10/22/2013   Procedure: ROBOTIC ASSISTED BILATERAL SALPINGO OOPHORECTOMY;  Surgeon: Imagene Gurney A. Alycia Rossetti, MD;   Location: [facility];  Service: Gynecology;  Laterality: Bilateral;  . ROTATOR CUFF REPAIR Right   . TOTAL KNEE ARTHROPLASTY Right   . TOTAL KNEE ARTHROPLASTY Right 02/04/2013   Procedure: RIGHT TOTAL KNEE ARTHROPLASTY;  Surgeon: Vickey Huger, MD;  Location: [place];  Service: Orthopedics;  Laterality: Right;    Family History: Family History  Problem Relation Age of Onset  . Hypertension Mother   . Pancreatic cancer Mother   . Prostate cancer Other     Social History: Social History   Socioeconomic History  . Marital status: Married    Spouse name: Not on file  . Number of children: 2  . Years of education: college  . Highest education level: Not on file  Social Needs  . Financial resource strain: Not on file  . Food insecurity - worry: Not on file  . Food insecurity - inability: Not on file  . Transportation needs - medical: Not on file  . Transportation needs - non-medical: Not on file  Occupational History    Employer: [employer]  Tobacco Use  . Smoking status: Never Smoker  . Smokeless tobacco: Never Used  Substance and Sexual Activity  . Alcohol use: No    Alcohol/week: 0.0 oz  . Drug  use: No  . Sexual activity: Yes    Birth control/protection: Surgical  Other Topics Concern  . Not on file  Social History Narrative   Lives with husband   Caffeine use:    Allergies: Codeine; Adhesive [tape]; Statins; and Latex  Medications: I have reviewed the patient's current medications.  Vital Signs: No data found.  Radiology: US Renal  Result Date: 06/19/2017 CLINICAL DATA:  Abnormal MRI suggesting left renal cyst. EXAM: RENAL / URINARY TRACT ULTRASOUND COMPLETE COMPARISON:  No comparison study available, including no access to any prior MRI. FINDINGS: Right Kidney: Length: 10.3 cm. Echogenicity within normal limits. No mass or hydronephrosis visualized. Left Kidney: Length: 10.3 cm. Echogenicity is normal. 1.6 x 1.5 x 1.4 cm simple cyst of the lower pole. No  solid mass. No hydronephrosis or stone. Bladder: Appears normal for degree of bladder distention. IMPRESSION: Approximately 1.5 cm in diameter simple cyst at the lower pole of the left kidney. No other abnormal finding. Electronically Signed   By: Nelson Chimes M.D.   On: 06/19/2017 13:56    Labs: Recent Labs    07/05/17 1059  WBC 3.9*  RBC 4.85  HCT 41.2  PLT 239   Recent Labs    07/05/17 1059  NA 138  K 3.4*  CL 101  CO2 28  BUN 15  CREATININE 0.97  GLUCOSE 131*  CALCIUM 9.9   No results for input(s): LABPT, INR in the last 72 hours.  Review of Systems: ROS  Physical Exam: There is no height or weight on file to calculate BMI.  Physical Exam  Constitutional: She is oriented to person, place, and time. She appears well-developed and well-nourished.  HENT:  Head: Normocephalic.  Eyes: Pupils are equal, round, and reactive to light.  Neck: Normal range of motion.  Cardiovascular: Normal rate, regular rhythm and normal heart sounds.  Respiratory: Effort normal and breath sounds normal.  GI: Soft. Bowel sounds are normal.  Neurological: She is alert and oriented to person, place, and time.  Skin: Skin is warm and dry.  Psychiatric: She has a normal mood and affect. Her behavior is normal. Judgment and thought content normal.   No significant hip knee or ankle pain except for moderate right knee pain.  Intact peripheral pulses in the lower extremity, compartments are soft and nontender.  Positive numbness and dysesthesias in the L2 dermatome.  Right sided lumbar tenderness to palpation.  ROM increases pain.  Motor deficits noted in the L:@ dermatone.   Assessment and Plan: Lumbar MRI: completed on May 15, 2017 was reviewed with the patient.  I have also reviewed the radiology report.  Patient has a large right L2-3 foraminal disc herniation causing marked compression of the exiting L2 nerve root.  Mild degenerative changes at the remainder of the levels.  There is also  note of a simple cyst in the left kidney.  Risks and benefits of surgery were discussed with the patient. These include: Infection, bleeding, death, stroke, paralysis, ongoing or worse pain, need for additional surgery, leak of spinal fluid, adjacent segment degeneration requiring additional surgery, post-operative hematoma formation that can result in neurological compromise and the need for urgent/emergent re-operation. Loss in bowel and bladder control. Injury to major vessels that could result in the need for urgent abdominal surgery to stop bleeding. Risk of deep venous thrombosis (DVT) and the need for additional treatment. Recurrent disc herniation resulting in the need for revision surgery, which could include fusion surgery (utilizing instrumentation such as  pedicle screws and intervertebral cages).  Additional risk: If instrumentation is used to address spinal stenosis there is a risk of migration, or breakage of that hardware that could require additional surgery.  Goal of surgery: Reduce (not eliminate) pain, and improve quality of life.   Ronette Deter, PAC for Melina Schools, MD [place] [phone]  Patient continues to have severe right radicular L2 and to a lesser degree L3 radicular pain secondary to a large posterior lateral to the right L2-3 disc herniation.  We have again gone over the risks and benefits of surgery and all of her and her husband's questions have been addressed.  Plan on moving forward with a lumbar discectomy decompression.

## 2017-07-12 MED ORDER — CEFAZOLIN SODIUM-DEXTROSE 2-4 GM/100ML-% IV SOLN
2.0000 g | INTRAVENOUS | Status: AC
  Administered 2017-07-13: 2 g via INTRAVENOUS
  Filled 2017-07-12: qty 100

## 2017-07-12 NOTE — Anesthesia Preprocedure Evaluation (Addendum)
Anesthesia Evaluation  Patient identified by MRN, date of birth, ID band Patient awake    Reviewed: Allergy & Precautions, NPO status , Patient's Chart, lab work & pertinent test results  Airway Mallampati: II  TM Distance: >3 FB Neck ROM: Full    Dental  (+) Dental Advisory Given   Pulmonary sleep apnea and Continuous Positive Airway Pressure Ventilation ,    breath sounds clear to auscultation       Cardiovascular hypertension, Pt. on medications and Pt. on home beta blockers +CHF   Rhythm:Regular Rate:Normal  Echocardiogram 02/07/13 Study Conclusions  - Left ventricle: The cavity size was normal. There was moderate concentric hypertrophy. Systolic function was vigorous. The estimated ejection fraction was in the range of 65% to 70%. Wall motion was normal; there were no regional wall motion abnormalities. Doppler parameters are consistent with abnormal left ventricular relaxation (grade 1 diastolic dysfunction). - Mitral valve: Mild regurgitation. Valve area by pressure half-time: 1.56cm^2. - Pericardium, extracardiac: A small pericardial effusion was identified.     Neuro/Psych Seizures -,  TIA   GI/Hepatic Neg liver ROS, GERD  ,  Endo/Other  diabetes  Renal/GU negative Renal ROS     Musculoskeletal  (+) Arthritis ,   Abdominal   Peds  Hematology negative hematology ROS (+)   Anesthesia Other Findings   Reproductive/Obstetrics                            Lab Results  Component Value Date   WBC 3.9 (L) 07/05/2017   HGB 13.4 07/05/2017   HCT 41.2 07/05/2017   MCV 84.9 07/05/2017   PLT 239 07/05/2017   Lab Results  Component Value Date   CREATININE 0.97 07/05/2017   BUN 15 07/05/2017   NA 138 07/05/2017   K 3.4 (L) 07/05/2017   CL 101 07/05/2017   CO2 28 07/05/2017    Anesthesia Physical Anesthesia Plan  ASA: II  Anesthesia Plan: General   Post-op  Pain Management:    Induction: Intravenous  PONV Risk Score and Plan: 3 and Dexamethasone, Ondansetron and Treatment may vary due to age or medical condition  Airway Management Planned: Oral ETT  Additional Equipment:   Intra-op Plan:   Post-operative Plan: Extubation in OR  Informed Consent: I have reviewed the patients History and Physical, chart, labs and discussed the procedure including the risks, benefits and alternatives for the proposed anesthesia with the patient or authorized representative who has indicated his/her understanding and acceptance.   Dental advisory given  Plan Discussed with: CRNA  Anesthesia Plan Comments:        Anesthesia Quick Evaluation

## 2017-07-13 ENCOUNTER — Ambulatory Visit (HOSPITAL_COMMUNITY): Payer: Medicare Other | Admitting: Anesthesiology

## 2017-07-13 ENCOUNTER — Encounter (HOSPITAL_COMMUNITY): Payer: Self-pay

## 2017-07-13 ENCOUNTER — Observation Stay (HOSPITAL_COMMUNITY)
Admission: RE | Admit: 2017-07-13 | Discharge: 2017-07-14 | Disposition: A | Discharge status: Home / Self Care | Payer: Medicare Other | Source: Ambulatory Visit | Attending: Orthopedic Surgery | Admitting: Orthopedic Surgery

## 2017-07-13 ENCOUNTER — Other Ambulatory Visit: Payer: Self-pay

## 2017-07-13 ENCOUNTER — Ambulatory Visit (HOSPITAL_COMMUNITY): Payer: Medicare Other

## 2017-07-13 ENCOUNTER — Encounter (HOSPITAL_COMMUNITY)
Admission: RE | Disposition: A | Discharge status: Home / Self Care | Payer: Self-pay | Source: Ambulatory Visit | Attending: Orthopedic Surgery

## 2017-07-13 DIAGNOSIS — I110 Hypertensive heart disease with heart failure: Secondary | ICD-10-CM | POA: Diagnosis not present

## 2017-07-13 DIAGNOSIS — M5116 Intervertebral disc disorders with radiculopathy, lumbar region: Principal | ICD-10-CM | POA: Insufficient documentation

## 2017-07-13 DIAGNOSIS — I509 Heart failure, unspecified: Secondary | ICD-10-CM | POA: Diagnosis not present

## 2017-07-13 DIAGNOSIS — Z8673 Personal history of transient ischemic attack (TIA), and cerebral infarction without residual deficits: Secondary | ICD-10-CM | POA: Insufficient documentation

## 2017-07-13 DIAGNOSIS — M5126 Other intervertebral disc displacement, lumbar region: Secondary | ICD-10-CM | POA: Diagnosis present

## 2017-07-13 DIAGNOSIS — G4730 Sleep apnea, unspecified: Secondary | ICD-10-CM | POA: Diagnosis not present

## 2017-07-13 DIAGNOSIS — E7800 Pure hypercholesterolemia, unspecified: Secondary | ICD-10-CM | POA: Insufficient documentation

## 2017-07-13 DIAGNOSIS — E119 Type 2 diabetes mellitus without complications: Secondary | ICD-10-CM | POA: Diagnosis not present

## 2017-07-13 DIAGNOSIS — R2689 Other abnormalities of gait and mobility: Secondary | ICD-10-CM | POA: Insufficient documentation

## 2017-07-13 DIAGNOSIS — Z888 Allergy status to other drugs, medicaments and biological substances status: Secondary | ICD-10-CM | POA: Diagnosis not present

## 2017-07-13 DIAGNOSIS — Z419 Encounter for procedure for purposes other than remedying health state, unspecified: Secondary | ICD-10-CM

## 2017-07-13 DIAGNOSIS — N281 Cyst of kidney, acquired: Secondary | ICD-10-CM | POA: Insufficient documentation

## 2017-07-13 DIAGNOSIS — Z885 Allergy status to narcotic agent status: Secondary | ICD-10-CM | POA: Insufficient documentation

## 2017-07-13 DIAGNOSIS — Z7982 Long term (current) use of aspirin: Secondary | ICD-10-CM | POA: Diagnosis not present

## 2017-07-13 DIAGNOSIS — Z79899 Other long term (current) drug therapy: Secondary | ICD-10-CM | POA: Diagnosis not present

## 2017-07-13 DIAGNOSIS — Z9889 Other specified postprocedural states: Secondary | ICD-10-CM

## 2017-07-13 DIAGNOSIS — K219 Gastro-esophageal reflux disease without esophagitis: Secondary | ICD-10-CM | POA: Insufficient documentation

## 2017-07-13 HISTORY — DX: Other intervertebral disc degeneration, lumbar region: M51.36

## 2017-07-13 HISTORY — PX: LUMBAR LAMINECTOMY/DECOMPRESSION MICRODISCECTOMY: SHX5026

## 2017-07-13 HISTORY — DX: Other intervertebral disc degeneration, lumbar region without mention of lumbar back pain or lower extremity pain: M51.369

## 2017-07-13 LAB — GLUCOSE, CAPILLARY
Glucose-Capillary: 113 mg/dL — ABNORMAL HIGH (ref 65–99)
Glucose-Capillary: 141 mg/dL — ABNORMAL HIGH (ref 65–99)
Glucose-Capillary: 163 mg/dL — ABNORMAL HIGH (ref 65–99)
Glucose-Capillary: 158 mg/dL — ABNORMAL HIGH (ref 65–99)
Glucose-Capillary: 158 mg/dL — ABNORMAL HIGH (ref 65–99)

## 2017-07-13 SURGERY — LUMBAR LAMINECTOMY/DECOMPRESSION MICRODISCECTOMY 1 LEVEL
Anesthesia: General | Laterality: Right

## 2017-07-13 MED ORDER — METHOCARBAMOL 1000 MG/10ML IJ SOLN
500.0000 mg | Freq: Four times a day (QID) | INTRAVENOUS | Status: DC | PRN
  Filled 2017-07-13: qty 5

## 2017-07-13 MED ORDER — METHOCARBAMOL 500 MG PO TABS
ORAL_TABLET | ORAL | Status: AC
  Filled 2017-07-13: qty 1

## 2017-07-13 MED ORDER — LACTATED RINGERS IV SOLN: INTRAVENOUS | Status: DC

## 2017-07-13 MED ORDER — OXYCODONE HCL 5 MG PO TABS
10.0000 mg | ORAL_TABLET | ORAL | Status: DC | PRN
  Administered 2017-07-13 – 2017-07-14 (×7): 10 mg via ORAL
  Filled 2017-07-13 (×6): qty 2

## 2017-07-13 MED ORDER — METHOCARBAMOL 500 MG PO TABS: 500.0000 mg | ORAL_TABLET | Freq: Three times a day (TID) | ORAL | 0 refills | Status: DC

## 2017-07-13 MED ORDER — OXYCODONE HCL 5 MG PO TABS
ORAL_TABLET | ORAL | Status: AC
  Filled 2017-07-13: qty 2

## 2017-07-13 MED ORDER — MORPHINE SULFATE (PF) 4 MG/ML IV SOLN: 2.0000 mg | INTRAVENOUS | Status: DC | PRN

## 2017-07-13 MED ORDER — PHENYLEPHRINE 40 MCG/ML (10ML) SYRINGE FOR IV PUSH (FOR BLOOD PRESSURE SUPPORT)
PREFILLED_SYRINGE | INTRAVENOUS | Status: AC
  Filled 2017-07-13: qty 10

## 2017-07-13 MED ORDER — FENTANYL CITRATE (PF) 100 MCG/2ML IJ SOLN
INTRAMUSCULAR | Status: AC
  Administered 2017-07-13: 50 ug via INTRAVENOUS
  Filled 2017-07-13: qty 2

## 2017-07-13 MED ORDER — FENTANYL CITRATE (PF) 100 MCG/2ML IJ SOLN
25.0000 ug | INTRAMUSCULAR | Status: DC | PRN
  Administered 2017-07-13: 25 ug via INTRAVENOUS
  Administered 2017-07-13: 50 ug via INTRAVENOUS
  Administered 2017-07-13: 25 ug via INTRAVENOUS

## 2017-07-13 MED ORDER — THROMBIN (RECOMBINANT) 20000 UNITS EX SOLR
CUTANEOUS | Status: DC | PRN
  Administered 2017-07-13: 09:00:00 via TOPICAL

## 2017-07-13 MED ORDER — EPHEDRINE SULFATE 50 MG/ML IJ SOLN
INTRAMUSCULAR | Status: DC | PRN
  Administered 2017-07-13 (×2): 5 mg via INTRAVENOUS

## 2017-07-13 MED ORDER — METHYLPREDNISOLONE ACETATE 40 MG/ML IJ SUSP
INTRAMUSCULAR | Status: DC | PRN
  Administered 2017-07-13: 40 mg

## 2017-07-13 MED ORDER — BUPIVACAINE-EPINEPHRINE 0.25% -1:200000 IJ SOLN
INTRAMUSCULAR | Status: DC | PRN
  Administered 2017-07-13: 10 mL

## 2017-07-13 MED ORDER — ONDANSETRON HCL 4 MG/2ML IJ SOLN
4.0000 mg | Freq: Once | INTRAMUSCULAR | Status: DC | PRN
Start: 2017-07-13 — End: 2017-07-13

## 2017-07-13 MED ORDER — DOCUSATE SODIUM 100 MG PO CAPS
100.0000 mg | ORAL_CAPSULE | Freq: Two times a day (BID) | ORAL | Status: DC
  Administered 2017-07-13 – 2017-07-14 (×2): 100 mg via ORAL
  Filled 2017-07-13 (×2): qty 1

## 2017-07-13 MED ORDER — LACTATED RINGERS IV SOLN
INTRAVENOUS | Status: DC | PRN
  Administered 2017-07-13: 07:00:00 via INTRAVENOUS

## 2017-07-13 MED ORDER — POLYETHYLENE GLYCOL 3350 17 G PO PACK: 17.0000 g | PACK | Freq: Every day | ORAL | Status: DC | PRN

## 2017-07-13 MED ORDER — CEFAZOLIN SODIUM-DEXTROSE 2-4 GM/100ML-% IV SOLN
2.0000 g | Freq: Three times a day (TID) | INTRAVENOUS | Status: AC
Start: 2017-07-13 — End: 2017-07-14
  Administered 2017-07-13 – 2017-07-14 (×2): 2 g via INTRAVENOUS
  Filled 2017-07-13 (×2): qty 100

## 2017-07-13 MED ORDER — MIDAZOLAM HCL 2 MG/2ML IJ SOLN
INTRAMUSCULAR | Status: AC
  Filled 2017-07-13: qty 2

## 2017-07-13 MED ORDER — LIDOCAINE HCL (CARDIAC) 20 MG/ML IV SOLN
INTRAVENOUS | Status: DC | PRN
  Administered 2017-07-13: 80 mg via INTRAVENOUS

## 2017-07-13 MED ORDER — PROPOFOL 10 MG/ML IV BOLUS
INTRAVENOUS | Status: AC
  Filled 2017-07-13: qty 20

## 2017-07-13 MED ORDER — PROPOFOL 10 MG/ML IV BOLUS
INTRAVENOUS | Status: DC | PRN
  Administered 2017-07-13: 200 mg via INTRAVENOUS

## 2017-07-13 MED ORDER — PANTOPRAZOLE SODIUM 40 MG PO TBEC
40.0000 mg | DELAYED_RELEASE_TABLET | Freq: Every day | ORAL | Status: DC
  Filled 2017-07-13: qty 1

## 2017-07-13 MED ORDER — LORATADINE 10 MG PO TABS
10.0000 mg | ORAL_TABLET | Freq: Every day | ORAL | Status: DC
  Administered 2017-07-13: 10 mg via ORAL
  Filled 2017-07-13 (×2): qty 1

## 2017-07-13 MED ORDER — DEXAMETHASONE SODIUM PHOSPHATE 4 MG/ML IJ SOLN
INTRAMUSCULAR | Status: DC | PRN
  Administered 2017-07-13: 10 mg via INTRAVENOUS

## 2017-07-13 MED ORDER — ONDANSETRON HCL 4 MG/2ML IJ SOLN
4.0000 mg | Freq: Four times a day (QID) | INTRAMUSCULAR | Status: DC | PRN
  Administered 2017-07-13 – 2017-07-14 (×3): 4 mg via INTRAVENOUS
  Filled 2017-07-13 (×3): qty 2

## 2017-07-13 MED ORDER — SODIUM CHLORIDE 0.9% FLUSH: 3.0000 mL | INTRAVENOUS | Status: DC | PRN

## 2017-07-13 MED ORDER — ONDANSETRON HCL 4 MG PO TABS
4.0000 mg | ORAL_TABLET | Freq: Four times a day (QID) | ORAL | Status: DC | PRN
  Administered 2017-07-14: 4 mg via ORAL
  Filled 2017-07-13: qty 1

## 2017-07-13 MED ORDER — THROMBIN (RECOMBINANT) 20000 UNITS EX SOLR
CUTANEOUS | Status: AC
  Filled 2017-07-13: qty 20000

## 2017-07-13 MED ORDER — ONDANSETRON HCL 4 MG/2ML IJ SOLN
INTRAMUSCULAR | Status: DC | PRN
  Administered 2017-07-13: 4 mg via INTRAVENOUS

## 2017-07-13 MED ORDER — METHYLPREDNISOLONE ACETATE 40 MG/ML IJ SUSP
INTRAMUSCULAR | Status: AC
  Filled 2017-07-13: qty 1

## 2017-07-13 MED ORDER — MAGNESIUM CITRATE PO SOLN
1.0000 | Freq: Once | ORAL | Status: AC | PRN
  Administered 2017-07-13: 1 via ORAL
  Filled 2017-07-13: qty 296

## 2017-07-13 MED ORDER — 0.9 % SODIUM CHLORIDE (POUR BTL) OPTIME
TOPICAL | Status: DC | PRN
  Administered 2017-07-13 (×2): 1000 mL

## 2017-07-13 MED ORDER — FENTANYL CITRATE (PF) 250 MCG/5ML IJ SOLN
INTRAMUSCULAR | Status: DC | PRN
  Administered 2017-07-13 (×2): 25 ug via INTRAVENOUS
  Administered 2017-07-13: 100 ug via INTRAVENOUS
  Administered 2017-07-13: 50 ug via INTRAVENOUS
  Administered 2017-07-13 (×2): 25 ug via INTRAVENOUS

## 2017-07-13 MED ORDER — ACETAMINOPHEN 650 MG RE SUPP: 650.0000 mg | RECTAL | Status: DC | PRN

## 2017-07-13 MED ORDER — ROCURONIUM BROMIDE 100 MG/10ML IV SOLN
INTRAVENOUS | Status: DC | PRN
  Administered 2017-07-13: 5 mg via INTRAVENOUS
  Administered 2017-07-13: 50 mg via INTRAVENOUS
  Administered 2017-07-13: 20 mg via INTRAVENOUS

## 2017-07-13 MED ORDER — SODIUM CHLORIDE 0.9 % IV SOLN: 250.0000 mL | INTRAVENOUS | Status: DC

## 2017-07-13 MED ORDER — LIDOCAINE HCL (CARDIAC) 20 MG/ML IV SOLN
INTRAVENOUS | Status: AC
  Filled 2017-07-13: qty 5

## 2017-07-13 MED ORDER — ONDANSETRON 4 MG PO TBDP: 4.0000 mg | ORAL_TABLET | Freq: Three times a day (TID) | ORAL | 0 refills | Status: DC | PRN

## 2017-07-13 MED ORDER — PHENOL 1.4 % MT LIQD
1.0000 | OROMUCOSAL | Status: DC | PRN
Start: 2017-07-13 — End: 2017-07-14

## 2017-07-13 MED ORDER — MIDAZOLAM HCL 2 MG/2ML IJ SOLN
INTRAMUSCULAR | Status: DC | PRN
  Administered 2017-07-13: 2 mg via INTRAVENOUS

## 2017-07-13 MED ORDER — ACETAMINOPHEN 325 MG PO TABS: 650.0000 mg | ORAL_TABLET | ORAL | Status: DC | PRN

## 2017-07-13 MED ORDER — ROCURONIUM BROMIDE 10 MG/ML (PF) SYRINGE
PREFILLED_SYRINGE | INTRAVENOUS | Status: AC
  Filled 2017-07-13: qty 5

## 2017-07-13 MED ORDER — SUGAMMADEX SODIUM 200 MG/2ML IV SOLN
INTRAVENOUS | Status: AC
  Filled 2017-07-13: qty 2

## 2017-07-13 MED ORDER — METHOCARBAMOL 500 MG PO TABS
500.0000 mg | ORAL_TABLET | Freq: Four times a day (QID) | ORAL | Status: DC | PRN
  Administered 2017-07-13 – 2017-07-14 (×4): 500 mg via ORAL
  Filled 2017-07-13 (×3): qty 1

## 2017-07-13 MED ORDER — THROMBIN 20000 UNITS EX KIT
PACK | CUTANEOUS | Status: AC
  Filled 2017-07-13: qty 1

## 2017-07-13 MED ORDER — OXYCODONE-ACETAMINOPHEN 10-325 MG PO TABS: 1.0000 | ORAL_TABLET | ORAL | 0 refills | Status: DC | PRN

## 2017-07-13 MED ORDER — PHENYLEPHRINE HCL 10 MG/ML IJ SOLN
INTRAMUSCULAR | Status: DC | PRN
  Administered 2017-07-13 (×5): 80 ug via INTRAVENOUS

## 2017-07-13 MED ORDER — SUGAMMADEX SODIUM 200 MG/2ML IV SOLN
INTRAVENOUS | Status: DC | PRN
  Administered 2017-07-13: 200 mg via INTRAVENOUS

## 2017-07-13 MED ORDER — BUPIVACAINE-EPINEPHRINE (PF) 0.25% -1:200000 IJ SOLN
INTRAMUSCULAR | Status: AC
  Filled 2017-07-13: qty 30

## 2017-07-13 MED ORDER — MENTHOL 3 MG MT LOZG: 1.0000 | LOZENGE | OROMUCOSAL | Status: DC | PRN

## 2017-07-13 MED ORDER — ONDANSETRON HCL 4 MG/2ML IJ SOLN
INTRAMUSCULAR | Status: AC
  Filled 2017-07-13: qty 2

## 2017-07-13 MED ORDER — FENTANYL CITRATE (PF) 250 MCG/5ML IJ SOLN
INTRAMUSCULAR | Status: AC
  Filled 2017-07-13: qty 5

## 2017-07-13 MED ORDER — EPHEDRINE 5 MG/ML INJ
INTRAVENOUS | Status: AC
  Filled 2017-07-13: qty 10

## 2017-07-13 MED ORDER — POLYETHYL GLYCOL-PROPYL GLYCOL 0.4-0.3 % OP GEL
Freq: Every day | OPHTHALMIC | Status: DC | PRN
  Filled 2017-07-13: qty 10

## 2017-07-13 MED ORDER — DEXAMETHASONE SODIUM PHOSPHATE 10 MG/ML IJ SOLN
INTRAMUSCULAR | Status: AC
  Filled 2017-07-13: qty 1

## 2017-07-13 MED ORDER — FLUTICASONE PROPIONATE 50 MCG/ACT NA SUSP
1.0000 | Freq: Every evening | NASAL | Status: DC | PRN
  Filled 2017-07-13: qty 16

## 2017-07-13 MED ORDER — TRIAMTERENE-HCTZ 75-50 MG PO TABS
1.0000 | ORAL_TABLET | Freq: Every day | ORAL | Status: DC
  Administered 2017-07-13 – 2017-07-14 (×2): 1 via ORAL
  Filled 2017-07-13 (×2): qty 1

## 2017-07-13 MED ORDER — METOPROLOL SUCCINATE ER 50 MG PO TB24
50.0000 mg | ORAL_TABLET | Freq: Every day | ORAL | Status: DC
  Administered 2017-07-14: 50 mg via ORAL
  Filled 2017-07-13: qty 1

## 2017-07-13 MED ORDER — SODIUM CHLORIDE 0.9% FLUSH
3.0000 mL | Freq: Two times a day (BID) | INTRAVENOUS | Status: DC
  Administered 2017-07-14 (×2): 3 mL via INTRAVENOUS

## 2017-07-13 MED ORDER — OXYCODONE HCL 5 MG PO TABS: 5.0000 mg | ORAL_TABLET | ORAL | Status: DC | PRN

## 2017-07-13 SURGICAL SUPPLY — 60 items
BNDG COHESIVE 3X5 TAN STRL LF (GAUZE/BANDAGES/DRESSINGS) ×2 IMPLANT
BONE VIVIGEN FORMABLE 1.3CC (Bone Implant) ×2 IMPLANT
BUR EGG ELITE 4.0 (BURR) ×2 IMPLANT
BUR MATCHSTICK NEURO 3.0 LAGG (BURR) IMPLANT
CANISTER SUCT 3000ML PPV (MISCELLANEOUS) ×2 IMPLANT
CLSR STERI-STRIP ANTIMIC 1/2X4 (GAUZE/BANDAGES/DRESSINGS) ×2 IMPLANT
CORD BI POLAR (MISCELLANEOUS) ×2 IMPLANT
COVER SURGICAL LIGHT HANDLE (MISCELLANEOUS) ×2 IMPLANT
DRAIN CHANNEL 15F RND FF W/TCR (WOUND CARE) IMPLANT
DRAPE POUCH INSTRU U-SHP 10X18 (DRAPES) ×2 IMPLANT
DRAPE SURG 17X23 STRL (DRAPES) ×2 IMPLANT
DRAPE U-SHAPE 47X51 STRL (DRAPES) ×2 IMPLANT
DRSG OPSITE POSTOP 3X4 (GAUZE/BANDAGES/DRESSINGS) ×2 IMPLANT
DURAPREP 26ML APPLICATOR (WOUND CARE) ×2 IMPLANT
ELECT BLADE 4.0 EZ CLEAN MEGAD (MISCELLANEOUS)
ELECT CAUTERY BLADE 6.4 (BLADE) ×2 IMPLANT
ELECT PENCIL ROCKER SW 15FT (MISCELLANEOUS) ×2 IMPLANT
ELECT REM PT RETURN 9FT ADLT (ELECTROSURGICAL) ×2
ELECTRODE BLDE 4.0 EZ CLN MEGD (MISCELLANEOUS) IMPLANT
ELECTRODE REM PT RTRN 9FT ADLT (ELECTROSURGICAL) ×1 IMPLANT
EVACUATOR SILICONE 100CC (DRAIN) IMPLANT
FLOSEAL 10ML (HEMOSTASIS) ×2 IMPLANT
GLOVE BIO SURGEON STRL SZ 6.5 (GLOVE) ×2 IMPLANT
GLOVE BIOGEL PI IND STRL 6.5 (GLOVE) ×1 IMPLANT
GLOVE BIOGEL PI IND STRL 8.5 (GLOVE) ×1 IMPLANT
GLOVE BIOGEL PI INDICATOR 6.5 (GLOVE) ×1
GLOVE BIOGEL PI INDICATOR 8.5 (GLOVE) ×1
GLOVE SS BIOGEL STRL SZ 8.5 (GLOVE) ×1 IMPLANT
GLOVE SUPERSENSE BIOGEL SZ 8.5 (GLOVE) ×1
GOWN STRL REUS W/ TWL LRG LVL3 (GOWN DISPOSABLE) IMPLANT
GOWN STRL REUS W/TWL 2XL LVL3 (GOWN DISPOSABLE) ×8 IMPLANT
GOWN STRL REUS W/TWL LRG LVL3 (GOWN DISPOSABLE)
KIT BASIN OR (CUSTOM PROCEDURE TRAY) ×2 IMPLANT
KIT ROOM TURNOVER OR (KITS) ×2 IMPLANT
NEEDLE 18GX1X1/2 (RX/OR ONLY) (NEEDLE) ×2 IMPLANT
NEEDLE 22X1 1/2 (OR ONLY) (NEEDLE) ×2 IMPLANT
NEEDLE SPNL 18GX3.5 QUINCKE PK (NEEDLE) ×4 IMPLANT
NS IRRIG 1000ML POUR BTL (IV SOLUTION) ×4 IMPLANT
PACK LAMINECTOMY ORTHO (CUSTOM PROCEDURE TRAY) ×2 IMPLANT
PACK UNIVERSAL I (CUSTOM PROCEDURE TRAY) ×2 IMPLANT
PAD ARMBOARD 7.5X6 YLW CONV (MISCELLANEOUS) ×6 IMPLANT
PATTIES SURGICAL .5 X.5 (GAUZE/BANDAGES/DRESSINGS) ×2 IMPLANT
PATTIES SURGICAL .5 X1 (DISPOSABLE) ×2 IMPLANT
PUTTY BONE DBX 5CC MIX (Putty) ×2 IMPLANT
SPONGE LAP 4X18 X RAY DECT (DISPOSABLE) ×4 IMPLANT
SPONGE SURGIFOAM ABS GEL 100 (HEMOSTASIS) ×2 IMPLANT
SUT BONE WAX W31G (SUTURE) ×2 IMPLANT
SUT MON AB 3-0 SH 27 (SUTURE)
SUT MON AB 3-0 SH27 (SUTURE) IMPLANT
SUT VIC AB 0 CT1 27 (SUTURE)
SUT VIC AB 0 CT1 27XBRD ANBCTR (SUTURE) IMPLANT
SUT VIC AB 1 CT1 18XCR BRD 8 (SUTURE) IMPLANT
SUT VIC AB 1 CT1 8-18 (SUTURE)
SUT VIC AB 2-0 CT1 18 (SUTURE) ×2 IMPLANT
SYR BULB IRRIGATION 50ML (SYRINGE) ×2 IMPLANT
SYR CONTROL 10ML LL (SYRINGE) ×4 IMPLANT
TOWEL GREEN STERILE (TOWEL DISPOSABLE) ×2 IMPLANT
TOWEL GREEN STERILE FF (TOWEL DISPOSABLE) ×2 IMPLANT
WATER STERILE IRR 1000ML POUR (IV SOLUTION) ×2 IMPLANT
YANKAUER SUCT BULB TIP NO VENT (SUCTIONS) ×2 IMPLANT

## 2017-07-13 NOTE — Evaluation (Signed)
Physical Therapy Evaluation Patient Details Name: Robyn Aguilar MRN: [id number] DOB: [birth year] Today's Date: 07/13/2017   History of Present Illness  Pt is a 67 y.o female s/p L2-3 decompression. PMH includes CHF, DM, seizures, CVA, and R TKA.   Clinical Impression  Patient is s/p above surgery resulting in the deficits listed below (see PT Problem List). Pt limited in gait tolerance secondary to pain. Very slow gait speed and slightly unsteady requiring min to min guard A. Anticipate pt will progress well once pain controlled. Reviewed generalized walking program and back precautions. Patient will benefit from skilled PT to increase their independence and safety with mobility (while adhering to their precautions) to allow discharge to the venue listed below.     Follow Up Recommendations Home health PT;Supervision for mobility/OOB    Equipment Recommendations  None recommended by PT    Recommendations for Other Services       Precautions / Restrictions Precautions Precautions: Back Precaution Booklet Issued: Yes (comment) Precaution Comments: Reviewed back precautions with pt and family.  Required Braces or Orthoses: Spinal Brace Spinal Brace: Lumbar corset;Applied in sitting position Restrictions Weight Bearing Restrictions: No      Mobility  Bed Mobility Overal bed mobility: Needs Assistance Bed Mobility: Rolling;Sidelying to Sit;Sit to Sidelying Rolling: Min assist Sidelying to sit: Min assist     Sit to sidelying: Min assist General bed mobility comments: Min A for rolling and for trunk elevation. Min A for LE lift assist for return to sidelying. Pt required increased time to perform secondary to increased pain.   Transfers Overall transfer level: Needs assistance Equipment used: 2 person hand held assist Transfers: Sit to/from Stand Sit to Stand: Min assist;+2 physical assistance         General transfer comment: Min A +2 for steadying assist and  lift assist. Pt requesting husband to assist on one side. Increased time required to perform transfer secondary to increased pain.   Ambulation/Gait Ambulation/Gait assistance: Min assist;Min guard Ambulation Distance (Feet): 50 Feet Assistive device: Rolling walker (2 wheeled);1 person hand held assist Gait Pattern/deviations: Step-through pattern;Decreased stride length Gait velocity: Very slow  Gait velocity interpretation: Below normal speed for age/gender General Gait Details: Very slow, guarded gait. Verbal cues to keep eyes open. Attempted without RW, however, pt unsteady. With RW increased steadiness. Distance limited secondary to pain. Verbal cues for proximity to device. Verbal cues for generalized walking program to perform at home.   Stairs            Wheelchair Mobility    Modified Rankin (Stroke Patients Only)       Balance Overall balance assessment: Needs assistance Sitting-balance support: No upper extremity supported;Feet supported Sitting balance-Leahy Scale: Fair     Standing balance support: Bilateral upper extremity supported;During functional activity Standing balance-Leahy Scale: Poor Standing balance comment: Reliant on BUE support.                              Pertinent Vitals/Pain Pain Assessment: 0-10 Pain Score: 8  Pain Location: back  Pain Descriptors / Indicators: Aching;Operative site guarding;Moaning Pain Intervention(s): Limited activity within patient's tolerance;Monitored during session;Repositioned    Home Living Family/patient expects to be discharged to:: Private residence Living Arrangements: Spouse/significant other Available Help at Discharge: Family;Available 24 hours/day Type of Home: House Home Access: Stairs to enter Entrance Stairs-Rails: Right;Left;Can reach both Entrance Stairs-Number of Steps: 4 Home Layout: One level Home Equipment: Walker - 2 wheels;Bedside  commode      Prior Function Level of  Independence: Independent               Hand Dominance   Dominant Hand: Right    Extremity/Trunk Assessment   Upper Extremity Assessment Upper Extremity Assessment: Defer to OT evaluation    Lower Extremity Assessment Lower Extremity Assessment: Generalized weakness;RLE deficits/detail RLE Deficits / Details: Reports numbness in RLE is improved.     Cervical / Trunk Assessment Cervical / Trunk Assessment: Other exceptions Cervical / Trunk Exceptions: s/p lumbar surgery   Communication   Communication: No difficulties  Cognition Arousal/Alertness: Awake/alert Behavior During Therapy: WFL for tasks assessed/performed Overall Cognitive Status: Within Functional Limits for tasks assessed                                        General Comments General comments (skin integrity, edema, etc.): Pt's husband present during session.     Exercises     Assessment/Plan    PT Assessment Patient needs continued PT services  PT Problem List Decreased strength;Decreased activity tolerance;Decreased balance;Decreased mobility;Decreased knowledge of use of DME;Decreased knowledge of precautions;Pain       PT Treatment Interventions DME instruction;Gait training;Stair training;Therapeutic exercise;Therapeutic activities;Functional mobility training;Balance training;Neuromuscular re-education;Patient/family education    PT Goals (Current goals can be found in the Care Plan section)  Acute Rehab PT Goals Patient Stated Goal: To decrease pain  PT Goal Formulation: With patient Time For Goal Achievement: 07/27/17 Potential to Achieve Goals: Good    Frequency Min 5X/week   Barriers to discharge        Co-evaluation               AM-PAC PT "6 Clicks" Daily Activity  Outcome Measure Difficulty turning over in bed (including adjusting bedclothes, sheets and blankets)?: Unable Difficulty moving from lying on back to sitting on the side of the bed? :  Unable Difficulty sitting down on and standing up from a chair with arms (e.g., wheelchair, bedside commode, etc,.)?: Unable Help needed moving to and from a bed to chair (including a wheelchair)?: A Little Help needed walking in hospital room?: A Little Help needed climbing 3-5 steps with a railing? : A Lot 6 Click Score: 11    End of Session Equipment Utilized During Treatment: Gait belt;Back brace Activity Tolerance: Patient limited by pain Patient left: in bed;with call bell/phone within reach;with family/visitor present Nurse Communication: Mobility status PT Visit Diagnosis: Other abnormalities of gait and mobility (R26.89);Unsteadiness on feet (R26.81);Pain Pain - part of body: (back )    Time: 5732-2025 PT Time Calculation (min) (ACUTE ONLY): 31 min   Charges:   PT Evaluation $PT Eval Moderate Complexity: 1 Mod PT Treatments $Gait Training: 8-22 mins   PT G Codes:        Leighton Ruff, PT, DPT  Acute Rehabilitation Services  Pager: [phone]   Rudean Hitt 07/13/2017, 6:37 PM

## 2017-07-13 NOTE — Op Note (Signed)
Operative report  Preoperative diagnosis L2-3 disc herniation with foraminal extension right side  Postoperative diagnosis: Same  Operative procedure: 1.  Unilateral Gill decompression right side L2-3.  Complete facetectomy and foraminotomy right side.    2.  In situ fusion L2-3.  First Assistant: Ronette Deter, PA  Intraoperative findings: Small disc protrusion into the right L2 foramen.  While performing a foraminotomy pars fracture resulting in the need to completely resect the pars and the inferior L2 facet.  Given the potential for instability I elected to move forward with the in situ fusion.  I have discussed this with the husband, and will discuss it with the patient when she is alert and orientated.  Allograft source: vivogen and DBX mix  Operative report  Patient was brought the operating room placed upon the operating table.  After successful induction of general anesthesia and endotracheally patient teds SCDs were applied and she was turned prone onto the Wilson frame.  All bony prominences were well-padded and the back was prepped and draped in a standard fashion.  Timeout was taken to confirm patient procedure and all other important data.  2 needles were then placed in the back to localize the incision.  Digital lateral x-ray was taken confirming where to make my incision.  I marked this out and infiltrated it with quarter percent Marcaine with epinephrine.  The midline incision was made sharply dissected down to the deep fascia.  I then incised the deep fascia and stripped the paraspinal muscles to expose the L2 and L3 spinous process as well as the L2 lamina and the L2-3 facet complex.  Self-retaining retractor was placed.  Penfield 4 was placed underneath the L2 lamina and a second x-ray was taken to confirm I was at the appropriate level.   I then performed a right-sided laminotomy of L2 using 2 and 3 mm Kerrison punch.  Once this was completed I dissected through the ligamentum  flavum with a Penfield 4 and then resected the ligamentum flavum to expose the posterior lateral aspect of the thecal sac.  I then gently dissected into the lateral recess where noted very large prominent epidural veins.  I then continued with a medial facetectomy in order to adequately visualize the epidural veins in the lateral recess.  Using bipolar cautery I coagulated the epidural veins.  I then began working cephalad going towards the L2 foramen.  I did perform a medial foraminotomy with a 2 mm Kerrison rongeur.  Unfortunately the pars fractured completely compromising the inferior L2 facet.  At this point I completely resected the inferior facet as it was no longer stable.  I could now visualize the medial border of the L3 pedicle and the L2-3 disc space and the exiting L3 nerve root.  I continued to dissect into the foramen coagulating the large epidural veins.  Identified the L2 nerve root and then began tracing it back towards the thecal sac.  Using a 2 and 3 mm Kerrison punch I resected the superior portion of the foramen so that had an adequate L2 neural decompression in the foramen itself.  There was a small disc bulge noted which I resected but there is no large free fragment of disc material that I could identify.  I then continued it into the lateral recess to ensure had an adequate decompression.  I could now use my [facility] and palpate down towards the L3 foramen and along the entire course of the L2 nerve root confirming complete decompression.  At this point time with the decompression complete I was concerned about the potential for instability.  As a result I elected to do an in situ fusion on the contralateral side in order to stabilize this level.  I used the bone graft harvested during the decompression along with allograft source.  I exposed the lateral side of the facet complex and then ultimately L2 and L3 transverse processes.  Once they were exposed I used a high-speed bur to  decorticate the and then packed the posterior lateral gutter with bone.  Once this was complete I irrigated the wound copiously normal saline and ensured hemostasis using bipolar electrocautery and FloSeal.  I then placed 1 cc of Depo-Medrol (40 mg) over the exposed thecal sac for postoperative analgesia.  I then placed a large thrombin-soaked Gelfoam patty over the entire laminotomy defect site.  I then closed the wound in a layered fashion with interrupted #1 Vicryl suture, 2-0 Vicryl suture, and 3-0 Monocryl for the skin.  Steri-Strips and dry dressing were applied the patient was ultimately extubated transferred the PACU that incident.  The end of the case all needle sponge counts were correct.

## 2017-07-13 NOTE — Discharge Instructions (Signed)
Lumbar Diskectomy, Care After °Refer to this sheet in the next few weeks. These instructions provide you with information about caring for yourself after your procedure. Your health care provider may also give you more specific instructions. Your treatment has been planned according to current medical practices, but problems sometimes occur. Call your health care provider if you have any problems or questions after your procedure. °What can I expect after the procedure? °After the procedure, it is common to have: °· Pain. °· Numbness. °· Weakness. ° °Follow these instructions at home: °Medicines °· Take medicines only as directed by your health care provider. °· If you were prescribed an antibiotic medicine, finish all of it even if you start to feel better. °Incision care °· There are many different ways to close and cover an incision, including stitches (sutures), skin glue, and adhesive strips. Follow your health care provider's instructions about: °? Incision care. °? Bandage (dressing) changes and removal. °? Incision closure removal. °· Check your incision area every day for signs of infection. If you cannot see your incision, have someone check it for you. Watch for: °? Redness, swelling, or pain. °? Fluid, blood, or pus. °Activity °· Avoid sitting for longer than 20 minutes at a time or as directed by your health care provider. °· Do not climb stairs more than once each day until your health care provider approves. °· Do not bend at your waist. To pick things up, bend your knees. °· Do not lift anything that is heavier than 10 lb (4.5 kg) or as directed by your health care provider. °· Do not drive a car until your health care provider approves. °· Ask your health care provider when you may return to your normal activities, such as playing sports and going back to work. °· Work with your physical therapist to learn safe movement and exercises to help healing. Do these exercises as directed. °· Take short  walks often. °General instructions °· Do not use any tobacco products, including cigarettes, chewing tobacco, or electronic cigarettes. If you need help quitting, ask your health care provider. °· Follow your health care provider’s instructions about bathing. Do not take baths, shower, swim, or use a hot tub until your health care provider approves. °· Wear your back brace as directed by your health care provider. °· To prevent constipation: °? Drink enough fluid to keep your urine clear or pale yellow. °? Eat plenty of fruits, vegetables, and whole grains. °· Keep all follow-up visits as directed by your health care provider. This is important. This includes any follow-up visits with your physical therapist. °Contact a health care provider if: °· You have a fever. °· You have redness, swelling, or pain in your incision area. °· Your pain is not controlled with medicine. °· You have pain, numbness, or weakness that lasts longer than three weeks after surgery. °· You become constipated. °Get help right away if: °· You have fluid, blood, or pus coming from your incision. °· You have increasing pain, numbness, or weakness. °· You lose control of when you urinate or have a bowel movement (incontinence). °· You have chest pain. °· You have trouble breathing. °This information is not intended to replace advice given to you by your health care provider. Make sure you discuss any questions you have with your health care provider. °Document Released: 03/23/2004 Document Revised: 09/24/2015 Document Reviewed: 12/11/2013 °Elsevier Interactive Patient Education © 2018 [employer] ° °

## 2017-07-13 NOTE — Brief Op Note (Signed)
07/13/2017  10:37 AM  PATIENT:  [name]  67 y.o. female  PRE-OPERATIVE DIAGNOSIS:  Radiculopathy L2-3 right  POST-OPERATIVE DIAGNOSIS:  Radiculopathy L2-3 right  PROCEDURE:  Procedure(s) with comments: Disectomy Right L2-3 (Right) - 2.5 hrs  SURGEON:  Surgeon(s) and Role:    Melina Schools, MD - Primary  PHYSICIAN ASSISTANT:   ASSISTANTS: Carmen Mayo   ANESTHESIA:   general  EBL:  150 mL   BLOOD ADMINISTERED:none  DRAINS: none   LOCAL MEDICATIONS USED:  MARCAINE    and OTHER DepoMedrol (40cc)  SPECIMEN:  No Specimen  DISPOSITION OF SPECIMEN:  N/A  COUNTS:  YES  TOURNIQUET:  * No tourniquets in log *  DICTATION: .Dragon Dictation  PLAN OF CARE: Admit for overnight observation  PATIENT DISPOSITION:  PACU - hemodynamically stable.

## 2017-07-13 NOTE — Anesthesia Procedure Notes (Addendum)
Procedure Name: Intubation Date/Time: 07/13/2017 7:45 AM Performed by: Raenette Rover, CRNA Pre-anesthesia Checklist: Patient identified, Emergency Drugs available, Suction available and Patient being monitored Patient Re-evaluated:Patient Re-evaluated prior to induction Oxygen Delivery Method: Circle system utilized Preoxygenation: Pre-oxygenation with 100% oxygen Induction Type: IV induction Ventilation: Mask ventilation without difficulty Laryngoscope Size: Mac and 3 Grade View: Grade II Tube type: Oral Tube size: 7.0 mm Number of attempts: 1 Airway Equipment and Method: Stylet Placement Confirmation: ETT inserted through vocal cords under direct vision,  positive ETCO2,  CO2 detector and breath sounds checked- equal and bilateral Secured at: 22 cm Tube secured with: Tape Dental Injury: Teeth and Oropharynx as per pre-operative assessment

## 2017-07-13 NOTE — Anesthesia Postprocedure Evaluation (Signed)
Anesthesia Post Note  Patient: Robyn Aguilar  Procedure(s) Performed: Disectomy Right L2-3 (Right )     Patient location during evaluation: PACU Anesthesia Type: General Level of consciousness: awake and alert Pain management: pain level controlled Vital Signs Assessment: post-procedure vital signs reviewed and stable Respiratory status: spontaneous breathing, nonlabored ventilation, respiratory function stable and patient connected to nasal cannula oxygen Cardiovascular status: blood pressure returned to baseline and stable Postop Assessment: no apparent nausea or vomiting Anesthetic complications: no    Last Vitals:  Vitals:   07/13/17 1155 07/13/17 1226  BP: (!) 135/91 131/87  Pulse: 87 88  Resp: 16 16  Temp: (!) 36.4 C 36.4 C  SpO2: 98% 95%    Last Pain:  Vitals:   07/13/17 1155  TempSrc:   PainSc: 3                  Tiajuana Amass

## 2017-07-13 NOTE — Transfer of Care (Signed)
Immediate Anesthesia Transfer of Care Note  Patient: Nyhla Alston-Pettiford  Procedure(s) Performed: Disectomy Right L2-3 (Right )  Patient Location: PACU  Anesthesia Type:General  Level of Consciousness: awake, alert , oriented, drowsy and patient cooperative  Airway & Oxygen Therapy: Patient Spontanous Breathing and Patient connected to nasal cannula oxygen  Post-op Assessment: Report given to RN, Post -op Vital signs reviewed and stable and Patient moving all extremities X 4  Post vital signs: Reviewed and stable  Last Vitals:  Vitals:   07/13/17 0602 07/13/17 1032  BP: (!) 159/86 (!) (P) 173/73  Pulse: 75 (P) 93  Resp: 17 (!) (P) 21  Temp: 36.6 C (P) 36.8 C  SpO2: 99% (P) 98%    Last Pain:  Vitals:   07/13/17 0602  TempSrc: Oral  PainSc: 0-No pain      Patients Stated Pain Goal: 3 (27/07/86 7544)  Complications: No apparent anesthesia complications

## 2017-07-13 NOTE — Plan of Care (Signed)
  Progressing Safety: Ability to remain free from injury will improve 07/13/2017 2044 - Progressing by Charlena Cross, RN Activity: Ability to avoid complications of mobility impairment will improve 07/13/2017 2044 - Progressing by Charlena Cross, RN Ability to tolerate increased activity will improve 07/13/2017 2044 - Progressing by Charlena Cross, RN Will remain free from falls 07/13/2017 2044 - Progressing by Margot Chimes D, RN Bowel/Gastric: Gastrointestinal status for postoperative course will improve 07/13/2017 2044 - Progressing by Charlena Cross, RN Education: Ability to verbalize activity precautions or restrictions will improve 07/13/2017 2044 - Progressing by Charlena Cross, RN Knowledge of the prescribed therapeutic regimen will improve 07/13/2017 2044 - Progressing by Charlena Cross, RN Understanding of discharge needs will improve 07/13/2017 2044 - Progressing by Charlena Cross, RN Physical Regulation: Ability to maintain clinical measurements within normal limits will improve 07/13/2017 2044 - Progressing by Charlena Cross, RN Postoperative complications will be avoided or minimized 07/13/2017 2044 - Progressing by Charlena Cross, RN Diagnostic test results will improve 07/13/2017 2044 - Progressing by Charlena Cross, RN Pain Management: Pain level will decrease 07/13/2017 2044 - Progressing by Charlena Cross, RN Skin Integrity: Signs of wound healing will improve 07/13/2017 2044 - Progressing by Margot Chimes D, RN Health Behavior/Discharge Planning: Identification of resources available to assist in meeting health care needs will improve 07/13/2017 2044 - Progressing by Charlena Cross, RN Bladder/Genitourinary: Urinary functional status for postoperative course will improve 07/13/2017 2044 - Progressing by Charlena Cross, RN

## 2017-07-14 ENCOUNTER — Encounter (HOSPITAL_COMMUNITY): Payer: Self-pay | Admitting: Orthopedic Surgery

## 2017-07-14 ENCOUNTER — Other Ambulatory Visit: Payer: Self-pay

## 2017-07-14 DIAGNOSIS — M5116 Intervertebral disc disorders with radiculopathy, lumbar region: Secondary | ICD-10-CM | POA: Diagnosis not present

## 2017-07-14 LAB — GLUCOSE, CAPILLARY
Glucose-Capillary: 144 mg/dL — ABNORMAL HIGH (ref 65–99)
Glucose-Capillary: 144 mg/dL — ABNORMAL HIGH (ref 65–99)

## 2017-07-14 NOTE — Evaluation (Signed)
Occupational Therapy Evaluation Patient Details Name: Robyn Aguilar MRN: [id number] DOB: March 01, 1951 Today's Date: 07/14/2017    History of Present Illness Pt is a 67 y.o female s/p L2-3 decompression. PMH includes CHF, DM, seizures, CVA, and R TKA.    Clinical Impression   This 67 y/o F presents with the above. At baseline pt is independent with ADLs and functional mobility. Pt lives with spouse who is available to assist with ADLs PRN after return home. Pt completing room level functional mobility at RW level with MinGuard assist this session; currently requires minA for LB ADLs secondary to adhering to back precautions. Education provided on back precautions, AE, safety and compensatory techniques for completing ADLs and functional transfers with pt verbalizing/return demonstrating understanding; questions answered throughout. Feel pt is safe to return home once medically ready with available spouse assist. No further acute OT needs identified at this time. Will sign off.     Follow Up Recommendations  Supervision/Assistance - 24 hour;Follow surgeon's recommendation for DC plan and follow-up therapies    Equipment Recommendations  None recommended by OT           Precautions / Restrictions Precautions Precautions: Back Precaution Comments: Reviewed back precautions with pt and family; pt able to recall 3/3 back precautions  Required Braces or Orthoses: Spinal Brace Spinal Brace: Lumbar corset;Applied in sitting position Restrictions Weight Bearing Restrictions: No      Mobility Bed Mobility               General bed mobility comments: sitting EOB start of session   Transfers Overall transfer level: Needs assistance Equipment used: Rolling walker (2 wheeled) Transfers: Sit to/from Stand Sit to Stand: Min guard         General transfer comment: minguard for safety, verbal cues for safe hand placement initially     Balance Overall balance assessment:  Needs assistance Sitting-balance support: No upper extremity supported;Feet supported Sitting balance-Leahy Scale: Good     Standing balance support: Bilateral upper extremity supported;During functional activity Standing balance-Leahy Scale: Poor Standing balance comment: Reliant on BUE support.                            ADL either performed or assessed with clinical judgement   ADL Overall ADL's : Needs assistance/impaired Eating/Feeding: Modified independent;Sitting   Grooming: Min guard;Standing   Upper Body Bathing: Min guard;Sitting   Lower Body Bathing: Min guard;Sit to/from stand;With adaptive equipment   Upper Body Dressing : Set up;Min guard;Sitting Upper Body Dressing Details (indicate cue type and reason): donning overhead shirt and lumbar brace  Lower Body Dressing: Minimal assistance;With adaptive equipment;Sit to/from stand;Min guard Lower Body Dressing Details (indicate cue type and reason): pt using reacher to don underwear/pants with min verbal cues; minA for socks this session (educated on use of sock aide); minguard in standing while pt advances pants over hips  Toilet Transfer: Min guard;Ambulation;BSC;RW Toilet Transfer Details (indicate cue type and reason): BSC over toilet; simulated in transfer to reclienr  Toileting- Clothing Manipulation and Hygiene: Minimal assistance;Sit to/from stand;Cueing for back precautions;With adaptive equipment Toileting - Clothing Manipulation Details (indicate cue type and reason): educated on AE for peri-care after BM    Tub/Shower Transfer Details (indicate cue type and reason): educated on safe transfer technique to tub bench  Functional mobility during ADLs: Min guard;Rolling walker General ADL Comments: education provided on back precautions, AE, safety and compensatory techniques for completing ADLs and functional transfers  Pertinent Vitals/Pain Pain Assessment: Faces Faces  Pain Scale: Hurts little more Pain Location: back  Pain Descriptors / Indicators: Aching;Operative site guarding Pain Intervention(s): Monitored during session;Premedicated before session     Hand Dominance Right   Extremity/Trunk Assessment Upper Extremity Assessment Upper Extremity Assessment: Overall WFL for tasks assessed   Lower Extremity Assessment Lower Extremity Assessment: Defer to PT evaluation   Cervical / Trunk Assessment Cervical / Trunk Exceptions: s/p lumbar surgery    Communication Communication Communication: No difficulties   Cognition Arousal/Alertness: Lethargic;Suspect due to medications(easy to arouse, though sleepy ) Behavior During Therapy: [facility] for tasks assessed/performed Overall Cognitive Status: Within Functional Limits for tasks assessed                                                      Home Living Family/patient expects to be discharged to:: Private residence Living Arrangements: Spouse/significant other Available Help at Discharge: Family;Available 24 hours/day Type of Home: House Home Access: Stairs to enter [employer] of Steps: 4 Entrance Stairs-Rails: Right;Left;Can reach both Home Layout: One level     Bathroom Shower/Tub: Teacher, early years/pre: Standard     Home Equipment: Environmental consultant - 2 wheels;Bedside commode;Tub bench          Prior Functioning/Environment Level of Independence: Independent                 OT Problem List: Decreased activity tolerance;Decreased knowledge of use of DME or AE;Decreased knowledge of precautions            OT Goals(Current goals can be found in the care plan section) Acute Rehab OT Goals Patient Stated Goal: To decrease pain  OT Goal Formulation: All assessment and education complete, DC therapy                                 AM-PAC PT "6 Clicks" Daily Activity     Outcome Measure Help from another person eating meals?:  None Help from another person taking care of personal grooming?: None Help from another person toileting, which includes using toliet, bedpan, or urinal?: A Little Help from another person bathing (including washing, rinsing, drying)?: A Little Help from another person to put on and taking off regular upper body clothing?: A Little Help from another person to put on and taking off regular lower body clothing?: A Little 6 Click Score: 20   End of Session Equipment Utilized During Treatment: Rolling walker;Gait belt;Back brace Nurse Communication: Mobility status  Activity Tolerance: Patient tolerated treatment well Patient left: in chair;with call bell/phone within reach;with family/visitor present  OT Visit Diagnosis: Other abnormalities of gait and mobility (R26.89)                Time: 5456-2563 OT Time Calculation (min): 26 min Charges:  OT General Charges $OT Visit: 1 Visit OT Evaluation $OT Eval Low Complexity: 1 Low OT Treatments $Self Care/Home Management : 8-22 mins G-Codes:     Lou Cal, OT Pager [phone] 07/14/2017  Raymondo Band 07/14/2017, 10:42 AM

## 2017-07-14 NOTE — Progress Notes (Signed)
    Subjective: Procedure(s) (LRB): Disectomy Right L2-3 (Right) 1 Day Post-Op  Patient reports pain as 2 on 0-10 scale.  Reports decreased leg pain reports incisional back pain   Positive void Negative bowel movement Positive flatus Negative chest pain or shortness of breath  Objective: Vital signs in last 24 hours: Temp:  [97.6 F (36.4 C)-98.3 F (36.8 C)] 98 F (36.7 C) (03/15 0807) Pulse Rate:  [73-88] 76 (03/15 0807) Resp:  [16] 16 (03/15 0807) BP: (118-134)/(72-87) 118/77 (03/15 0807) SpO2:  [95 %-100 %] 100 % (03/15 0807)  Intake/Output from previous day: 03/14 0701 - 03/15 0700 In: 1243 [P.O.:240; I.V.:903; IV Piggyback:100] Out: 150 [Blood:150]  Labs: No results for input(s): WBC, RBC, HCT, PLT in the last 72 hours. No results for input(s): NA, K, CL, CO2, BUN, CREATININE, GLUCOSE, CALCIUM in the last 72 hours. No results for input(s): LABPT, INR in the last 72 hours.  Physical Exam: Neurologically intact ABD soft Intact pulses distally Incision: dressing C/D/I Compartment soft Body mass index is 36.22 kg/m.   Assessment/Plan: Patient stable  xrays n/a Continue mobilization with physical therapy Continue care  Advance diet Up with therapy  No radicular leg pain - ambulating well Ok for d/c to home f/u in 2 weeks  Melina Schools, MD [place] [phone]

## 2017-07-14 NOTE — Progress Notes (Signed)
Patient is discharged from room 3C09 at this time. Alert and in stable condition. IV site d/c'd and instructions read to patient and spouse with understanding verbalized. Left unit via wheelchair with all belongings at side. 

## 2017-07-14 NOTE — Progress Notes (Signed)
Physical Therapy Treatment Patient Details Name: Robyn Aguilar MRN: [id number] DOB: 1950-09-20 Today's Date: 07/14/2017    History of Present Illness Pt is a 67 y.o female s/p L2-3 decompression. PMH includes CHF, DM, seizures, CVA, and R TKA.     PT Comments    Pt progressing towards physical therapy goals. Was able to perform transfers and ambulation with gross min guard assist for balance support and safety. Pt with difficulty maintaining eyes open throughout session - even during gait training. Husband present for education as well and supportive/encouraging pt to participate. Pt and husband were educated on brace application/wearing schedule, car transfer, safe activity progression, and precautions. Will continue to follow.    Follow Up Recommendations  Home health PT;Supervision for mobility/OOB     Equipment Recommendations  None recommended by PT    Recommendations for Other Services       Precautions / Restrictions Precautions Precautions: Back Precaution Booklet Issued: Yes (comment) Precaution Comments: Reviewed back precautions with pt and family; pt able to recall 3/3 back precautions  Required Braces or Orthoses: Spinal Brace Spinal Brace: Lumbar corset;Applied in sitting position Restrictions Weight Bearing Restrictions: No    Mobility  Bed Mobility               General bed mobility comments: Pt received ambulating in hall with husband  Transfers Overall transfer level: Needs assistance Equipment used: Rolling walker (2 wheeled) Transfers: Sit to/from Stand Sit to Stand: Supervision         General transfer comment: Close supervision for safety as pt initiated stand>sit. VC's for hand placement on seated surface for safety.   Ambulation/Gait Ambulation/Gait assistance: Min guard Ambulation Distance (Feet): 200 Feet Assistive device: Rolling walker (2 wheeled) Gait Pattern/deviations: Step-through pattern;Decreased stride  length Gait velocity: Very slow  Gait velocity interpretation: Below normal speed for age/gender General Gait Details: Slow and guarded due to lethargy. With RW pt fairly steady. Close guard provided throughout for safety.    Stairs Stairs: Yes   Stair Management: Step to pattern;Forwards;One rail Right Number of Stairs: 5 General stair comments: VC's for sequencing and general safety. Railing for support.   Wheelchair Mobility    Modified Rankin (Stroke Patients Only)       Balance Overall balance assessment: Needs assistance Sitting-balance support: No upper extremity supported;Feet supported Sitting balance-Leahy Scale: Good     Standing balance support: Bilateral upper extremity supported;During functional activity Standing balance-Leahy Scale: Poor Standing balance comment: Reliant on BUE support.                             Cognition Arousal/Alertness: Lethargic;Suspect due to medications(easy to arouse, though sleepy ) Behavior During Therapy: WFL for tasks assessed/performed Overall Cognitive Status: Within Functional Limits for tasks assessed                                        Exercises      General Comments        Pertinent Vitals/Pain Pain Assessment: Faces Faces Pain Scale: Hurts little more Pain Location: back  Pain Descriptors / Indicators: Aching;Operative site guarding Pain Intervention(s): Monitored during session    Home Living Family/patient expects to be discharged to:: Private residence Living Arrangements: Spouse/significant other Available Help at Discharge: Family;Available 24 hours/day Type of Home: House Home Access: Stairs to enter Entrance Stairs-Rails: Right;Left;Can reach  both Home Layout: One level Home Equipment: [place] - 2 wheels;Bedside commode;Tub bench      Prior Function Level of Independence: Independent          PT Goals (current goals can now be found in the care plan section)  Acute Rehab PT Goals Patient Stated Goal: To decrease pain  PT Goal Formulation: With patient Time For Goal Achievement: 07/27/17 Potential to Achieve Goals: Good Progress towards PT goals: Progressing toward goals    Frequency    Min 5X/week      PT Plan Current plan remains appropriate    Co-evaluation              AM-PAC PT "6 Clicks" Daily Activity  Outcome Measure  Difficulty turning over in bed (including adjusting bedclothes, sheets and blankets)?: A Little Difficulty moving from lying on back to sitting on the side of the bed? : A Little Difficulty sitting down on and standing up from a chair with arms (e.g., wheelchair, bedside commode, etc,.)?: A Little Help needed moving to and from a bed to chair (including a wheelchair)?: A Little Help needed walking in hospital room?: A Little Help needed climbing 3-5 steps with a railing? : A Little 6 Click Score: 18    End of Session Equipment Utilized During Treatment: Gait belt;Back brace Activity Tolerance: Patient limited by pain Patient left: in bed;with call bell/phone within reach;with family/visitor present Nurse Communication: Mobility status PT Visit Diagnosis: Other abnormalities of gait and mobility (R26.89);Unsteadiness on feet (R26.81);Pain Pain - part of body: (back )     Time: 1779-3903 PT Time Calculation (min) (ACUTE ONLY): 32 min  Charges:  $Gait Training: 23-37 mins                    G Codes:       Rolinda Roan, PT, DPT Acute Rehabilitation Services Pager: [phone]    Thelma Comp 07/14/2017, 11:52 AM

## 2017-07-19 NOTE — Discharge Summary (Signed)
Physician Discharge Summary  Patient ID: Robyn Aguilar MRN: [id number] DOB/AGE: 68/03/1951 68 y.o.  Admit date: 07/13/2017 Discharge date: 07/14/17  Admission Diagnoses:  L2-3 disc herniation  Discharge Diagnoses:  Active Problems:   Status post lumbar spine operative procedure for decompression of spinal cord   Past Medical History:  Diagnosis Date  . Allergic rhinitis   . Arthritis    knees and neck  . Cataracts, bilateral    immature   . CHF (congestive heart failure) ([place]) 2014   after surgery  . Complication of anesthesia    had heart failure after knee surgery 02/04/13  . DDD (degenerative disc disease), lumbar   . Diabetes mellitus without complication ([place])   . Dizziness    takes Antivert prn  . GERD (gastroesophageal reflux disease)    takes Aciphex prn  . High cholesterol    but doesn't take any meds  . History of colon polyps   . History of migraine    last one 6-35months ago;takes Topamax nightly  . Hypertension    takes Metoprolol and maxzide daily  . Joint pain   . Joint swelling   . Migraine   . Migraines   . Pneumonia    at age 28  . Seizures ([place])    hx of;last one 50yrs ago;no meds required;states it was brought on by med;only one ever had  . Shortness of breath 02/06/2013  . Sleep apnea    sleep study in epic from 2014-uses CPAP  . Stroke [facility])    2 TIA  . TIA (transient ischemic attack)     Surgeries: Procedure(s): Disectomy Right L2-3 on 07/13/2017   Consultants (if any):   Discharged Condition: Improved  Hospital Course: Robyn Aguilar is an 67 y.o. female who was admitted 07/13/2017 with a diagnosis of L2-3 disc herniation and went to the operating room on 07/13/2017 and underwent the above named procedures.  Post op day 1 pt reports low level of pain. Pt reports improved leg pain.  Pt is urinating w/o difficulty.Pt is ambulating in hallway.  Pt is cleared by PT for Dc.  She was given perioperative antibiotics:   Anti-infectives (From admission, onward)   Start     Dose/Rate Route Frequency Ordered Stop   07/13/17 1600  ceFAZolin (ANCEF) IVPB 2g/100 mL premix     2 g 200 mL/hr over 30 Minutes Intravenous Every 8 hours 07/13/17 1225 07/14/17 0057   07/13/17 0600  ceFAZolin (ANCEF) IVPB 2g/100 mL premix     2 g 200 mL/hr over 30 Minutes Intravenous To ShortStay Surgical 07/12/17 1054 07/13/17 0742    .  She was given sequential compression devices, early ambulation, and TED for DVT prophylaxis.  She benefited maximally from the hospital stay and there were no complications.    Recent vital signs:  Vitals:   07/14/17 0807 07/14/17 1317  BP: 118/77 114/76  Pulse: 76 64  Resp: 16 16  Temp: 98 F (36.7 C) 98.3 F (36.8 C)  SpO2: 100% 100%    Recent laboratory studies:  Lab Results  Component Value Date   HGB 13.4 07/05/2017   HGB 13.7 10/18/2013   HGB 11.2 (L) 02/07/2013   Lab Results  Component Value Date   WBC 3.9 (L) 07/05/2017   PLT 239 07/05/2017   Lab Results  Component Value Date   INR 0.87 01/25/2013   Lab Results  Component Value Date   NA 138 07/05/2017   K 3.4 (L) 07/05/2017   CL 101  07/05/2017   CO2 28 07/05/2017   BUN 15 07/05/2017   CREATININE 0.97 07/05/2017   GLUCOSE 131 (H) 07/05/2017    Discharge Medications:   Allergies as of 07/14/2017      Reactions   Codeine Hives   Statins Other (See Comments)   Muscle pain   Adhesive [tape] Rash   Latex Rash      Medication List    STOP taking these medications   acetaminophen 500 MG tablet Commonly known as:  TYLENOL   aspirin 81 MG tablet   meperidine 50 MG tablet Commonly known as:  DEMEROL     TAKE these medications   cetirizine 10 MG tablet Commonly known as:  ZYRTEC Take 10 mg by mouth at bedtime.   cholecalciferol 1000 units tablet Commonly known as:  VITAMIN D Take 1,000 Units by mouth daily.   fluticasone 50 MCG/ACT nasal spray Commonly known as:  FLONASE Place 1 spray into both  nostrils at bedtime as needed for allergies or rhinitis.   METAMUCIL FIBER PO Take 2 teaspoons mixed in liquid once daily   methocarbamol 500 MG tablet Commonly known as:  ROBAXIN Take 1 tablet (500 mg total) by mouth 3 (three) times daily.   metoprolol succinate 50 MG 24 hr tablet Commonly known as:  TOPROL-XL Take 50 mg by mouth daily. Take with or immediately following a meal.   ondansetron 4 MG disintegrating tablet Commonly known as:  ZOFRAN ODT Take 1 tablet (4 mg total) by mouth every 8 (eight) hours as needed for nausea or vomiting.   oxyCODONE-acetaminophen 10-325 MG tablet Commonly known as:  PERCOCET Take 1 tablet by mouth every 4 (four) hours as needed for pain.   QUNOL ULTRA COQ10 PO Take 1,000 mg by mouth daily. With turmeric   RABEprazole 20 MG tablet Commonly known as:  ACIPHEX Take 20 mg by mouth daily.   SYSTANE OP Place 1 drop into both eyes daily as needed (dry eyes).   triamterene-hydrochlorothiazide 75-50 MG tablet Commonly known as:  MAXZIDE Take 1 tablet by mouth daily.       Diagnostic Studies: Dg Lumbar Spine 1 View  Result Date: 07/13/2017 CLINICAL DATA:  Lateral lumbar spine images for surgical localization. EXAM: LUMBAR SPINE - 1 VIEW COMPARISON:  Earlier portable lateral view of the lumbar spine performed at 8:20 a.m. FINDINGS: The initial image shows placement of 2 surgical needles, the more superior projecting over the upper aspect of the L3 spinous process, 3.7 cm posterior to the lower posterior margin of the L3 vertebra. The more inferior needle projects 4.2 cm posterior to the posterior lower aspect of the L4 vertebra. Followup image shows placement of a surgical probe through posterior skin retractors. The tip of the probe is superimposed over the posterior lower corner of the L2 vertebra. IMPRESSION: Lateral portable views of the lumbar spine for surgical localization as described. Electronically Signed   By: Lajean Manes M.D.   On:  07/13/2017 11:51   US Renal  Result Date: 06/19/2017 CLINICAL DATA:  Abnormal MRI suggesting left renal cyst. EXAM: RENAL / URINARY TRACT ULTRASOUND COMPLETE COMPARISON:  No comparison study available, including no access to any prior MRI. FINDINGS: Right Kidney: Length: 10.3 cm. Echogenicity within normal limits. No mass or hydronephrosis visualized. Left Kidney: Length: 10.3 cm. Echogenicity is normal. 1.6 x 1.5 x 1.4 cm simple cyst of the lower pole. No solid mass. No hydronephrosis or stone. Bladder: Appears normal for degree of bladder distention. IMPRESSION: Approximately 1.5  cm in diameter simple cyst at the lower pole of the left kidney. No other abnormal finding. Electronically Signed   By: Nelson Chimes M.D.   On: 06/19/2017 13:56   Dg Spine Portable 1 View  Result Date: 07/13/2017 CLINICAL DATA:  ATTENTION: PATIENT UNDER ANESTHESIA IN OPERATING ROOM. PLEASE NUMBER LEVELS OF VERTEBRAL BODIES.Localization image for L2-3 Laminectomy.Please note very small disc at L5-S1. Per MRI. EXAM: PORTABLE SPINE - 1 VIEW COMPARISON:  No prior exams.  Specifically, no previous lumbar MRI. FINDINGS: There is a transitional lumbosacral vertebra. Based on the clinical data, with a small disc reported at L5-S1. The transitional vertebra will be labeled L5. Surgical instrument extending between posterior skin retractors lies 2 cm posterior to the posterior upper aspect of the L3 vertebral body, just below the posterior margin of the L2-L3 disc. IMPRESSION: Lateral lumbar spine image for surgical localization as described. Transitional lumbosacral vertebrae designated L5 as detailed. Electronically Signed   By: Lajean Manes M.D.   On: 07/13/2017 08:44    Disposition:  Post op medication provided Pt will present to clinic in 2 weeks  Discharge Instructions    Incentive spirometry RT   Complete by:  As directed       Follow-up Information    Melina Schools, MD Follow up in 2 week(s).   Specialty:   Orthopedic Surgery Contact information: [address] [place] [address] [phone]            Signed: Valinda Hoar 07/19/2017, 8:12 AM

## 2017-07-21 ENCOUNTER — Encounter (HOSPITAL_COMMUNITY): Payer: Self-pay | Admitting: Orthopedic Surgery

## 2017-11-01 ENCOUNTER — Telehealth: Payer: Self-pay | Admitting: Neurology

## 2017-11-01 NOTE — Telephone Encounter (Signed)
Patient calling stating [employer] says she needs a Rx in order to get a new hose for her CPAP. She says they have faxed request to our office. I advised Dr. Brett Fairy is out of the office and will send to her nurse.

## 2017-11-06 ENCOUNTER — Other Ambulatory Visit: Payer: Self-pay | Admitting: Neurology

## 2017-11-06 DIAGNOSIS — G4733 Obstructive sleep apnea (adult) (pediatric): Secondary | ICD-10-CM

## 2017-11-06 DIAGNOSIS — Z9989 Dependence on other enabling machines and devices: Principal | ICD-10-CM

## 2017-11-06 NOTE — Telephone Encounter (Signed)
I have not received anything for the MD to sign on this patient from [facility]. I have placed the order and sent to Dr Felecia Shelling to sign on Dr Dohmeier's behalf. I have forwarded this order to [facility] from them to help set the patient up with new supplies. Patient must keep upcoming apt.

## 2017-11-27 ENCOUNTER — Encounter: Payer: Self-pay | Admitting: Neurology

## 2017-11-27 ENCOUNTER — Ambulatory Visit: Payer: Medicare Other | Admitting: Neurology

## 2017-11-27 VITALS — BP 125/81 | HR 66 | Ht 65.0 in | Wt 214.0 lb

## 2017-11-27 DIAGNOSIS — G4733 Obstructive sleep apnea (adult) (pediatric): Secondary | ICD-10-CM

## 2017-11-27 DIAGNOSIS — Z9989 Dependence on other enabling machines and devices: Secondary | ICD-10-CM

## 2017-11-27 DIAGNOSIS — M2619 Other specified anomalies of jaw-cranial base relationship: Secondary | ICD-10-CM

## 2017-11-27 DIAGNOSIS — M5137 Other intervertebral disc degeneration, lumbosacral region: Secondary | ICD-10-CM | POA: Diagnosis not present

## 2017-11-27 NOTE — Progress Notes (Signed)
SLEEP MEDICINE CLINIC   Provider:  Larey Seat, M D  Referring Provider: Harlan Stains, MD Primary Care Physician:  Harlan Stains, MD  Chief Complaint  Patient presents with  . Follow-up    pt alone, pt states that she uses her machine every night. pt did not bring her card to download. pt is due for a new machine. and would like to transfer care. DME is [facility]  CPAP compliance visit.   HPI:  11-27-2017   [name] is an established Sleep Apnea Patient; 67 years of age and status post hysterectomy and lumbar fusion and bone spurr reducution. She has had her machine for over 5 years and has always been compliant. She needs a new machine and would like to switch DME- from [facility]. Her last titration study was in April 2014. AHI of 17.3/h and CPAPat 6 cm water.  Mallampati 5, neck 14.5 inches , BMI 35.1 , no change in sleep habits. Epworth 6/ 24. Retired Education officer, [employer].       2018; Robyn Aguilar is a 67 year old African-American lady but recently retired, and has been on CPAP after being diagnosed with obstructive sleep apnea at [facility] sleep. Her baseline AHI was 17.3 but exacerbated during REM sleep to 34.1 and in supine sleep her AHI was 26.7 she did not have significant oxygen desaturations, no significant periodic limb movements and she return for CPAP titration on 10/26/2012. She used CPAP at 6 cm water pressure, which is rather low. She continued to use her CPAP but had problems with the machine being broken, being sent off for repairs and still feels that this is not a reliable CPAP machine. She was told by [facility] [place] that her machine cannot be replaced as it is not 67 years old. She wasn't even given a loaner machine for the time that her CPAP was in repair. The patient has used the machine over the last 90 days 77% of the time for over 4 hours nightly. This time includes a period when she had shoulder surgery and the period when she  couldn't use her machine because it was broken. She also had knee replacement surgery. CPAP is still set at 6 cm water pressure with 2 cm EPR, the patient is an AHI of 1.1, her average usage is 6 hours and 9 minutes there is no need to adjust pressures. She purchased a clean machine . She loves her CPAP- she sleeps better , she is rested and restored.     Robyn Aguilar is a 67 y.o. female , seen here as a referral from Dr. Dema Severin for a re- evaluation of sleep apnea.  [name] reports that she is not sleeping well and that the CPAP machine she is currently using seems to have nothing to do with it. She has not gotten new supplies through [facility] including the nasal pillow. She brought her machine with her today. I would like to "from the patient's sleep study dated 09/28/2012. At the time the patient was 67 years of age and underwent a baseline PSG which revealed an AHI of 17.3 during REM sleep the AHI was 34.1 and in supine sleep 26.7. The lowest oxygen desaturation was at 83% with only 4.7 minutes of desaturation time. There were no significant periodic limb movements noted. The patient kindly agreed to return for a CPAP titration on 10/26/2012 and at that time was titrated to 6 cm water she slept over now her at this  pressure with an AHI of 0.0 and an oxygen nadir of 90%. She reports having compliantly used her machine. She noted air leaks at the nasal pillow.  She is followed by [facility] is her durable medical equipment company, we were able today to obtain a download from her machine but this encompasses the time of November into early December 2016. She has used the machine on average 7 hours and 13 minutes with an 83% compliance and a setting of 6 cm water with 2 cm expiratory pressure relief. Her residual AHI was 2.7. Based on these 66-month-old data she was doing very well with her machine and the machine controlled her apnea well. She has to see her provider  today that she will need a prescription to get for the supplies,  but she also needs her CPAP to be evaluated and she needs a new date chip .  Chief complaint according to patient : Her chief problem is the leakage of air at the nostril level. Sleep habits are as follows in 2014 :  After work she comes home , she sleeps in a recliner ( had rotator cuff surgery ) she has PT/ OT in afternoons.She goes to sleep at 9.30 PM and goes to urinate at 2 AM, mostly returns to sleep quickly. Average sleep time over night is 6.5 hours.  She feels the air leaking on the side of her nostrils. Every time she will get a new nasal pillow she has an acute sense that the air leaks are diminished but once she has them for a  couple of weeks, the air leak returned. Leaves at 5.30 AM for work. 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  Social history: recent rotator cuff surgery, on meloxicam prn.    Review of Systems: Out of a complete 14 system review, the patient complains of only the following symptoms, and all other reviewed systems are negative.  Epworth score 6, Fatigue severity score 30, not snoiring on CPAP, 6 cm water.    Social History   Socioeconomic History  . Marital status: Married    Spouse name: Not on file  . Number of children: 2  . Years of education: college  . Highest education level: Not on file  Occupational History    Employer: [employer]  . Financial resource strain: Not on file  . Food insecurity:    Worry: Not on file    Inability: Not on file  . Transportation needs:    Medical: Not on file    Non-medical: Not on file  Tobacco Use  . Smoking status: Never Smoker  . Smokeless tobacco: Never Used  Substance and Sexual Activity  . Alcohol use: No    Alcohol/week: 0.0 oz  . Drug use: No  . Sexual activity: Yes    Birth control/protection: Surgical  Lifestyle  . Physical activity:    Days per week: Not on file    Minutes per  session: Not on file  . Stress: Not on file  Relationships  . Social connections:    Talks on phone: Not on file    Gets together: Not on file    Attends religious service: Not on file    Active member of club or organization: Not on file    Attends meetings of clubs or organizations: Not on file    Relationship status: Not on file  . Intimate partner violence:    Fear of current  or ex partner: Not on file    Emotionally abused: Not on file    Physically abused: Not on file    Forced sexual activity: Not on file  Other Topics Concern  . Not on file  Social History Narrative   Lives with husband   Caffeine use:    Family History  Problem Relation Age of Onset  . Hypertension Mother   . Pancreatic cancer Mother   . Prostate cancer Other     Past Medical History:  Diagnosis Date  . Allergic rhinitis   . Arthritis    knees and neck  . Cataracts, bilateral    immature   . CHF (congestive heart failure) ([place]) 2014   after surgery  . Complication of anesthesia    had heart failure after knee surgery 02/04/13  . DDD (degenerative disc disease), lumbar   . Diabetes mellitus without complication ([place])   . Dizziness    takes Antivert prn  . GERD (gastroesophageal reflux disease)    takes Aciphex prn  . High cholesterol    but doesn't take any meds  . History of colon polyps   . History of migraine    last one 6-21months ago;takes Topamax nightly  . Hypertension    takes Metoprolol and maxzide daily  . Joint pain   . Joint swelling   . Migraine   . Migraines   . Pneumonia    at age 24  . Seizures ([place])    hx of;last one 25yrs ago;no meds required;states it was brought on by med;only one ever had  . Shortness of breath 02/06/2013  . Sleep apnea    sleep study in epic from 2014-uses CPAP  . Stroke [facility])    2 TIA  . TIA (transient ischemic attack)     Past Surgical History:  Procedure Laterality Date  . ABDOMINAL HYSTERECTOMY  1991  . carpel tunnel Right 1988  .  COLONOSCOPY    . disc surgery  1996  . ESOPHAGOGASTRODUODENOSCOPY    . EYE SURGERY Bilateral    cataract surgery with lens implants  . HERNIA REPAIR  1987   x 2  . KNEE SURGERY Left 2011   replacement-partial  . LUMBAR LAMINECTOMY/DECOMPRESSION MICRODISCECTOMY Right 07/13/2017   Procedure: Disectomy Right L2-3;  Surgeon: Melina Schools, MD;  Location: [place];  Service: Orthopedics;  Laterality: Right;  2.5 hrs  . ROBOTIC ASSISTED BILATERAL SALPINGO OOPHERECTOMY Bilateral 10/22/2013   Procedure: ROBOTIC ASSISTED BILATERAL SALPINGO OOPHORECTOMY;  Surgeon: Imagene Gurney A. Alycia Rossetti, MD;  Location: [facility];  Service: Gynecology;  Laterality: Bilateral;  . ROTATOR CUFF REPAIR Right   . TOTAL KNEE ARTHROPLASTY Right   . TOTAL KNEE ARTHROPLASTY Right 02/04/2013   Procedure: RIGHT TOTAL KNEE ARTHROPLASTY;  Surgeon: Vickey Huger, MD;  Location: [place];  Service: Orthopedics;  Laterality: Right;    Current Outpatient Medications  Medication Sig Dispense Refill  . cetirizine (ZYRTEC) 10 MG tablet Take 10 mg by mouth at bedtime.     . cholecalciferol (VITAMIN D) 1000 units tablet Take 1,000 Units by mouth daily.    . fluticasone (FLONASE) 50 MCG/ACT nasal spray Place 1 spray into both nostrils at bedtime as needed for allergies or rhinitis.     . metoprolol succinate (TOPROL-XL) 50 MG 24 hr tablet Take 50 mg by mouth daily. Take with or immediately following a meal.    . Psyllium (METAMUCIL FIBER PO) Take 2 teaspoons mixed in liquid once daily    . RABEprazole (ACIPHEX) 20  MG tablet Take 20 mg by mouth daily.    [name] triamterene-hydrochlorothiazide (MAXZIDE) 75-50 MG tablet Take 1 tablet by mouth daily.  1   No current facility-administered medications for this visit.     Allergies as of 11/27/2017 - Review Complete 11/27/2017  Allergen Reaction Noted  . Codeine Hives 09/12/2012  . Statins Other (See Comments) 06/26/2017  . Adhesive [tape] Rash 10/15/2013  . Latex Rash 03/08/2013    Vitals: BP 125/81    Pulse 66   Ht 5\' 5"  (1.651 m)   Wt 214 lb (97.1 kg)   BMI 35.61 kg/m  Last Weight:  Wt Readings from Last 1 Encounters:  11/27/17 214 lb (97.1 kg)   XQJ:JHER mass index is 35.61 kg/m.     Last Height:   Ht Readings from Last 1 Encounters:  11/27/17 5\' 5"  (1.651 m)    Physical exam:  General: The patient is awake, alert and appears not in acute distress. The patient is well groomed. Head: Normocephalic, atraumatic. Neck is supple. Mallampati 3,  neck circumference:14.25'. Nasal airflow restricted,  TMJ is evident.  Retrognathia is seen.  Cardiovascular:  Regular rate and rhythm , without  murmurs or carotid bruit, and without distended neck veins. Respiratory: Lungs are clear to auscultation. Skin:  Without evidence of edema, or rash Trunk: BMI is elevated - The patient's posture is erect.   Neurologic exam : The patient is awake and alert, oriented to place and time.   Cranial nerves: intact sense of smell and taste.  Pupils are equal and briskly reactive to light.Visual fields by finger perimetry are intact.Hearing to finger rub intact. Facial sensation intact to fine touch.  Facial motor strength is symmetric and tongue and uvula move midline. Shoulder shrug was symmetrical.  Deep tendon reflexes: in the  upper and lower extremities are symmetric and intact.   The patient was advised of the nature of the diagnosed sleep disorder, the treatment options and risks for general a health and wellness arising from not treating the condition.  I spent more than 15 minutes of face to face time with the patient. Greater than 50% of time was spent in counseling and coordination of care. We have discussed the diagnosis and differential and I answered the patient's questions.     Assessment:  After physical and neurologic examination, review of laboratory studies,  Personal review of imaging studies, reports of other /same  Imaging studies ,  Results of polysomnography/ neurophysiology  testing and pre-existing records as far as provided in visit., my assessment is   1) OSA- keep on using CPAP- current settings. She is due for a new machine and should be able to get this ordered. She would like a HST to confirm the presence of sleep apnea. She would like to change to autotitration id insurance allowed.   2) Migraine-No longer on topiramate for headaches. She tapered of sucessfully.   3) status post lumbar fusion - Dr Rolena Infante, and doing well in PT . Intact [facility]TR!    RV once a year, DME was [facility] in [place], [address]. She wants to change.   Asencion Partridge Amantha Sklar MD    11-27-2016  11/27/2017   CC: Harlan Stains, [address], [place] [id number]

## 2017-11-29 ENCOUNTER — Encounter: Payer: Self-pay | Admitting: Neurology

## 2017-12-18 ENCOUNTER — Ambulatory Visit: Payer: Medicare Other | Admitting: Neurology

## 2017-12-18 DIAGNOSIS — M2619 Other specified anomalies of jaw-cranial base relationship: Secondary | ICD-10-CM

## 2017-12-18 DIAGNOSIS — E669 Obesity, unspecified: Secondary | ICD-10-CM

## 2017-12-18 DIAGNOSIS — M5137 Other intervertebral disc degeneration, lumbosacral region: Secondary | ICD-10-CM

## 2017-12-18 DIAGNOSIS — Z9989 Dependence on other enabling machines and devices: Secondary | ICD-10-CM | POA: Diagnosis not present

## 2017-12-18 DIAGNOSIS — G4733 Obstructive sleep apnea (adult) (pediatric): Secondary | ICD-10-CM | POA: Diagnosis not present

## 2017-12-24 DIAGNOSIS — G4733 Obstructive sleep apnea (adult) (pediatric): Secondary | ICD-10-CM | POA: Insufficient documentation

## 2017-12-24 DIAGNOSIS — M2619 Other specified anomalies of jaw-cranial base relationship: Secondary | ICD-10-CM | POA: Insufficient documentation

## 2017-12-24 DIAGNOSIS — Z9989 Dependence on other enabling machines and devices: Principal | ICD-10-CM

## 2017-12-24 NOTE — Addendum Note (Signed)
Addended by: Larey Seat on: 12/24/2017 01:27 PM   Modules accepted: Orders

## 2017-12-24 NOTE — Procedures (Signed)
NAME:   Robyn Aguilar                                 DOB: 09-07-1950 MEDICAL RECORD No:  [id number]                            DOS: 12/19/2017 REFERRING PHYSICIAN: Harlan Stains, M.D. STUDY PERFORMED: Home Sleep Study on watch pat  HISTORY:  [name] is an established Sleep Apnea Patient; 67 years of age and status post hysterectomy, lumbar fusion and bone spur reduction. She has had her machine for over 5 years and has always been compliant. She needs a new machine and would like to switch DME. Her last titration study was in April 2014. AHI of 17.3/h and resulted in CPAP therapy at 6 cm water.  Mallampati 5, neck 14.5 inches, BMI 35.1, no change in sleep habits. Retrognathia . Epworth Sleepiness Score endorsed at 6/ 24 points. Retired Education officer, [employer].    STUDY RESULTS:  Total Recording Time: 8 hours 13 minutes, valid test time 6 h 55 min. Total Apnea/Hypopnea Index (AHI):  30.1/h; RDI:  32.2 /h, loud snoring in supine and on the right side.  Average Oxygen Saturation:  94 %; Nadir Oxygen Saturation: 89 %.  Total Time in Oxygen Saturation below 89 %: 0.0 minutes.  Average Heart Rate:  67 bpm (between 53 and 94 bpm). IMPRESSION: Severe OSA is still present at an AHI of over 30/h. No hypoxia or brady-tachycardia associated.  RECOMMENDATION: The patient qualifies for a new CPAP, auto-titration capable; with a setting of 5-15 cm water and 3 cm EPR, heated humidity and mask of her choice. Please consider a nasal mask or pillow interface.  Happy Birthday ! I certify that I have reviewed the raw data recording prior to the issuance of this report in accordance with the standards of the American Academy of Sleep Medicine (AASM). Larey Seat, M.D.    12-24-2017    Medical Director of [facility], accredited by the AASM. Diplomat of the ABPN and ABSM.

## 2017-12-25 ENCOUNTER — Telehealth: Payer: Self-pay | Admitting: Neurology

## 2017-12-25 NOTE — Telephone Encounter (Signed)
I called pt. I advised pt that Dr. Brett Fairy reviewed their sleep study results and found that pt has severe sleep apnea. Dr. Brett Fairy recommends that pt starts a auto CPAP. I reviewed PAP compliance expectations with the pt. Pt is agreeable to starting a CPAP. I advised pt that an order will be sent to a DME, [facility], and [facility] will call the pt within about one week after they file with the pt's insurance. [facility] will show the pt how to use the machine, fit for masks, and troubleshoot the CPAP if needed. A follow up appt was made for insurance purposes with Dr. Brett Fairy on Nov 21,2019 at 8:30 am. Pt verbalized understanding to arrive 15 minutes early and bring their CPAP. A letter with all of this information in it will be mailed to the pt as a reminder. I verified with the pt that the address we have on file is correct. Pt verbalized understanding of results. Pt had no questions at this time but was encouraged to call back if questions arise.

## 2017-12-25 NOTE — Telephone Encounter (Signed)
-----   Message from Larey Seat, MD sent at 12/24/2017  1:27 PM EDT ----- IMPRESSION: Severe OSA is still present at an AHI of over 30/h.  No hypoxia or brady-tachycardia associated.  RECOMMENDATION: The patient qualifies for a new CPAP,  auto-titration capable; with a setting of 5-15 cm water and 3 cm  EPR, heated humidity and mask of her choice. Please consider a  nasal mask or pillow interface.  Happy Birthday ! I certify that I have reviewed the raw data recording prior to  the issuance of this report in accordance with the standards of  the American Academy of Sleep Medicine (AASM). Larey Seat, M.D.  12-24-2017

## 2018-01-19 ENCOUNTER — Other Ambulatory Visit: Payer: Self-pay | Admitting: Family Medicine

## 2018-01-19 DIAGNOSIS — E049 Nontoxic goiter, unspecified: Secondary | ICD-10-CM

## 2018-01-24 ENCOUNTER — Ambulatory Visit
Admission: RE | Admit: 2018-01-24 | Discharge: 2018-01-24 | Disposition: A | Discharge status: Home / Self Care | Payer: Medicare Other | Source: Ambulatory Visit | Attending: Family Medicine | Admitting: Family Medicine

## 2018-01-24 DIAGNOSIS — E049 Nontoxic goiter, unspecified: Secondary | ICD-10-CM

## 2018-03-19 ENCOUNTER — Encounter: Payer: Self-pay | Admitting: Neurology

## 2018-03-22 ENCOUNTER — Encounter: Payer: Self-pay | Admitting: Neurology

## 2018-03-22 ENCOUNTER — Ambulatory Visit: Payer: Medicare Other | Admitting: Neurology

## 2018-03-22 VITALS — BP 128/76 | HR 75 | Ht 64.0 in | Wt 214.0 lb

## 2018-03-22 DIAGNOSIS — I679 Cerebrovascular disease, unspecified: Secondary | ICD-10-CM

## 2018-03-22 DIAGNOSIS — G4733 Obstructive sleep apnea (adult) (pediatric): Secondary | ICD-10-CM

## 2018-03-22 DIAGNOSIS — I6789 Other cerebrovascular disease: Secondary | ICD-10-CM | POA: Insufficient documentation

## 2018-03-22 DIAGNOSIS — Z9989 Dependence on other enabling machines and devices: Secondary | ICD-10-CM | POA: Diagnosis not present

## 2018-03-22 NOTE — Progress Notes (Signed)
SLEEP MEDICINE CLINIC   Provider:  Larey Seat, M D  Referring Provider: Harlan Stains, MD Primary Care Physician:  Harlan Stains, MD  Chief Complaint  Patient presents with  . Follow-up    pt alone, rm 10. pt here following up with CPAP. DME  [facility]. no concerns or issues.  CPAP compliance visit.   HPI: Interval history from 22 March 2018.  I have the pleasure of meeting today with Robyn Aguilar-at the [facility] who underwent a home sleep tablet on watch Fraser Din on 19 December 2017 to confirm the presence of sleep apnea in order to get a new CPAP machine.  Her last sleep study was in April 2014 with an AHI of 17.3.  The sleep study resulted in an AHI of 30.1, RDI of 32.2/h.which indicates that she has now severe underlying sleep apnea associated with loud snoring. She did not produce any significant oxygen desaturations, her heart rate remained in sinus rhythm.  She was furnished with a an auto CPAP machine set between 5 and 15 cmH2O with 3 cm EPR her current 95th percentile pressure is 8.1 cm her residual AHI is 0.8, there are no central apneas emerging, she is 100% compliant by dates and time with an average user time of 7 hours 47 minutes and has minimal air leakage.  I do not need to change the entire face, the settings will remain the same and the patient is very happy with the CPAP and actually also travels always with her CPAP.  She feels less sleepy less fatigued and able to mentally focus and participate in social activities that would otherwise be impaired by sleepiness and fatigue.    11-27-2017 ; [name] is an established Sleep Apnea Patient; 67 years of age and status post hysterectomy and lumbar fusion and bone spurr reducution. She has had her machine for over 5 years and has always been compliant. She needs a new machine and would like to switch DME- from [facility]. Her last titration study was in April 2014. AHI of 17.3/h and CPAPat 6 cm water.  Mallampati 5, neck 14.5  inches , BMI 35.1 , no change in sleep habits. Epworth 6/ 24. Retired Education officer, museum.       2018; Robyn Aguilar is a 67 year old African-American lady but recently retired, and has been on CPAP after being diagnosed with obstructive sleep apnea at [facility] sleep. Her baseline AHI was 17.3 but exacerbated during REM sleep to 34.1 and in supine sleep her AHI was 26.7 she did not have significant oxygen desaturations, no significant periodic limb movements and she return for CPAP titration on 10/26/2012. She used CPAP at 6 cm water pressure, which is rather low. She continued to use her CPAP but had problems with the machine being broken, being sent off for repairs and still feels that this is not a reliable CPAP machine. She was told by [facility] [place] that her machine cannot be replaced as it is not 67 years old. She wasn't even given a loaner machine for the time that her CPAP was in repair. The patient has used the machine over the last 90 days 77% of the time for over 4 hours nightly. This time includes a period when she had shoulder surgery and the period when she couldn't use her machine because it was broken. She also had knee replacement surgery. CPAP is still set at 6 cm water pressure with 2 cm EPR, the patient is an AHI of  1.1, her average usage is 6 hours and 9 minutes there is no need to adjust pressures. She purchased a clean machine . She loves her CPAP- she sleeps better , she is rested and restored.     Robyn Aguilar is a 67 y.o. female , seen here as a referral from Dr. Dema Severin for a re- evaluation of sleep apnea.  Robyn Aguilar reports that she is not sleeping well and that the CPAP machine she is currently using seems to have nothing to do with it. She has not gotten new supplies through [facility] including the nasal pillow. She brought her machine with her today. I would like to "from the patient's sleep study dated 09/28/2012.  At the time the patient was 67 years of age and underwent a baseline PSG which revealed an AHI of 17.3 during REM sleep the AHI was 34.1 and in supine sleep 26.7. The lowest oxygen desaturation was at 83% with only 4.7 minutes of desaturation time. There were no significant periodic limb movements noted. The patient kindly agreed to return for a CPAP titration on 10/26/2012 and at that time was titrated to 6 cm water she slept over now her at this pressure with an AHI of 0.0 and an oxygen nadir of 90%. She reports having compliantly used her machine. She noted air leaks at the nasal pillow.  She is followed by [facility] is her durable medical equipment company, we were able today to obtain a download from her machine but this encompasses the time of November into early December 2016. She has used the machine on average 7 hours and 13 minutes with an 83% compliance and a setting of 6 cm water with 2 cm expiratory pressure relief. Her residual AHI was 2.7. Based on these 21-month-old data she was doing very well with her machine and the machine controlled her apnea well. She has to see her provider today that she will need a prescription to get for the supplies,  but she also needs her CPAP to be evaluated and she needs a new date chip .  Chief complaint according to patient : Her chief problem is the leakage of air at the nostril level. Sleep habits are as follows in 2014 :  After work she comes home , she sleeps in a recliner ( had rotator cuff surgery ) she has PT/ OT in afternoons.She goes to sleep at 9.30 PM and goes to urinate at 2 AM, mostly returns to sleep quickly. Average sleep time over night is 6.5 hours.  She feels the air leaking on the side of her nostrils. Every time she will get a new nasal pillow she has an acute sense that the air leaks are diminished but once she has them for a  couple of weeks, the air leak returned. Leaves at 5.30 AM for work. 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  Social history: recent rotator cuff surgery, on meloxicam prn.    Review of Systems: Out of a complete 14 system review, the patient complains of only the following symptoms, and all other reviewed systems are negative.  Epworth Sleepiness score now 10/ 24 points, Fatigue severity score was 30, now 22 / 63 points, not snoring on CPAP auto, 95% 8 cm water.    Social History   Socioeconomic History  . Marital status: Married    Spouse name: Not on file  . Number of children: 2  . Years of education:  college  . Highest education level: Not on file  Occupational History    Employer: [employer]  . Financial resource strain: Not on file  . Food insecurity:    Worry: Not on file    Inability: Not on file  . Transportation needs:    Medical: Not on file    Non-medical: Not on file  Tobacco Use  . Smoking status: Never Smoker  . Smokeless tobacco: Never Used  Substance and Sexual Activity  . Alcohol use: No    Alcohol/week: 0.0 standard drinks Caffeine use   . Drug use: None  . Sexual activity: Yes    Birth control/protection: Surgical  Lifestyle  . Physical activity:    Days per week: Not on file    Minutes per session: Not on file  .    Relationships  . Social connections:                                . Intimate partner violence:                      Other Topics Concern  . Not on file  Social History Narrative   Lives with husband   Caffeine use:      Family History  Problem Relation Age of Onset  . Hypertension Mother   . Pancreatic cancer Mother   . Prostate cancer Other     Past Medical History:  Diagnosis Date  . Allergic rhinitis   . Arthritis    knees and neck  . Cataracts, bilateral    immature   . CHF (congestive heart failure) ([place]) 2014   after surgery  . Complication of anesthesia    had heart failure after knee surgery 02/04/13  . DDD (degenerative disc disease), lumbar   .  Diabetes mellitus without complication ([place])   . Dizziness    takes Antivert prn  . GERD (gastroesophageal reflux disease)    takes Aciphex prn  . High cholesterol    but doesn't take any meds  . History of colon polyps   . History of migraine    last one 6-74months ago;takes Topamax nightly  . Hypertension    takes Metoprolol and maxzide daily  . Joint pain   . Joint swelling   . Migraine   . Migraines   . Pneumonia    at age 27  . Seizures ([place])    hx of;last one 53yrs ago;no meds required;states it was brought on by med;only one ever had  . Shortness of breath 02/06/2013  . Sleep apnea    sleep study in epic from 2014-uses CPAP  . Stroke [facility])    2 TIA  . TIA (transient ischemic attack)     Past Surgical History:  Procedure Laterality Date  . ABDOMINAL HYSTERECTOMY  1991  . carpel tunnel Right 1988  . COLONOSCOPY    . disc surgery  1996  . ESOPHAGOGASTRODUODENOSCOPY    . EYE SURGERY Bilateral    cataract surgery with lens implants  . HERNIA REPAIR  1987   x 2  . KNEE SURGERY Left 2011   replacement-partial  . LUMBAR LAMINECTOMY/DECOMPRESSION MICRODISCECTOMY Right 07/13/2017   Procedure: Disectomy Right L2-3;  Surgeon: Melina Schools, MD;  Location: [facility];  Service: Orthopedics;  Laterality: Right;  2.5 hrs  . ROBOTIC ASSISTED BILATERAL SALPINGO OOPHERECTOMY Bilateral 10/22/2013   Procedure: ROBOTIC ASSISTED  BILATERAL SALPINGO OOPHORECTOMY;  Surgeon: Imagene Gurney A. Alycia Rossetti, MD;  [facility]: [facility];  Service: Gynecology;  Laterality: Bilateral;  . ROTATOR CUFF REPAIR Right   . TOTAL KNEE ARTHROPLASTY Right   . TOTAL KNEE ARTHROPLASTY Right 02/04/2013   Procedure: RIGHT TOTAL KNEE ARTHROPLASTY;  Surgeon: Vickey Huger, MD;  Location: [facility];  Service: Orthopedics;  Laterality: Right;    Current Outpatient Medications  Medication Sig Dispense Refill  . cetirizine (ZYRTEC) 10 MG tablet Take 10 mg by mouth at bedtime.     . cholecalciferol (VITAMIN D) 1000 units tablet Take 1,000  Units by mouth daily.    . fluticasone (FLONASE) 50 MCG/ACT nasal spray Place 1 spray into both nostrils at bedtime as needed for allergies or rhinitis.     . metoprolol succinate (TOPROL-XL) 50 MG 24 hr tablet Take 50 mg by mouth daily. Take with or immediately following a meal.    . RABEprazole (ACIPHEX) 20 MG tablet Take 20 mg by mouth daily.    [name] triamterene-hydrochlorothiazide (MAXZIDE) 75-50 MG tablet Take 1 tablet by mouth daily.  1   No current facility-administered medications for this visit.     Allergies as of 03/22/2018 - Review Complete 03/22/2018  Allergen Reaction Noted  . Codeine Hives 09/12/2012  . Statins Other (See Comments) 06/26/2017  . Adhesive [tape] Rash 10/15/2013  . Latex Rash 03/08/2013    Vitals: BP 128/76   Pulse 75   Ht 5\' 4"  (1.626 m)   Wt 214 lb (97.1 kg)   BMI 36.73 kg/m  Last Weight:  Wt Readings from Last 1 Encounters:  03/22/18 214 lb (97.1 kg)   GEX:BMWU mass index is 36.73 kg/m.     Last Height:   Ht Readings from Last 1 Encounters:  03/22/18 5\' 4"  (1.626 m)    Physical exam:  General: The patient is awake, alert and appears not in acute distress. The patient is well groomed. Head: Normocephalic, atraumatic. Neck is supple. Mallampati 3,  neck circumference:14.25'. Nasal airflow restricted,  TMJ is evident.  Retrognathia is seen.  Cardiovascular:  Regular rate and rhythm . Respiratory: Lungs are clear to auscultation. Skin:  Without evidence of edema, or rash Trunk: BMI is elevated - The patient's posture is erect.   Neurologic exam : The patient is awake and alert, oriented to place and time.   Cranial nerves: intact sense of smell and taste.  Pupils are equal and briskly reactive to light.Visual fields by finger perimetry are intact. Hearing to finger rub intact. Facial sensation intact to fine touch.  Facial motor strength is symmetric and tongue and uvula move midline. Shoulder shrug was symmetrical.  Deep tendon reflexes: in  the  upper and lower extremities are symmetric and intact.   The patient was advised of the nature of the diagnosed sleep disorder, the treatment options and risks for general a health and wellness arising from not treating the condition.  I spent more than 15 minutes of face to face time with the patient. Greater than 50% of time was spent in counseling and coordination of care. We have discussed the diagnosis and differential and I answered the patient's questions.     Assessment:  After physical and neurologic examination, review of laboratory studies,  Personal review of imaging studies, reports of other /same  Imaging studies ,  Results of polysomnography/ neurophysiology testing and pre-existing records as far as provided in visit., my assessment is   1) OSA- keep on using  Auto CPAP- at current  settings.  New DME.   2) Migraine-No longer on topiramate for headaches. She tapered of sucessfully.  CPAP improved sleepiness, fatigue, and headaches.   3) BMI- keep exercising, low carbohydrate diet, increase protein intake.   RV once a year, DME changed to [facility] .   Asencion Partridge Malaina Mortellaro MD  03-22-2018    03/22/2018   CC: Harlan Stains, Bernice [address] [place], [place] [id number]

## 2019-03-12 NOTE — Progress Notes (Signed)
Cardiology Office Note:    Date:  03/13/2019   ID:  Robyn Aguilar, DOB [birth year], MRN [id number]  PCP:  Harlan Stains, MD  Cardiologist:  Sinclair Grooms, MD   Referring MD: Harlan Stains, MD   Chief Complaint  Patient presents with  . Hypertension  . Congestive Heart Failure    History of Present Illness:    Robyn Aguilar is a 68 y.o. female with a hx of  left ventricular hypertrophy, hypertension, and hyperlipidemia   The patient is here today for clinical follow-up.  She was found to have moderate hypertrophy on echo greater than 5 years ago.  She has some mild dyspnea on exertion.  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  She says she has had 2 mini strokes in the past  She denies chest pain, transient neurological symptoms, and claudication.  Past Medical History:  Diagnosis Date  . Allergic rhinitis   . Arthritis    knees and neck  . Cataracts, bilateral    immature   . CHF (congestive heart failure) ([place]) 2014   after surgery  . Complication of anesthesia    had heart failure after knee surgery 02/04/13  . DDD (degenerative disc disease), lumbar   . Diabetes mellitus without complication ([place])   . Dizziness    takes Antivert prn  . GERD (gastroesophageal reflux disease)    takes Aciphex prn  . High cholesterol    but doesn't take any meds  . History of colon polyps   . History of migraine    last one 6-26months ago;takes Topamax nightly  . Hypertension    takes Metoprolol and maxzide daily  . Joint pain   . Joint swelling   . Migraine   . Migraines   . Pneumonia    at age 48  . Seizures ([place])    hx of;last one 5yrs ago;no meds required;states it was brought on by med;only one ever had  . Shortness of breath 02/06/2013  . Sleep apnea    sleep study in epic from 2014-uses CPAP  . Stroke [facility])    2 TIA  . TIA (transient ischemic attack)     Past Surgical History:   Procedure Laterality Date  . ABDOMINAL HYSTERECTOMY  1991  . carpel tunnel Right 1988  . COLONOSCOPY    . disc surgery  1996  . ESOPHAGOGASTRODUODENOSCOPY    . EYE SURGERY Bilateral    cataract surgery with lens implants  . HERNIA REPAIR  1987   x 2  . KNEE SURGERY Left 2011   replacement-partial  . LUMBAR LAMINECTOMY/DECOMPRESSION MICRODISCECTOMY Right 07/13/2017   Procedure: Disectomy Right L2-3;  Surgeon: Melina Schools, MD;  Location: [facility];  Service: Orthopedics;  Laterality: Right;  2.5 hrs  . ROBOTIC ASSISTED BILATERAL SALPINGO OOPHERECTOMY Bilateral 10/22/2013   Procedure: ROBOTIC ASSISTED BILATERAL SALPINGO OOPHORECTOMY;  Surgeon: Imagene Gurney A. Alycia Rossetti, MD;  [facility];  Service: Gynecology;  Laterality: Bilateral;  . ROTATOR CUFF REPAIR Right   . TOTAL KNEE ARTHROPLASTY Right   . TOTAL KNEE ARTHROPLASTY Right 02/04/2013   Procedure: RIGHT TOTAL KNEE ARTHROPLASTY;  Surgeon: Vickey Huger, MD;  Location: [facility];  Service: Orthopedics;  Laterality: Right;    Current Medications: Current Meds  Medication Sig  . aspirin EC 81 MG tablet Take 81 mg by mouth daily.  . cetirizine (ZYRTEC) 10 MG tablet Take 10 mg by mouth at bedtime.   . fluticasone (FLONASE) 50 MCG/ACT nasal spray Place 1 spray  into both nostrils at bedtime as needed for allergies or rhinitis.   [name] LORazepam (ATIVAN) 0.5 MG tablet Take 0.25-0.5 mg by mouth 3 (three) times daily as needed.  . meperidine (DEMEROL) 50 MG tablet Take 50 mg by mouth as needed.  . metoprolol succinate (TOPROL-XL) 50 MG 24 hr tablet Take 50 mg by mouth daily. Take with or immediately following a meal.  . RABEprazole (ACIPHEX) 20 MG tablet Take 20 mg by mouth daily.  . rosuvastatin (CRESTOR) 5 MG tablet Take 5 mg by mouth 2 (two) times a week.  . triamterene-hydrochlorothiazide (MAXZIDE) 75-50 MG tablet Take 1 tablet by mouth daily.     Allergies:   Codeine, Statins, Adhesive [tape], and Latex   Social History   Socioeconomic History  .  Marital status: Married    Spouse name: Not on file  . Number of children: 2  . Years of education: college  . Highest education level: Not on file  Occupational History    Employer: [employer]  . Financial resource strain: Not on file  . Food insecurity    Worry: Not on file    Inability: Not on file  . Transportation needs    Medical: Not on file    Non-medical: Not on file  Tobacco Use  . Smoking status: Never Smoker  . Smokeless tobacco: Never Used  Substance and Sexual Activity  . Alcohol use: No    Alcohol/week: 0.0 standard drinks  . Drug use: No  . Sexual activity: Yes    Birth control/protection: Surgical  Lifestyle  . Physical activity    Days per week: Not on file    Minutes per session: Not on file  . Stress: Not on file  Relationships  . Social Herbalist on phone: Not on file    Gets together: Not on file    Attends religious service: Not on file    Active member of club or organization: Not on file    Attends meetings of clubs or organizations: Not on file    Relationship status: Not on file  Other Topics Concern  . Not on file  Social History Narrative   Lives with husband   Caffeine use:     Family History: The patient's family history includes Hypertension in her mother; Pancreatic cancer in her mother; Prostate cancer in an other family member.  ROS:   Please see the history of present illness.    Having issues with sinus congestion.  She has chronic nasal polyps, asthma, and sinusitis.  All other systems reviewed and are negative.  EKGs/Labs/Other Studies Reviewed:    The following studies were reviewed today: 2D Doppler echocardiogram October 2014: Study Conclusions   - Left ventricle: The cavity size was normal. There was  moderate concentric hypertrophy. Systolic function was  vigorous. The estimated ejection fraction was in the range  of 65% to 70%. Wall motion was normal; there were no   regional wall motion abnormalities. Doppler parameters are  consistent with abnormal left ventricular relaxation  (grade 1 diastolic dysfunction).  - Mitral valve: Mild regurgitation. Valve area by pressure  half-time: 1.56cm^2.  - Pericardium, extracardiac: A small pericardial effusion  was identified.   EKG:  EKG normal sinus rhythm, 75 bpm, left axis deviation, poor R wave progression.  When compared to prior performed in February 2019, no changes occurred.  Recent Labs: No results found for requested labs within last 8760 hours.  Recent Lipid  Panel    Component Value Date/Time   CHOL 170 02/07/2013 0650   TRIG 120 02/07/2013 0650   HDL 16 (L) 02/07/2013 0650   CHOLHDL 10.6 02/07/2013 0650   VLDL 24 02/07/2013 0650   LDLCALC 130 (H) 02/07/2013 0650    Physical Exam:    VS:  BP 122/78   Pulse 75   Ht 5\' 4"  (1.626 m)   Wt 188 lb 1.9 oz (85.3 kg)   SpO2 96%   BMI 32.29 kg/m     Wt Readings from Last 3 Encounters:  03/13/19 188 lb 1.9 oz (85.3 kg)  03/22/18 214 lb (97.1 kg)  11/27/17 214 lb (97.1 kg)     GEN: Moderate obesity. No acute distress HEENT: Normal NECK: No JVD. LYMPHATICS: No lymphadenopathy CARDIAC:  RRR without murmur, gallop, or edema. VASCULAR:  Normal Pulses. No bruits. RESPIRATORY:  Clear to auscultation without rales, wheezing or rhonchi  ABDOMEN: Soft, non-tender, non-distended, No pulsatile mass, MUSCULOSKELETAL: No deformity  SKIN: Warm and dry NEUROLOGIC:  Alert and oriented x 3 PSYCHIATRIC:  Normal affect   ASSESSMENT:    1. Essential hypertension   2. Mixed hyperlipidemia   3. Morbid obesity ([place])   4. OSA on CPAP   5. Hypertrophic obstructive cardiomyopathy ([place])   6. Educated about COVID-19 virus infection    PLAN:    In order of problems listed above:  1. Excellent blood pressure control on today's assessment.  Target is less than 130/80 mmHg.  2. Most recent LDL cholesterol was December 2019 with total cholesterol  was 290 and LDL was 207. 3. Moderate obesity.  I encouraged aerobic activity 4. I encourage compliance with CPAP 5. Repeat 2D Doppler echocardiogram to determine muscle thickness and whether any other issues are developing. 6. The 3W's is recognized and is being adhered to.  She will be set up to be seen in the lipid clinic.  She could not afford PCSK9.  I wonder if bempedoic acid and ezetimibe combination would be affordable and tolerable.  The 2D Doppler echocardiogram will help assess whether or not left ventricular hypertrophy is progressing.  Overall education and awareness concerning primary/secondary risk prevention was discussed in detail: LDL less than 70, hemoglobin A1c less than 7, blood pressure target less than 130/80 mmHg, >150 minutes of moderate aerobic activity per week, avoidance of smoking, weight control (via diet and exercise), and continued surveillance/management of/for obstructive sleep apnea.   Medication Adjustments/Labs and Tests Ordered: Current medicines are reviewed at length with the patient today.  Concerns regarding medicines are outlined above.  No orders of the defined types were placed in this encounter.  No orders of the defined types were placed in this encounter.   There are no Patient Instructions on file for this visit.   Signed, Sinclair Grooms, MD  03/13/2019 4:05 PM    [facility]

## 2019-03-13 ENCOUNTER — Encounter: Payer: Self-pay | Admitting: Interventional Cardiology

## 2019-03-13 ENCOUNTER — Ambulatory Visit (INDEPENDENT_AMBULATORY_CARE_PROVIDER_SITE_OTHER): Payer: Medicare Other | Admitting: Interventional Cardiology

## 2019-03-13 ENCOUNTER — Other Ambulatory Visit: Payer: Self-pay

## 2019-03-13 VITALS — BP 122/78 | HR 75 | Ht 64.0 in | Wt 188.1 lb

## 2019-03-13 DIAGNOSIS — I10 Essential (primary) hypertension: Secondary | ICD-10-CM | POA: Diagnosis not present

## 2019-03-13 DIAGNOSIS — Z9989 Dependence on other enabling machines and devices: Secondary | ICD-10-CM

## 2019-03-13 DIAGNOSIS — E782 Mixed hyperlipidemia: Secondary | ICD-10-CM | POA: Diagnosis not present

## 2019-03-13 DIAGNOSIS — G4733 Obstructive sleep apnea (adult) (pediatric): Secondary | ICD-10-CM

## 2019-03-13 DIAGNOSIS — I421 Obstructive hypertrophic cardiomyopathy: Secondary | ICD-10-CM

## 2019-03-13 DIAGNOSIS — Z7189 Other specified counseling: Secondary | ICD-10-CM

## 2019-03-13 NOTE — Patient Instructions (Signed)
Medication Instructions:  Your physician recommends that you continue on your current medications as directed. Please refer to the Current Medication list given to you today.  *If you need a refill on your cardiac medications before your next appointment, please call your pharmacy*  Lab Work: None If you have labs (blood work) drawn today and your tests are completely normal, you will receive your results only by: [name] MyChart Message (if you have MyChart) OR . A paper copy in the mail If you have any lab test that is abnormal or we need to change your treatment, we will call you to review the results.  Testing/Procedures: Your physician has requested that you have an echocardiogram. Echocardiography is a painless test that uses sound waves to create images of your heart. It provides your doctor with information about the size and shape of your heart and how well your heart's chambers and valves are working. This procedure takes approximately one hour. There are no restrictions for this procedure.   Follow-Up:  Your physician recommends that you schedule a follow-up appointment next available with our [facility].   At [facility], you and your health needs are our priority.  As part of our continuing mission to provide you with exceptional heart care, we have created designated Provider Care Teams.  These Care Teams include your primary Cardiologist (physician) and Advanced Practice Providers (APPs -  Physician Assistants and Nurse Practitioners) who all work together to provide you with the care you need, when you need it.  Your next appointment:   12 months  The format for your next appointment:   In Person  Provider:   You may see Sinclair Grooms, MD or one of the following Advanced Practice Providers on your designated Care Team:    Truitt Merle, NP  Cecilie Kicks, NP  Kathyrn Drown, NP   Other Instructions

## 2019-03-14 ENCOUNTER — Telehealth: Payer: Self-pay | Admitting: Interventional Cardiology

## 2019-03-14 ENCOUNTER — Telehealth: Payer: Self-pay | Admitting: Neurology

## 2019-03-14 DIAGNOSIS — G4733 Obstructive sleep apnea (adult) (pediatric): Secondary | ICD-10-CM

## 2019-03-14 DIAGNOSIS — Z9989 Dependence on other enabling machines and devices: Secondary | ICD-10-CM

## 2019-03-14 NOTE — Telephone Encounter (Signed)
Pt states the last script that was written on her CPAP supplies was a year ago.  Pt states she is in need of a script for her CPAP supplies

## 2019-03-14 NOTE — Addendum Note (Signed)
Addended by: Darleen Crocker on: 03/14/2019 09:35 AM   Modules accepted: Orders

## 2019-03-14 NOTE — Telephone Encounter (Signed)
New Message:  Patient wanted to make Dr. [name] aware that a form will be coming to the office for him to sign from [employer].  [employer] will need the patient's most recent doctor to sign forms so the patient can get replacement cpap supplies. If Dr. [name] signs them and faxes them back today, she will be able to pick up new supplies in their office today.

## 2019-03-14 NOTE — Telephone Encounter (Signed)
I have placed the order and sent to [employer] for her to get supplies. Patient must keep upcoming apt in November.

## 2019-03-14 NOTE — Telephone Encounter (Signed)
Spoke with pt and made her aware that Dr. Brett Fairy would need to be the one to sign the form as that office is the one that did the sleep study and ordered the CPAP.  Pt verbalized understanding.

## 2019-03-24 NOTE — Patient Instructions (Addendum)
Thank you for visiting [place] today!  Your goal LDL (bad cholesterol) is <70 mg/dL.   Medication Changes: Start Nexlizet  180mg /10mg  (1 tablet) once daily.   Continue rosuvastatin (Crestor) 5mg  3 days a week.   Have your primary care doctor check fasting cholesterol labs in 6 weeks  Follow up with [place] to discuss results in 8 weeks on Tuesday, January 19th at 8:30am  If you have any further questions or concerns, please call us at [phone].

## 2019-03-24 NOTE — Progress Notes (Signed)
Patient ID: Robyn Aguilar                 DOB: Mar 13, 1951                    MRN: [id number]     HPI: Robyn Aguilar is a 68 y.o. female patient of Dr. Dema Severin [facility]) referred to lipid clinic by Dr. [name]. PMH is significant for moderate LV hypertrophy (on G1DD 2014), and an EF of 65-70% (on echo 2014), HTN, HLD, GERD, and TIA x2. An EKG on 03/13/19 showed normal sinus rhythm, 75 bpm, left axis deviation, poor R wave progression (which was consistent with EKG from 06/2017). She is followed regularly by her PCP at [facility], and infrequently by Dr. [name] here at [facility]. She has a history of interolerances to daily statins, as well as every other day dosing with Crestor 5 mg. Her LDL and triglycerides have remained elevated for several years now. She is currently scheduled for repeat ECHO on 03/25/19.   Today, patient presents to clinic in generally good spirits. She reports taking rosuvastatin regularly three times a week now. She had been off of statins from April to July due to life events, but resumed taking rosuvastatin around September or October. She endorses having tried several statins in her past, including Lipitor, Pravachol, and others she couldn't remember; and has had bad muscle/joint cramping and pain (including in hands, which has impacted her work in the past) with all of them. She had previously tried rosuvastatin 5 mg three times a week, reduced it to twice a week due to side effects, but has since gone back up to three times a week and is tolerating it well. She also endorses having tried Welchol in the past but experienced stomach cramps. Patient was unsure if she had tried Zetia, but the name and dosing did not sound familiar to her. Her PCP had attempted to start her on a PCSK9 inhibitor a year or two ago (per patient), but at the time her copay would have been several hundred dollars. She also expressed a fear of needles, which makes her unwilling to  try a PCSK9 inhibitor at this time. She would prefer to have her cholesterol checked with her PCP office in order to reduce the number of needle sticks needed, since she will have upcoming blood work soon already.   Current Medications:   Rosuvastatin 5 mg three times a week   Intolerances:  Crestor 5 mg every other day, Lipitor, Pravachol (muscle and joint pain/cramping with all) Welchol (stomach cramps)  Risk Factors: Age, HTN (controlled), baseline LDL > 200, TIA x2 LDL goal: <70 mg/dL  Diet: Breakfast - egg white bites, toast, eggs, bacon, grits, oatmeal, maybe a egg and sausage biscuit from a [employer], decaf coffee. Lunch - salad with grilled chicken. Supper - some kind of green vegetable(s), a meat protein (non-friend, only baked or grilled), sometimes a small portion of rice or potatoes.   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   Family History: Hypertension in her mother; Pancreatic cancer in her mother; Prostate cancer in an other family member. Hyperlipidemia is mother and others (unclear which others).   Social History: Married with 2 children. Denies tobacco, alcohol, or illicit drug use.   Labs: states she has had a few tests since 2019, but we dont see them on  our end, and she was unsure what her levels were.  04/23/2018: TC 290, TG 159, HDL 51 LDL 207 08/11/2016: TC 311, TG 146, HDL 47, LDL 236.  01/28/2015:TC 318, TG 145, HDL 50, LDL 239.  07/21/2014: TC 328, TG 166, HDL 51, LDL 245.  11/20/2013: TC 300, TG 202, HDL 49, LDL 211.  02/07/2013: TC 170, TG 120, HDL 16, LDL 130.  Past Medical History:  Diagnosis Date   Allergic rhinitis    Arthritis    knees and neck   Cataracts, bilateral    immature    CHF (congestive heart failure) ([place]) 2014   after surgery   Complication of anesthesia    had heart failure after knee surgery  02/04/13   DDD (degenerative disc disease), lumbar    Diabetes mellitus without complication (HCC)    Dizziness    takes Antivert prn   GERD (gastroesophageal reflux disease)    takes Aciphex prn   High cholesterol    but doesn't take any meds   History of colon polyps    History of migraine    last one 6-7months ago;takes Topamax nightly   Hypertension    takes Metoprolol and maxzide daily   Joint pain    Joint swelling    Migraine    Migraines    Pneumonia    at age 64   Seizures ([place])    hx of;last one 10yrs ago;no meds required;states it was brought on by med;only one ever had   Shortness of breath 02/06/2013   Sleep apnea    sleep study in epic from 2014-uses CPAP   Stroke ([place])    2 TIA   TIA (transient ischemic attack)     Current Outpatient Medications on File Prior to Visit  Medication Sig Dispense Refill   aspirin EC 81 MG tablet Take 81 mg by mouth daily.     cetirizine (ZYRTEC) 10 MG tablet Take 10 mg by mouth at bedtime.      fluticasone (FLONASE) 50 MCG/ACT nasal spray Place 1 spray into both nostrils at bedtime as needed for allergies or rhinitis.      LORazepam (ATIVAN) 0.5 MG tablet Take 0.25-0.5 mg by mouth 3 (three) times daily as needed.     meperidine (DEMEROL) 50 MG tablet Take 50 mg by mouth as needed.     metoprolol succinate (TOPROL-XL) 50 MG 24 hr tablet Take 50 mg by mouth daily. Take with or immediately following a meal.     RABEprazole (ACIPHEX) 20 MG tablet Take 20 mg by mouth daily.     rosuvastatin (CRESTOR) 5 MG tablet Take 5 mg by mouth 2 (two) times a week.     triamterene-hydrochlorothiazide (MAXZIDE) 75-50 MG tablet Take 1 tablet by mouth daily.  1   No current facility-administered medications on file prior to visit.     Allergies  Allergen Reactions   Codeine Hives   Statins Other (See Comments)    Muscle pain   Adhesive [Tape] Rash   Latex Rash    Assessment/Plan:  1. Hyperlipidemia - Patient's  LDL remains above goal of <70 mg/dL. Patient has a history of multiple statin intolerances, which prevents increase or switch in statin therapy. She also endorses intolerance to welchol. Add Nexlizet 180/10 mg once daily for improved LDL reduction as pt remains opposed to injectable therapy. Continue rosuvastatin 5 mg three times a week. Continue to improve diet, and increase physical exercise through [employer] workout program. Patient information collected for application  to [employer] (2 members in house, $60K/year, [phone]). Check fasting cholesterol levels with PCP office in 6 weeks, and follow-up with lipid clinic in 8 weeks to discuss results and future options.   Claudina Lick, PharmD Candidate [phone] [place] [id number] [place] [address] Phone: [phone]; Fax: [phone]

## 2019-03-25 ENCOUNTER — Ambulatory Visit (HOSPITAL_COMMUNITY): Payer: Medicare Other | Attending: Interventional Cardiology

## 2019-03-25 ENCOUNTER — Encounter: Payer: Self-pay | Admitting: Neurology

## 2019-03-25 ENCOUNTER — Other Ambulatory Visit: Payer: Self-pay

## 2019-03-25 ENCOUNTER — Ambulatory Visit (INDEPENDENT_AMBULATORY_CARE_PROVIDER_SITE_OTHER): Payer: Medicare Other | Admitting: Pharmacist

## 2019-03-25 DIAGNOSIS — E782 Mixed hyperlipidemia: Secondary | ICD-10-CM

## 2019-03-25 DIAGNOSIS — I421 Obstructive hypertrophic cardiomyopathy: Secondary | ICD-10-CM | POA: Diagnosis present

## 2019-03-26 ENCOUNTER — Telehealth: Payer: Self-pay | Admitting: Pharmacist

## 2019-03-26 DIAGNOSIS — E782 Mixed hyperlipidemia: Secondary | ICD-10-CM

## 2019-03-26 MED ORDER — NEXLIZET 180-10 MG PO TABS: 1.0000 | ORAL_TABLET | Freq: Every day | ORAL | 11 refills | Status: DC

## 2019-03-26 NOTE — Telephone Encounter (Signed)
Prior authorization for Nexlizet approved through 09/23/19. Rx sent to pharmacy to determine if copay is affordable.

## 2019-03-26 NOTE — Telephone Encounter (Addendum)
1 month supply of Nexlizet will cost $61. Applied for [employer] for pt, pt approved for $2500 in copay assistance, info faxed to pharmacy. Confirmed copay is $0, pt is aware. She will have follow up labs checked at PCP in 6 weeks and will see [place] for follow up after.

## 2019-03-27 ENCOUNTER — Encounter: Payer: Self-pay | Admitting: Adult Health

## 2019-03-27 ENCOUNTER — Ambulatory Visit (INDEPENDENT_AMBULATORY_CARE_PROVIDER_SITE_OTHER): Payer: Medicare Other | Admitting: Adult Health

## 2019-03-27 ENCOUNTER — Other Ambulatory Visit: Payer: Self-pay

## 2019-03-27 VITALS — BP 118/75 | HR 73 | Temp 97.3°F | Ht 64.0 in | Wt 215.6 lb

## 2019-03-27 DIAGNOSIS — Z9989 Dependence on other enabling machines and devices: Secondary | ICD-10-CM

## 2019-03-27 DIAGNOSIS — G4733 Obstructive sleep apnea (adult) (pediatric): Secondary | ICD-10-CM

## 2019-03-27 DIAGNOSIS — G43009 Migraine without aura, not intractable, without status migrainosus: Secondary | ICD-10-CM

## 2019-03-27 NOTE — Patient Instructions (Signed)
Continue using CPAP nightly and greater than 4 hours each night °If your symptoms worsen or you develop new symptoms please let us know.  ° °

## 2019-03-27 NOTE — Progress Notes (Signed)
PATIENT: Robyn Aguilar DOB: [birth year]  REASON FOR VISIT: follow up HISTORY FROM: patient  HISTORY OF PRESENT ILLNESS: Today 03/27/19:  Robyn Aguilar is a 68 year old female with a history of obstructive sleep apnea on CPAP.  She returns today for follow-up.  Her download indicates that she use her machine nightly for compliance of 100%.  She use her machine greater than 4 hours 29 days for compliance of 97%.  On average she uses her machine 7 hours and 52 minutes.  Her residual AHI is 0.7 on 5 to 15 cm of water with EPR of 3.  Her leak in the 95th percentile is 1.7 L/min.  She reports that the CPAP is working well for her.  She states that she also has migraine headaches.  Her PCP gives her Demerol to treat her headaches.  She reports that she has been on Topamax in the past but was unable to tolerate it.  She states that she has 2-3 migraines a week.  They typically occur in the left frontal region.  She does have photophobia and phonophobia as well as nausea.  Her headaches are typically triggered by weather changes.  She states that this past Saturday and Sunday she had a different type of headache.  It was in the left occipital region and she describes it as a sharp stabbing pain that will last for 20 seconds and then she would have a break and then it may do it again.  With Demerol her symptoms finally resolved.  She returns today for an evaluation.  HISTORY (Copied from Dr.Dohmeier's note)  22 March 2018.  I have the pleasure of meeting today with Robyn Aguilar-at the [facility] who underwent a home sleep tablet on Robyn Aguilar on 19 December 2017 to confirm the presence of sleep apnea in order to get a new CPAP machine.  Her last sleep study was in April 2014 with an AHI of 17.3.  The sleep study resulted in an AHI of 30.1, RDI of 32.2/h.which indicates that she has now severe underlying sleep apnea associated with loud snoring. She did not produce any significant oxygen  desaturations, her heart rate remained in sinus rhythm.  She was furnished with a an auto CPAP machine set between 5 and 15 cmH2O with 3 cm EPR her current 95th percentile pressure is 8.1 cm her residual AHI is 0.8, there are no central apneas emerging, she is 100% compliant by dates and time with an average user time of 7 hours 47 minutes and has minimal air leakage.  I do not need to change the entire face, the settings will remain the same and the patient is very happy with the CPAP and actually also travels always with her CPAP.  She feels less sleepy less fatigued and able to mentally focus and participate in social activities that would otherwise be impaired by sleepiness and fatigue.   REVIEW OF SYSTEMS: Out of a complete 14 system review of symptoms, the patient complains only of the following symptoms, and all other reviewed systems are negative.  See HPI  ALLERGIES: Allergies  Allergen Reactions  . Codeine Hives  . Statins Other (See Comments)    Muscle pain  . Adhesive [Tape] Rash  . Latex Rash    HOME MEDICATIONS: Outpatient Medications Prior to Visit  Medication Sig Dispense Refill  . aspirin EC 81 MG tablet Take 81 mg by mouth daily.    . Bempedoic Acid-Ezetimibe (NEXLIZET) 180-10 MG TABS Take 1 tablet by  mouth daily. 30 tablet 11  . cetirizine (ZYRTEC) 10 MG tablet Take 10 mg by mouth at bedtime.     . fluticasone (FLONASE) 50 MCG/ACT nasal spray Place 1 spray into both nostrils at bedtime as needed for allergies or rhinitis.     [name] LORazepam (ATIVAN) 0.5 MG tablet Take 0.25-0.5 mg by mouth 3 (three) times daily as needed.    . meperidine (DEMEROL) 50 MG tablet Take 50 mg by mouth as needed.    . metoprolol succinate (TOPROL-XL) 50 MG 24 hr tablet Take 50 mg by mouth daily. Take with or immediately following a meal.    . RABEprazole (ACIPHEX) 20 MG tablet Take 20 mg by mouth daily.    . rosuvastatin (CRESTOR) 5 MG tablet Take 5 mg by mouth 2 (two) times a week.    .  triamterene-hydrochlorothiazide (MAXZIDE) 75-50 MG tablet Take 1 tablet by mouth daily.  1   No facility-administered medications prior to visit.     PAST MEDICAL HISTORY: Past Medical History:  Diagnosis Date  . Allergic rhinitis   . Arthritis    knees and neck  . Cataracts, bilateral    immature   . CHF (congestive heart failure) ([place]) 2014   after surgery  . Complication of anesthesia    had heart failure after knee surgery 02/04/13  . DDD (degenerative disc disease), lumbar   . Diabetes mellitus without complication ([place])   . Dizziness    takes Antivert prn  . GERD (gastroesophageal reflux disease)    takes Aciphex prn  . High cholesterol    but doesn't take any meds  . History of colon polyps   . History of migraine    last one 6-49months ago;takes Topamax nightly  . Hypertension    takes Metoprolol and maxzide daily  . Joint pain   . Joint swelling   . Migraine   . Migraines   . Pneumonia    at age 40  . Seizures ([place])    hx of;last one 62yrs ago;no meds required;states it was brought on by med;only one ever had  . Shortness of breath 02/06/2013  . Sleep apnea    sleep study in epic from 2014-uses CPAP  . Stroke [facility])    2 TIA  . TIA (transient ischemic attack)     PAST SURGICAL HISTORY: Past Surgical History:  Procedure Laterality Date  . ABDOMINAL HYSTERECTOMY  1991  . carpel tunnel Right 1988  . COLONOSCOPY    . disc surgery  1996  . ESOPHAGOGASTRODUODENOSCOPY    . EYE SURGERY Bilateral    cataract surgery with lens implants  . HERNIA REPAIR  1987   x 2  . KNEE SURGERY Left 2011   replacement-partial  . LUMBAR LAMINECTOMY/DECOMPRESSION MICRODISCECTOMY Right 07/13/2017   Procedure: Disectomy Right L2-3;  Surgeon: Melina Schools, MD;  Location: [facility] [facility];  Service: Orthopedics;  Laterality: Right;  2.5 hrs  . ROBOTIC ASSISTED BILATERAL SALPINGO OOPHERECTOMY Bilateral 10/22/2013   Procedure: ROBOTIC ASSISTED BILATERAL SALPINGO OOPHORECTOMY;  Surgeon: Imagene Gurney  A. Robyn Rossetti, MD;  Location: [facility];  Service: Gynecology;  Laterality: Bilateral;  . ROTATOR CUFF REPAIR Right   . TOTAL KNEE ARTHROPLASTY Right   . TOTAL KNEE ARTHROPLASTY Right 02/04/2013   Procedure: RIGHT TOTAL KNEE ARTHROPLASTY;  Surgeon: Vickey Huger, MD;  Location: [place];  Service: Orthopedics;  Laterality: Right;    FAMILY HISTORY: Family History  Problem Relation Age of Onset  . Hypertension Mother   . Pancreatic cancer Mother   .  Prostate cancer Other     SOCIAL HISTORY: Social History   Socioeconomic History  . Marital status: Married    Spouse name: Not on file  . Number of children: 2  . Years of education: college  . Highest education level: Not on file  Occupational History    Employer: [employer] Needs  . Financial resource strain: Not on file  . Food insecurity    Worry: Not on file    Inability: Not on file  . Transportation needs    Medical: Not on file    Non-medical: Not on file  Tobacco Use  . Smoking status: Never Smoker  . Smokeless tobacco: Never Used  Substance and Sexual Activity  . Alcohol use: No    Alcohol/week: 0.0 standard drinks  . Drug use: No  . Sexual activity: Yes    Birth control/protection: Surgical  Lifestyle  . Physical activity    Days per week: Not on file    Minutes per session: Not on file  . Stress: Not on file  Relationships  . Social Herbalist on phone: Not on file    Gets together: Not on file    Attends religious service: Not on file    Active member of club or organization: Not on file    Attends meetings of clubs or organizations: Not on file    Relationship status: Not on file  . Intimate partner violence    Fear of current or ex partner: Not on file    Emotionally abused: Not on file    Physically abused: Not on file    Forced sexual activity: Not on file  Other Topics Concern  . Not on file  Social History Narrative   Lives with husband   Caffeine use:      PHYSICAL  EXAM  Vitals:   03/27/19 0918  BP: 118/75  Pulse: 73  Temp: (!) 97.3 F (36.3 C)  TempSrc: Oral  Weight: 215 lb 9.6 oz (97.8 kg)  Height: 5\' 4"  (1.626 m)   Generalized: Well developed, in no acute distress  Chest: Lungs clear to auscultation bilaterally  Neurological examination  Mentation: Alert oriented to time, place, history taking. Follows all commands speech and language fluent Cranial nerve II-XII: Extraocular movements were full, visual field were full on confrontational test Head turning and shoulder shrug  were normal and symmetric. Motor: The motor testing reveals 5 over 5 strength of all 4 extremities. Good symmetric motor tone is noted throughout.  Sensory: Sensory testing is intact to soft touch on all 4 extremities. No evidence of extinction is noted.  Gait and station: Gait is normal.    DIAGNOSTIC DATA (LABS, IMAGING, TESTING) - I reviewed patient records, labs, notes, testing and imaging myself where available.  Lab Results  Component Value Date   WBC 3.9 (L) 07/05/2017   HGB 13.4 07/05/2017   HCT 41.2 07/05/2017   MCV 84.9 07/05/2017   PLT 239 07/05/2017      Component Value Date/Time   NA 138 07/05/2017 1059   K 3.4 (L) 07/05/2017 1059   CL 101 07/05/2017 1059   CO2 28 07/05/2017 1059   GLUCOSE 131 (H) 07/05/2017 1059   BUN 15 07/05/2017 1059   CREATININE 0.97 07/05/2017 1059   CALCIUM 9.9 07/05/2017 1059   PROT 8.0 10/18/2013 0950   ALBUMIN 4.2 10/18/2013 0950   AST 27 10/18/2013 0950   ALT 25 10/18/2013 0950   ALKPHOS 122 (  H) 10/18/2013 0950   BILITOT 0.5 10/18/2013 0950   GFRNONAA 59 (L) 07/05/2017 1059   GFRAA >60 07/05/2017 1059   Lab Results  Component Value Date   CHOL 170 02/07/2013   HDL 16 (L) 02/07/2013   LDLCALC 130 (H) 02/07/2013   TRIG 120 02/07/2013   CHOLHDL 10.6 02/07/2013   Lab Results  Component Value Date   HGBA1C 6.3 (H) 07/05/2017   No results found for: VITAMINB12 Lab Results  Component Value Date   TSH  0.556 02/07/2013      ASSESSMENT AND PLAN 68 y.o. year old female  has a past medical history of Allergic rhinitis, Arthritis, Cataracts, bilateral, CHF (congestive heart failure) ([place]) (123456), Complication of anesthesia, DDD (degenerative disc disease), lumbar, Diabetes mellitus without complication ([place]), Dizziness, GERD (gastroesophageal reflux disease), High cholesterol, History of colon polyps, History of migraine, Hypertension, Joint pain, Joint swelling, Migraine, Migraines, Pneumonia, Seizures ([place]), Shortness of breath (02/06/2013), Sleep apnea, Stroke ([place]), and TIA (transient ischemic attack). here with:  1. Obstructive sleep apnea on CPAP 2. Migraine headaches  Patient CPAP download shows excellent compliance and good treatment of her apnea.  She is encouraged to continue using CPAP nightly and greater than 4 hours each night.  We discussed other treatment options for her migraine such as the injectable medications Aimovig or Ajovy however the patient has a fear of needles.  We discussed oral medication however she does not wish to start any new medication at this time.  She is advised that if her symptoms worsen or she develops new symptoms she should let us know.  She will follow-up in 1 year or sooner if needed   Ward Givens, MSN, NP-C 03/27/2019, 9:29 AM [facility] [address] [place], [place] [address] [phone]

## 2019-05-16 NOTE — Progress Notes (Unsigned)
Patient ID: Robyn Aguilar                 DOB: [birth year]                    MRN: [id number]     HPI: Robyn Aguilar is a 69 y.o. female patient of Dr. Dema Severin [facility]) referred to lipid clinic by Dr. [name]. PMH is significant for moderate LV hypertrophy (on G1DD 2014), and an EF of 65-70% (on echo 2014), HTN, HLD, GERD, and TIA x2. An EKG on 03/13/19 showed normal sinus rhythm, 75 bpm, left axis deviation, poor R wave progression (which was consistent with EKG from 06/2017). She is followed regularly by her PCP at [facility], and infrequently by Dr. [name] here at [facility]. She has a history of interolerances to daily statins, as well as every other day dosing with Crestor 5 mg. Her LDL and triglycerides have remained elevated for several years now. She is currently scheduled for repeat ECHO on 03/25/19. Of note, patient has a fear of needles.  Patient presents for follow up appt with lipid clinic.  How to find her labs in the chart??   Current Medications: Crestor 5mg  3 days a week, Nexlizet 180mg /10mg  once daily Intolerances:  -Crestor 5 mg every other day, Lipitor, Pravachol (muscle and joint pain/cramping with all) -Welchol (stomach cramps) Risk Factors: Age, HTN (controlled), baseline LDL > 200, TIA x2 LDL goal: <70 mg/dL  Diet: Breakfast - egg white bites, toast, eggs, bacon, grits, oatmeal, maybe a egg and sausage biscuit from a reasturant, decaf coffee. Lunch - salad with grilled chicken. Supper - some kind of green vegetable(s), a meat protein (non-friend, only baked or grilled), sometimes a small portion of rice or potatoes.   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   Family History: Hypertension in her mother; Pancreatic cancer in her mother; Prostate cancer in an other family member. Hyperlipidemia is mother and  others (unclear which others).   Social History: Married with 2 children. Denies tobacco, alcohol, or illicit drug use.   Labs: states she has had a few tests since 2019, but we dont see them on our end, and she was unsure what her levels were.  04/23/2018: TC 290, TG 159, HDL 51 LDL 207 08/11/2016: TC 311, TG 146, HDL 47, LDL 236.  01/28/2015:TC 318, TG 145, HDL 50, LDL 239.  07/21/2014: TC 328, TG 166, HDL 51, LDL 245.  11/20/2013: TC 300, TG 202, HDL 49, LDL 211.  02/07/2013: TC 170, TG 120, HDL 16, LDL 130.   Past Medical History:  Diagnosis Date  . Allergic rhinitis   . Arthritis    knees and neck  . Cataracts, bilateral    immature   . CHF (congestive heart failure) ([place]) 2014   after surgery  . Complication of anesthesia    had heart failure after knee surgery 02/04/13  . DDD (degenerative disc disease), lumbar   . Diabetes mellitus without complication ([place])   . Dizziness    takes Antivert prn  . GERD (gastroesophageal reflux disease)    takes Aciphex prn  . High cholesterol    but doesn't take any meds  . History of colon polyps   . History of migraine    last one 6-48months ago;takes Topamax nightly  . Hypertension    takes Metoprolol and maxzide daily  .  Joint pain   . Joint swelling   . Migraine   . Migraines   . Pneumonia    at age 84  . Seizures ([place])    hx of;last one 33yrs ago;no meds required;states it was brought on by med;only one ever had  . Shortness of breath 02/06/2013  . Sleep apnea    sleep study in epic from 2014-uses CPAP  . Stroke [facility])    2 TIA  . TIA (transient ischemic attack)     Current Outpatient Medications on File Prior to Visit  Medication Sig Dispense Refill  . aspirin EC 81 MG tablet Take 81 mg by mouth daily.    . Bempedoic Acid-Ezetimibe (NEXLIZET) 180-10 MG TABS Take 1 tablet by mouth daily. 30 tablet 11  . cetirizine (ZYRTEC) 10 MG tablet Take 10 mg by mouth at bedtime.     . fluticasone (FLONASE) 50 MCG/ACT nasal spray  Place 1 spray into both nostrils at bedtime as needed for allergies or rhinitis.     [name] LORazepam (ATIVAN) 0.5 MG tablet Take 0.25-0.5 mg by mouth 3 (three) times daily as needed.    . meperidine (DEMEROL) 50 MG tablet Take 50 mg by mouth as needed.    . metoprolol succinate (TOPROL-XL) 50 MG 24 hr tablet Take 50 mg by mouth daily. Take with or immediately following a meal.    . RABEprazole (ACIPHEX) 20 MG tablet Take 20 mg by mouth daily.    . rosuvastatin (CRESTOR) 5 MG tablet Take 5 mg by mouth 2 (two) times a week.    . triamterene-hydrochlorothiazide (MAXZIDE) 75-50 MG tablet Take 1 tablet by mouth daily.  1   No current facility-administered medications on file prior to visit.    Allergies  Allergen Reactions  . Codeine Hives  . Statins Other (See Comments)    Muscle pain  . Adhesive [Tape] Rash  . Latex Rash    Assessment/Plan:  1. Hyperlipidemia - LDL goal < 70 mg/dL; therefore, patient is  Thank you for involving pharmacy to assist in providing Robyn Aguilar's care.   Drexel Iha, PharmD PGY2 Ambulatory Care Pharmacy Resident

## 2019-05-21 ENCOUNTER — Ambulatory Visit: Payer: Medicare Other

## 2019-05-23 ENCOUNTER — Ambulatory Visit: Payer: Medicare PPO

## 2019-05-23 NOTE — Progress Notes (Deleted)
Patient ID: Robyn Aguilar                 DOB: 1950-09-07                    MRN: [id number]     HPI: Robyn Aguilar is a 69 y.o. female patient of Dr. Dema Severin [facility]) referred to lipid clinic by Dr. [name]. PMH is significant for moderate LV hypertrophy (on G1DD 2014), and an EF of 6-65% (on echo 03/2019), HTN, HLD, GERD, and TIA x2. An EKG on 03/13/19 showed normal sinus rhythm, 75 bpm, left axis deviation, poor R wave progression (which was consistent with EKG from 06/2017). She is followed regularly by her PCP at [facility], and Dr. [name] here at [facility]. She has a history of interolerances to daily statins, as well as every other day dosing with Crestor 5 mg. Her LDL and triglycerides have remained elevated for several years now.   Today, patient presents to clinic in generally good spirits. She reports taking rosuvastatin regularly three times a week now. She had been off of statins from April to July due to life events, but resumed taking rosuvastatin around September or October.   She endorses having tried several statins in her past, including Lipitor, Pravachol, and others she couldn't remember and has had bad muscle/joint cramping and pain (including in hands, which has impacted her work in the past) with all of them. She had previously tried rosuvastatin 5 mg three times a week, reduced it to twice a week due to side effects, but has since gone back up to three times a week and is tolerating it well. She also endorses having tried Welchol in the past but experienced stomach cramps. Patient was unsure if she had tried Zetia, but the name and dosing did not sound familiar to her. Her PCP had attempted to start her on a PCSK9 inhibitor a year or two ago (per patient), but at the time her copay would have been several hundred dollars. She also expressed a fear of needles, which makes her unwilling to try a PCSK9 inhibitor at this time. She would prefer to have her  cholesterol checked with her PCP office in order to reduce the number of needle sticks needed, since she will have upcoming blood work soon already.   Current Medications:   Rosuvastatin 5 mg three times a week   Intolerances:  Crestor 5 mg every other day, Lipitor, Pravachol (muscle and joint pain/cramping with all) Welchol (stomach cramps)  Risk Factors: Age, HTN (controlled), baseline LDL > 200, TIA x2  LDL goal: <70 mg/dL  Diet: Breakfast - egg white bites, toast, eggs, bacon, grits, oatmeal, maybe a egg and sausage biscuit from a reasturant, decaf coffee. Lunch - salad with grilled chicken. Supper - some kind of green vegetable(s), a meat protein (non-friend, only baked or grilled), sometimes a small portion of rice or potatoes.   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   Family History: Hypertension in her mother; Pancreatic cancer in her mother; Prostate cancer in an other family member. Hyperlipidemia is mother and others (unclear which others).   Social History: Married with 2 children. Denies tobacco, alcohol, or illicit drug use.   Labs:   04/23/2018: TC 290, TG 159, HDL 51 LDL 207 08/11/2016: TC 311, TG 146, HDL 47, LDL 236  01/28/2015:TC 318,  TG 145, HDL 50, LDL 239  07/21/2014: TC 328, TG 166, HDL 51, LDL 245  11/20/2013: TC 300, TG 202, HDL 49, LDL 211  02/07/2013: TC 170, TG 120, HDL 16, LDL 130  Past Medical History:  Diagnosis Date  . Allergic rhinitis   . Arthritis    knees and neck  . Cataracts, bilateral    immature   . CHF (congestive heart failure) ([place]) 2014   after surgery  . Complication of anesthesia    had heart failure after knee surgery 02/04/13  . DDD (degenerative disc disease), lumbar   . Diabetes mellitus without complication ([place])   . Dizziness    takes Antivert prn  . GERD (gastroesophageal reflux disease)     takes Aciphex prn  . High cholesterol    but doesn't take any meds  . History of colon polyps   . History of migraine    last one 6-41months ago;takes Topamax nightly  . Hypertension    takes Metoprolol and maxzide daily  . Joint pain   . Joint swelling   . Migraine   . Migraines   . Pneumonia    at age 67  . Seizures ([place])    hx of;last one 56yrs ago;no meds required;states it was brought on by med;only one ever had  . Shortness of breath 02/06/2013  . Sleep apnea    sleep study in epic from 2014-uses CPAP  . Stroke [facility])    2 TIA  . TIA (transient ischemic attack)     Current Outpatient Medications on File Prior to Visit  Medication Sig Dispense Refill  . aspirin EC 81 MG tablet Take 81 mg by mouth daily.    . Bempedoic Acid-Ezetimibe (NEXLIZET) 180-10 MG TABS Take 1 tablet by mouth daily. 30 tablet 11  . cetirizine (ZYRTEC) 10 MG tablet Take 10 mg by mouth at bedtime.     . fluticasone (FLONASE) 50 MCG/ACT nasal spray Place 1 spray into both nostrils at bedtime as needed for allergies or rhinitis.     [name] LORazepam (ATIVAN) 0.5 MG tablet Take 0.25-0.5 mg by mouth 3 (three) times daily as needed.    . meperidine (DEMEROL) 50 MG tablet Take 50 mg by mouth as needed.    . metoprolol succinate (TOPROL-XL) 50 MG 24 hr tablet Take 50 mg by mouth daily. Take with or immediately following a meal.    . RABEprazole (ACIPHEX) 20 MG tablet Take 20 mg by mouth daily.    . rosuvastatin (CRESTOR) 5 MG tablet Take 5 mg by mouth 2 (two) times a week.    . triamterene-hydrochlorothiazide (MAXZIDE) 75-50 MG tablet Take 1 tablet by mouth daily.  1   No current facility-administered medications on file prior to visit.    Allergies  Allergen Reactions  . Codeine Hives  . Statins Other (See Comments)    Muscle pain  . Adhesive [Tape] Rash  . Latex Rash    Assessment/Plan:  1. Hyperlipidemia - Patient's LDL remains above goal of <70 mg/dL. Patient has a history of multiple statin  intolerances, which prevents increase or switch in statin therapy. She also endorses intolerance to Welchol. Add Nexlizet 180/10 mg once daily for improved LDL reduction as pt remains opposed to injectable therapy. Continue rosuvastatin 5 mg three times a week. Continue to improve diet, and increase physical exercise through [employer] workout program. Patient information collected for application to [employer] (2 members in house, $60K/year, [phone]). Check fasting cholesterol levels with PCP office  in 6 weeks, and follow-up with lipid clinic in 8 weeks to discuss results and future options.   Claudina Lick, PharmD Candidate [phone] [place] [id number] [place] [address] Phone: [phone]; Fax: [phone]

## 2019-05-30 NOTE — Progress Notes (Signed)
Patient ID: Robyn Aguilar                 DOB: 23-Feb-1951                    MRN: [id number]     HPI: Robyn Aguilar is a 69 y.o. female patient of Dr. Dema Severin [facility]) referred to lipid clinic by Dr. [name]. PMH is significant for moderate LV hypertrophy (on G1DD 2014), and an EF of 60-65% (on echo 2020), HTN, HLD, GERD, and TIA x2. An EKG on 03/13/19 showed normal sinus rhythm, 75 bpm, left axis deviation, poor R wave progression (which was consistent with EKG from 06/2017). She is followed regularly by her PCP at [facility], and infrequently by Dr. [name] here at [facility]. She has a history of interolerances to daily statins, as well as every other day dosing with Crestor 5 mg.  Patient was last seen in clinic on 03/24/20. The patient reported not being adherent to rosuvastatin for a few months last year. She has tried atorvastatin, pravastatin, and other statins in the past and had bad muscle/joint cramping and pain (including in hands, which has impacted her work in the past) with all of them. She was currently tolerating rosuvastatin 5 mg three times a week. She also reports trying Welchol but had stomach cramps. She could not recall trying Zetia. The patient has a fear of needles and was unwilling to start a PCSK9 inhibitor. Provider started patient on Nexlizet 180/10 mg once daily.  Patient arrives at lipid clinic for follow-up. She states he is doing well on Nexlizet and only experiences stiffness in her knees. She says she stopped taking the rosuvastatin once she started the Nexlizet. The patient is not interested in restarting rosuvastatin. It appears more to do with not wanting to be on multiple medications than due to myalgias. The patient also expressed her fear of needles and was not interested in starting a PCSK9 inhibitor for further reduction of LDL. She continues to eat a healthy diet and drink plenty of water. 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  Current Medications: Nexlizet 180/10 mg once daily   Intolerances:  Crestor 5 mg every other day, Lipitor, Pravachol (muscle and joint pain/cramping with all) Welchol (stomach cramps)  Risk Factors: Age, HTN (controlled), baseline LDL > 200, TIA x2 LDL goal: <70 mg/dL  Diet: Breakfast - egg white bites, toast, eggs, bacon, grits, oatmeal, maybe a egg and sausage biscuit from a reasturant, decaf coffee. Lunch - salad with grilled chicken. Supper - some kind of green vegetable(s), a meat protein (non-friend, only baked or grilled), sometimes a small portion of rice or potatoes.   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   Family History: Hypertension in her mother; Pancreatic cancer in her mother; Prostate cancer in an other family member. Hyperlipidemia is mother and others (unclear which others).   Social History: Married with 2 children. Denies tobacco, alcohol, or illicit drug use.   Labs: 05/27/2019: TC 150, TG 116, HDL 44, LDL 85  (Nexlizet 180/10 mg) 04/23/2018: TC 290, TG 159, HDL 51 LDL 207 (rosuvastatin 5 mg TIW) 08/11/2016: TC 311, TG 146, HDL 47, LDL 236  Past Medical History:  Diagnosis Date  . Allergic rhinitis   . Arthritis    knees and neck  . Cataracts,  bilateral    immature   . CHF (congestive heart failure) ([place]) 2014   after surgery  . Complication of anesthesia    had heart failure after knee surgery 02/04/13  . DDD (degenerative disc disease), lumbar   . Diabetes mellitus without complication ([place])   . Dizziness    takes Antivert prn  . GERD (gastroesophageal reflux disease)    takes Aciphex prn  . High cholesterol    but doesn't take any meds  . History of colon polyps   . History of migraine    last one 6-58months ago;takes Topamax nightly  . Hypertension    takes Metoprolol and maxzide  daily  . Joint pain   . Joint swelling   . Migraine   . Migraines   . Pneumonia    at age 20  . Seizures ([place])    hx of;last one 31yrs ago;no meds required;states it was brought on by med;only one ever had  . Shortness of breath 02/06/2013  . Sleep apnea    sleep study in epic from 2014-uses CPAP  . Stroke [facility])    2 TIA  . TIA (transient ischemic attack)     Current Outpatient Medications on File Prior to Visit  Medication Sig Dispense Refill  . aspirin EC 81 MG tablet Take 81 mg by mouth daily.    . Bempedoic Acid-Ezetimibe (NEXLIZET) 180-10 MG TABS Take 1 tablet by mouth daily. 30 tablet 11  . cetirizine (ZYRTEC) 10 MG tablet Take 10 mg by mouth at bedtime.     . fluticasone (FLONASE) 50 MCG/ACT nasal spray Place 1 spray into both nostrils at bedtime as needed for allergies or rhinitis.     [name] LORazepam (ATIVAN) 0.5 MG tablet Take 0.25-0.5 mg by mouth 3 (three) times daily as needed.    . meperidine (DEMEROL) 50 MG tablet Take 50 mg by mouth as needed.    . metoprolol succinate (TOPROL-XL) 50 MG 24 hr tablet Take 50 mg by mouth daily. Take with or immediately following a meal.    . RABEprazole (ACIPHEX) 20 MG tablet Take 20 mg by mouth daily.    . rosuvastatin (CRESTOR) 5 MG tablet Take 5 mg by mouth 2 (two) times a week.    . triamterene-hydrochlorothiazide (MAXZIDE) 75-50 MG tablet Take 1 tablet by mouth daily.  1   No current facility-administered medications on file prior to visit.    Allergies  Allergen Reactions  . Codeine Hives  . Statins Other (See Comments)    Muscle pain  . Adhesive [Tape] Rash  . Latex Rash    Assessment/Plan:  1. Hyperlipidemia - LDL remains above goal of <70 mg/dL. The patient is is not interested in taking statins again in combination with Nexlizet due to myalgias and is not interested in a PCSK9 inhibitor due to fear of needles. However, there has been great (and unexpected) reduction in patient's LDL while taking Nexlizet. Patient will  continue taking Nexlizet 180/10 mg once daily. She will have her lipid panel drawn again at PCP in 2-3 months. Follow-up with clinic as needed.   Julieta Bellini, PharmD Candidate  Ramond Dial, [place].D, BCPS, CPP [place]  [id number], [place], [place] [address]  Phone: [phone]; Fax: [phone]

## 2019-05-31 ENCOUNTER — Other Ambulatory Visit: Payer: Self-pay

## 2019-05-31 ENCOUNTER — Ambulatory Visit (INDEPENDENT_AMBULATORY_CARE_PROVIDER_SITE_OTHER): Payer: Medicare PPO | Admitting: Pharmacist

## 2019-05-31 ENCOUNTER — Encounter: Payer: Self-pay | Admitting: Pharmacist

## 2019-05-31 DIAGNOSIS — E782 Mixed hyperlipidemia: Secondary | ICD-10-CM

## 2019-05-31 NOTE — Patient Instructions (Addendum)
It was nice seeing you today!  Continue taking Nexlizet 180/10 mg once daily.   Please call [phone] if you have any questions.

## 2019-06-28 ENCOUNTER — Other Ambulatory Visit: Payer: Medicare Other

## 2019-07-03 ENCOUNTER — Other Ambulatory Visit: Payer: Self-pay | Admitting: Internal Medicine

## 2019-07-04 ENCOUNTER — Other Ambulatory Visit: Payer: Self-pay | Admitting: Internal Medicine

## 2019-07-04 DIAGNOSIS — E041 Nontoxic single thyroid nodule: Secondary | ICD-10-CM

## 2019-07-10 ENCOUNTER — Ambulatory Visit
Admission: RE | Admit: 2019-07-10 | Discharge: 2019-07-10 | Disposition: A | Discharge status: Home / Self Care | Payer: Medicare PPO | Source: Ambulatory Visit | Attending: Internal Medicine | Admitting: Internal Medicine

## 2019-07-10 ENCOUNTER — Telehealth: Payer: Self-pay | Admitting: Interventional Cardiology

## 2019-07-10 ENCOUNTER — Telehealth: Payer: Self-pay

## 2019-07-10 DIAGNOSIS — E041 Nontoxic single thyroid nodule: Secondary | ICD-10-CM

## 2019-07-10 NOTE — Telephone Encounter (Signed)
Pt c/o medication issue:  1. Name of Medication: Bempedoic Acid-Ezetimibe (NEXLIZET) 180-10 MG TABS  2. How are you currently taking this medication (dosage and times per day)? 1 tablet by mouth daily   3. Are you having a reaction (difficulty breathing--STAT)? No   4. What is your medication issue? Robyn Aguilar is calling stating when she tried to pick up her Nexlizet at the pharmacy they were trying to charge her $300+ and she cannot afford this. She reached out to her insurance company and they stated the grant she was on does not work through [employer]. She states the pharmacy has faxed over a form for Robyn Aguilar to get the medication at a discounted price. She is going out of town on Saturday and needs this issue resolved before than so she has her medication. Please advise.

## 2019-07-10 NOTE — Telephone Encounter (Signed)
**Note De-Identified Aurea Aronov Obfuscation** I started a Nexlizet tier exception through covermymeds. Key: [id number]

## 2019-07-10 NOTE — Telephone Encounter (Signed)
Pharmacy just needs to process the grant info they were previously given by [place]. Do not know why they stopped applying it to rx. Called pharmacy who fixed the issue and did not need any additional information.

## 2019-07-11 NOTE — Telephone Encounter (Addendum)
**Note De-Identified Robyn Aguilar Obfuscation** Letter received from [facility] stating that they have approved coverage of the pts Nexlizet. Approval is good until 05/01/2020.  We also received a letter from [name] stating that they denied the tier exception for the pts Nexlizet. Reason: There are less costing medications on the pts plan of covered drugs that have not been tried. Recommendation: The patient should review her plan and discuss options with Dr [name].  The letter states that they have notified the pt of decision as well.

## 2019-09-02 ENCOUNTER — Telehealth: Payer: Self-pay | Admitting: Interventional Cardiology

## 2019-09-02 NOTE — Telephone Encounter (Signed)
Pt states she has been experiencing lower extremity swelling for the past week. She does not weigh herself regularly but thinks she has gained about 3 pounds over night. She does try to elevated her legs in the evening and they do seem to decrease in size when she goes this. I spoke with Dr. [name] regarding the above and he advised for patient to keep her appointment with JM tomorrow. I advised the patient to start keeping a log of daily weights and avoid salt as well as continue elevating her feet as much as possible. She verbalized understanding. We reviewed symptoms that would warrant a call to EMS versus this office. She agrees to keep appointment with JM tomorrow.

## 2019-09-02 NOTE — Telephone Encounter (Signed)
Pt c/o swelling: STAT is pt has developed SOB within 24 hours  1) How much weight have you gained and in what time span? Believes 5 or 10 in lbs since last seen november  2) If swelling, where is the swelling located? Ankles, worse in the last week  3) Are you currently taking a fluid pill? yes  4) Are you currently SOB? no  5) Do you have a log of your daily weights (if so, list)? no  6) Have you gained 3 pounds in a day or 5 pounds in a week? Believes she has gained 3 lbs in a day  7) Have you traveled recently? no   Patient states she has swelling in her ankles and it has been getting worse. She states her ankles are getting dark and also hurt and have a stinging sensation. She states she believes she has gained  3 lbs in a day. She states the swelling goes down when she elevates them. She has an appointment tomorrow 09/03/2019 with Kathyrn Drown.

## 2019-09-03 ENCOUNTER — Ambulatory Visit: Payer: Medicare PPO | Admitting: Cardiology

## 2019-09-03 NOTE — Telephone Encounter (Signed)
Patient calling stating she went to see her PCP yesterday who decided to do some tests. She cancelled her appointment today and states she would like Dr. [name] to know she will call back to reschedule when she has the results.

## 2019-11-05 ENCOUNTER — Ambulatory Visit: Payer: Self-pay | Admitting: Orthopedic Surgery

## 2019-11-05 ENCOUNTER — Telehealth: Payer: Self-pay | Admitting: Interventional Cardiology

## 2019-11-05 NOTE — Telephone Encounter (Signed)
Agree with clearance for surgery with Dr. Rolena Infante.

## 2019-11-05 NOTE — Telephone Encounter (Signed)
I reached out to the pt in regards to her clearance form for her upcoming surgery with Dr. Melina Schools with [facility]. I stated that I have not yet received a clearance request from Dr. Rolena Infante office. I assured the pt that I will reach out to Dr. Rolena Infante office and request clearance form be faxed to our office. I assured the pt once I have the clearance form I will get the information to the pre op team for assessment.   I left message for Orson Slick with [facility] [phone] to please fax over a clearance form for upcoming surgery for the pt.

## 2019-11-05 NOTE — Telephone Encounter (Signed)
   Primary Cardiologist: Sinclair Grooms, MD  Chart reviewed as part of pre-operative protocol coverage. Patient was contacted 11/05/2019 in reference to pre-operative risk assessment for pending surgery as outlined below.  Robyn Aguilar was last seen on 03/12/2020 by Dr. [name].  Since that day, Robyn Aguilar has done well without chest pain or shortness of breath. Despite the back pain, she is able to accomplish 4 METS of activity. She has no prior h/o CAD. Last myoview in 2014 was low risk. Recent echo obtained in Nov 2020 showed normal EF, mild LVH but no sign of HOCM, no significant valve issue.   Therefore, based on ACC/AHA guidelines, the patient would be at acceptable risk for the planned procedure without further cardiovascular testing.   I will route this recommendation to the requesting party via Epic fax function and remove from pre-op pool. Please call with questions.  She may hold aspirin for 7 days prior to the surgery and restart as soon as possible afterward at the surgeon's discretion.   [place], [place] 11/05/2019, 6:39 PM

## 2019-11-05 NOTE — Telephone Encounter (Signed)
Patient states that Dr. Rolena Infante' office faxed over a surgical clearance form to our office and is waiting on the response for her upcoming surgery on 11/21/19. Please advise.

## 2019-11-05 NOTE — Telephone Encounter (Signed)
Patient calling back stating her husband gave the form to a Mickey in the office. She states they took the form and gave him a copy.

## 2019-11-05 NOTE — Telephone Encounter (Signed)
   [facility] Pre-operative Risk Assessment    HEARTCARE STAFF: - Please ensure there is not already an duplicate clearance open for this procedure. - Under Visit Info/Reason for Call, type in Other and utilize the format Clearance MM/DD/YY or Clearance TBD. Do not use dashes or single digits. - If request is for dental extraction, please clarify the # of teeth to be extracted.  Request for surgical clearance:  1. What type of surgery is being performed? XLIF WITH LATERAL PLATE L2-3   2. When is this surgery scheduled? 11/21/19   3. What type of clearance is required (medical clearance vs. Pharmacy clearance to hold med vs. Both)? MEDICAL  4. Are there any medications that need to be held prior to surgery and how long? ASA    5. Practice name and name of physician performing surgery? [facility]; DR. Boutte   6. What is the office phone number? [phone]   7.   What is the office fax number? [place]  8.   Anesthesia type (None, local, MAC, general) ? GENERAL   Julaine Hua 11/05/2019, 4:22 PM  _________________________________________________________________   (provider comments below)

## 2019-11-13 NOTE — Progress Notes (Signed)
[id number] [place], [place] AT [facility] OF [address] [place] [address] Phone: [phone] Fax: [phone]      Your procedure is scheduled on Thursday, July 22nd.  Report to [facility] Main Entrance "A" at 11:15 A.M., and check in at the Admitting office.  Call this number if you have problems the morning of surgery:  [phone]  Call [phone] if you have any questions prior to your surgery date Monday-Friday 8am-4pm    Remember:  Do not eat or drink after midnight the night before your surgery     Take these medicines the morning of surgery with A SIP OF WATER   Zyrtec - if needed  Flonase nasal spray  Gabapentin (neurontin)   Lorazepam (Ativan)  Metoprolol  Aciphex  As of today, STOP taking any Aspirin (unless otherwise instructed by your surgeon) Aleve, Naproxen, Ibuprofen, Motrin, Advil, Goody's, BC's, all herbal medications, fish oil, and all vitamins.                      Do not wear jewelry, make up, or nail polish            Do not wear lotions, powders, perfumes, or deodorant.            Do not shave 48 hours prior to surgery.              Do not bring valuables to the hospital.            [facility] is not responsible for any belongings or valuables.  Do NOT Smoke (Tobacco/Vaping) or drink Alcohol 24 hours prior to your procedure If you use a CPAP at night, you may bring all equipment for your overnight stay.   Contacts, glasses, dentures or bridgework may not be worn into surgery.      For patients admitted to the hospital, discharge time will be determined by your treatment team.   Patients discharged the day of surgery will not be allowed to drive home, and someone needs to stay with them for 24 hours.    Special instructions:   [facility]- Preparing For Surgery  Before surgery, you can play an important role. Because skin is not sterile, your skin needs to be as free of germs as  possible. You can reduce the number of germs on your skin by washing with CHG (chlorahexidine gluconate) Soap before surgery.  CHG is an antiseptic cleaner which kills germs and bonds with the skin to continue killing germs even after washing.    Oral Hygiene is also important to reduce your risk of infection.  Remember - BRUSH YOUR TEETH THE MORNING OF SURGERY WITH YOUR REGULAR TOOTHPASTE  Please do not use if you have an allergy to CHG or antibacterial soaps. If your skin becomes reddened/irritated stop using the CHG.  Do not shave (including legs and underarms) for at least 48 hours prior to first CHG shower. It is OK to shave your face.  Please follow these instructions carefully.   1. Shower the NIGHT BEFORE SURGERY and the MORNING OF SURGERY with CHG Soap.   2. If you chose to wash your hair, wash your hair first as usual with your normal shampoo.  3. After you shampoo, rinse your hair and body thoroughly to remove the shampoo.  4. Use CHG as you would any other liquid soap. You can apply CHG directly  to the skin and wash gently with a scrungie or a clean washcloth.   5. Apply the CHG Soap to your body ONLY FROM THE NECK DOWN.  Do not use on open wounds or open sores. Avoid contact with your eyes, ears, mouth and genitals (private parts). Wash Face and genitals (private parts)  with your normal soap.   6. Wash thoroughly, paying special attention to the area where your surgery will be performed.  7. Thoroughly rinse your body with warm water from the neck down.  8. DO NOT shower/wash with your normal soap after using and rinsing off the CHG Soap.  9. Pat yourself dry with a CLEAN TOWEL.  10. Wear CLEAN PAJAMAS to bed the night before surgery  11. Place CLEAN SHEETS on your bed the night of your first shower and DO NOT SLEEP WITH PETS.   Day of Surgery: Wear Clean/Comfortable clothing the morning of surgery Do not apply any deodorants/lotions.   Remember to brush your teeth  WITH YOUR REGULAR TOOTHPASTE.   Please read over the following fact sheets that you were given.

## 2019-11-14 ENCOUNTER — Other Ambulatory Visit: Payer: Self-pay

## 2019-11-14 ENCOUNTER — Ambulatory Visit (HOSPITAL_COMMUNITY)
Admission: RE | Admit: 2019-11-14 | Discharge: 2019-11-14 | Disposition: A | Discharge status: Home / Self Care | Payer: Medicare PPO | Source: Ambulatory Visit | Attending: Orthopedic Surgery | Admitting: Orthopedic Surgery

## 2019-11-14 ENCOUNTER — Encounter (HOSPITAL_COMMUNITY): Payer: Self-pay

## 2019-11-14 ENCOUNTER — Encounter (HOSPITAL_COMMUNITY)
Admission: RE | Admit: 2019-11-14 | Discharge: 2019-11-14 | Disposition: A | Discharge status: Home / Self Care | Payer: Medicare PPO | Source: Ambulatory Visit | Attending: Orthopedic Surgery | Admitting: Orthopedic Surgery

## 2019-11-14 DIAGNOSIS — K219 Gastro-esophageal reflux disease without esophagitis: Secondary | ICD-10-CM | POA: Diagnosis not present

## 2019-11-14 DIAGNOSIS — G4733 Obstructive sleep apnea (adult) (pediatric): Secondary | ICD-10-CM | POA: Diagnosis not present

## 2019-11-14 DIAGNOSIS — Z01818 Encounter for other preprocedural examination: Secondary | ICD-10-CM | POA: Insufficient documentation

## 2019-11-14 DIAGNOSIS — Z7982 Long term (current) use of aspirin: Secondary | ICD-10-CM | POA: Insufficient documentation

## 2019-11-14 DIAGNOSIS — Z8673 Personal history of transient ischemic attack (TIA), and cerebral infarction without residual deficits: Secondary | ICD-10-CM | POA: Diagnosis not present

## 2019-11-14 DIAGNOSIS — I5032 Chronic diastolic (congestive) heart failure: Secondary | ICD-10-CM | POA: Diagnosis not present

## 2019-11-14 DIAGNOSIS — Z79899 Other long term (current) drug therapy: Secondary | ICD-10-CM | POA: Insufficient documentation

## 2019-11-14 DIAGNOSIS — E119 Type 2 diabetes mellitus without complications: Secondary | ICD-10-CM | POA: Diagnosis not present

## 2019-11-14 DIAGNOSIS — E7800 Pure hypercholesterolemia, unspecified: Secondary | ICD-10-CM | POA: Diagnosis not present

## 2019-11-14 DIAGNOSIS — I110 Hypertensive heart disease with heart failure: Secondary | ICD-10-CM | POA: Insufficient documentation

## 2019-11-14 DIAGNOSIS — E669 Obesity, unspecified: Secondary | ICD-10-CM | POA: Diagnosis not present

## 2019-11-14 DIAGNOSIS — I509 Heart failure, unspecified: Secondary | ICD-10-CM | POA: Diagnosis not present

## 2019-11-14 HISTORY — DX: Anxiety disorder, unspecified: F41.9

## 2019-11-14 LAB — CBC
HCT: 43.4 % (ref 36.0–46.0)
Hemoglobin: 13.7 g/dL (ref 12.0–15.0)
MCH: 26.6 pg (ref 26.0–34.0)
MCHC: 31.6 g/dL (ref 30.0–36.0)
MCV: 84.3 fL (ref 80.0–100.0)
Platelets: 277 10*3/uL (ref 150–400)
RBC: 5.15 MIL/uL — ABNORMAL HIGH (ref 3.87–5.11)
RDW: 15.2 % (ref 11.5–15.5)
WBC: 3.7 10*3/uL — ABNORMAL LOW (ref 4.0–10.5)
nRBC: 0 % (ref 0.0–0.2)

## 2019-11-14 LAB — PROTIME-INR
INR: 0.9 (ref 0.8–1.2)
Prothrombin Time: 12 seconds (ref 11.4–15.2)

## 2019-11-14 LAB — TYPE AND SCREEN
ABO/RH(D): B POS
Antibody Screen: NEGATIVE

## 2019-11-14 LAB — URINALYSIS, ROUTINE W REFLEX MICROSCOPIC
Bilirubin Urine: NEGATIVE
Glucose, UA: NEGATIVE mg/dL
Hgb urine dipstick: NEGATIVE
Ketones, ur: NEGATIVE mg/dL
Leukocytes,Ua: NEGATIVE
Nitrite: NEGATIVE
Protein, ur: NEGATIVE mg/dL
Specific Gravity, Urine: 1.017 (ref 1.005–1.030)
pH: 7 (ref 5.0–8.0)

## 2019-11-14 LAB — APTT: aPTT: 30 seconds (ref 24–36)

## 2019-11-14 LAB — BASIC METABOLIC PANEL
Anion gap: 10 (ref 5–15)
BUN: 20 mg/dL (ref 8–23)
CO2: 28 mmol/L (ref 22–32)
Calcium: 9.8 mg/dL (ref 8.9–10.3)
Chloride: 101 mmol/L (ref 98–111)
Creatinine, Ser: 1 mg/dL (ref 0.44–1.00)
GFR calc Af Amer: >60 mL/min (ref >60)
GFR calc non Af Amer: 57 mL/min — ABNORMAL LOW (ref >60)
Glucose, Bld: 115 mg/dL — ABNORMAL HIGH (ref 70–99)
Potassium: 3.5 mmol/L (ref 3.5–5.1)
Sodium: 139 mmol/L (ref 135–145)

## 2019-11-14 LAB — SURGICAL PCR SCREEN
MRSA, PCR: NEGATIVE
Staphylococcus aureus: NEGATIVE

## 2019-11-14 LAB — GLUCOSE, CAPILLARY: Glucose-Capillary: 108 mg/dL — ABNORMAL HIGH (ref 70–99)

## 2019-11-14 LAB — HEMOGLOBIN A1C
Hgb A1c MFr Bld: 6.9 % — ABNORMAL HIGH (ref 4.8–5.6)
Mean Plasma Glucose: 151.33 mg/dL

## 2019-11-14 NOTE — Progress Notes (Signed)
PCP - [place] [facility] -H. SMITH      Chest x-ray - 7/20 EKG - 11/20 Stress Test - NA ECHO - 11/20 Cardiac Cath - NA  Sleep Study - YES CPAP - YES  Fasting Blood Sugar - 108 Checks Blood Sugar ___3__ times a day  Aspirin Instructions:STOP  COVID TEST- FOR 20TH   Anesthesia review: HEART HX  Patient denies shortness of breath, fever, cough and chest pain at PAT appointment   All instructions explained to the patient, with a verbal understanding of the material. Patient agrees to go over the instructions while at home for a better understanding. Patient also instructed to self quarantine after being tested for COVID-19. The opportunity to ask questions was provided.

## 2019-11-15 ENCOUNTER — Ambulatory Visit: Payer: Self-pay | Admitting: Orthopedic Surgery

## 2019-11-15 DIAGNOSIS — M48061 Spinal stenosis, lumbar region without neurogenic claudication: Secondary | ICD-10-CM | POA: Diagnosis not present

## 2019-11-15 DIAGNOSIS — Z01818 Encounter for other preprocedural examination: Secondary | ICD-10-CM | POA: Diagnosis not present

## 2019-11-15 NOTE — H&P (Deleted)
  The note originally documented on this encounter has been moved the the encounter in which it belongs.  

## 2019-11-15 NOTE — Anesthesia Preprocedure Evaluation (Addendum)
Anesthesia Evaluation  Patient identified by MRN, date of birth, ID band Patient awake    Reviewed: Allergy & Precautions, NPO status , Patient's Chart, lab work & pertinent test results, reviewed documented beta blocker date and time   History of Anesthesia Complications Negative for: history of anesthetic complications  Airway Mallampati: III  TM Distance: >3 FB Neck ROM: Limited    Dental  (+) Teeth Intact, Dental Advisory Given   Pulmonary shortness of breath, sleep apnea and Continuous Positive Airway Pressure Ventilation ,    Pulmonary exam normal breath sounds clear to auscultation       Cardiovascular hypertension, Pt. on home beta blockers and Pt. on medications (-) angina+CHF  (-) CAD and (-) Past MI Normal cardiovascular exam Rhythm:Regular Rate:Normal  '20 ECHO: Left ventricular ejection fraction, by visual estimation, is 60-65%. The left ventricle has normal function. Left ventricular septal wall thickness was mildly increased. Mildly increased left ventricular posterior wall thickness. There is mildly increased left ventricular hypertrophy.    Neuro/Psych  Headaches, Seizures - (no meds), Well Controlled,  PSYCHIATRIC DISORDERS Anxiety TIA   GI/Hepatic Neg liver ROS, GERD  Medicated and Controlled,  Endo/Other  diabetes, Well Controlled, Type 2 Obesity   Renal/GU negative Renal ROS     Musculoskeletal  (+) Arthritis ,   Abdominal   Peds  Hematology negative hematology ROS (+)   Anesthesia Other Findings Covid test negative   Reproductive/Obstetrics                          Anesthesia Physical Anesthesia Plan  ASA: III  Anesthesia Plan: General   Post-op Pain Management:    Induction: Intravenous  PONV Risk Score and Plan: 4 or greater and Treatment may vary due to age or medical condition, Ondansetron, Midazolam and Dexamethasone  Airway Management Planned: Oral ETT and  Video Laryngoscope Planned  Additional Equipment: None  Intra-op Plan:   Post-operative Plan: Extubation in OR  Informed Consent: I have reviewed the patients History and Physical, chart, labs and discussed the procedure including the risks, benefits and alternatives for the proposed anesthesia with the patient or authorized representative who has indicated his/her understanding and acceptance.     Dental advisory given  Plan Discussed with: CRNA and Anesthesiologist  Anesthesia Plan Comments: (See PAT note  )      Anesthesia Quick Evaluation

## 2019-11-15 NOTE — H&P (Signed)
Subjective:   Robyn Aguilar is a pleasant 69 year old female with past medical history significant for diabetes (last A1c less than 7), Heart failure, Previous stroke/TIA (on aspirin), Previous L2-3 decompression who has had back buttock and recurrent right L2 radicular pain. Previous L2 selective nerve root block have identified this level of her primary pain generator. Despite conservative treatment including injections and medications, the patient continues to have severe, debilitating pain that is affecting her quality-of-life and therefore she would like to move forward with surgical intervention. The patient is here today for a pre-operative History and Physical. They are scheduled for XLIF-L2-3 on 11-21-19 with Dr. Rolena Infante at [facility].  Patient Active Problem List   Diagnosis Date Noted  . Morbid obesity ([place]) 03/22/2018  . Cerebral microvascular disease 03/22/2018  . Retrognathia 12/24/2017  . OSA on CPAP 12/24/2017  . Status post lumbar spine operative procedure for decompression of spinal cord 07/13/2017  . 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 Pelvic mass in female 10/22/2013  . Chest tightness 03/08/2013  . Chronic diastolic heart failure ([place]) 03/08/2013  . Acute respiratory failure with hypoxia ([place]) 02/06/2013  . Pleural effusion 02/06/2013  . S/P right total knee arthroplasty 02/06/2013  . Hypertension 02/06/2013  . Seizure ([place]) 02/06/2013  . Hyperlipidemia 02/06/2013  . Migraines 02/06/2013  . Pain in joint, shoulder region 01/08/2013  . Migraine headache 09/12/2012  . Intermittent sleepiness 09/12/2012   Past Medical History:  Diagnosis Date  . Allergic rhinitis   . Anxiety   . Arthritis    knees and neck  . Cataracts, bilateral    immature   . CHF (congestive heart failure) ([place]) 2014   after surgery  . Complication of anesthesia    had heart failure after knee surgery 02/04/13  . DDD (degenerative disc disease), lumbar   . Diabetes  mellitus without complication ([place])   . Dizziness    takes Antivert prn  . GERD (gastroesophageal reflux disease)    takes Aciphex prn  . High cholesterol    but doesn't take any meds  . History of colon polyps   . History of migraine    last one 6-36months ago;takes Topamax nightly  . Hypertension    takes Metoprolol and maxzide daily  . Joint pain   . Joint swelling   . Migraine   . Migraines   . Pneumonia    at age 54  . Seizures ([place])    hx of;last one 49yrs ago;no meds required;states it was brought on by med;only one ever had  . Shortness of breath 02/06/2013  . Sleep apnea    sleep study in epic from 2014-uses CPAP  . Stroke [facility])    2 TIA  . TIA (transient ischemic attack)     Past Surgical History:  Procedure Laterality Date  . ABDOMINAL HYSTERECTOMY  1991  . carpel tunnel Right 1988  . COLONOSCOPY    . disc surgery  1996  . ESOPHAGOGASTRODUODENOSCOPY    . EYE SURGERY Bilateral    cataract surgery with lens implants  . HERNIA REPAIR  1987   x 2  . KNEE SURGERY Left 2011   replacement-partial  . LUMBAR LAMINECTOMY/DECOMPRESSION MICRODISCECTOMY Right 07/13/2017   Procedure: Disectomy Right L2-3;  Surgeon: Melina Schools, MD;  Location: [facility];  Service: Orthopedics;  Laterality: Right;  2.5 hrs  . ROBOTIC ASSISTED BILATERAL SALPINGO OOPHERECTOMY Bilateral 10/22/2013   Procedure: ROBOTIC ASSISTED BILATERAL SALPINGO OOPHORECTOMY;  Surgeon: Imagene Gurney A. Alycia Rossetti, MD;  Location: [facility];  Service: Gynecology;  Laterality: Bilateral;  . ROTATOR CUFF REPAIR Right   . TOTAL KNEE ARTHROPLASTY Right   . TOTAL KNEE ARTHROPLASTY Right 02/04/2013   Procedure: RIGHT TOTAL KNEE ARTHROPLASTY;  Surgeon: Vickey Huger, MD;  Location: [facility];  Service: Orthopedics;  Laterality: Right;    Current Outpatient Medications  Medication Sig Dispense Refill Last Dose  . aspirin EC 81 MG tablet Take 81 mg by mouth daily.     . Bempedoic Acid-Ezetimibe (NEXLIZET) 180-10 MG TABS Take 1 tablet by mouth  daily. 30 tablet 11   . cetirizine (ZYRTEC) 10 MG tablet Take 10 mg by mouth daily.      . fluticasone (FLONASE) 50 MCG/ACT nasal spray Place 1-2 sprays into both nostrils at bedtime.      . gabapentin (NEURONTIN) 300 MG capsule Take 300 mg by mouth daily as needed for pain.     [name] LORazepam (ATIVAN) 0.5 MG tablet Take 0.25-0.5 mg by mouth daily as needed for anxiety.      . meperidine (DEMEROL) 50 MG tablet Take 50 mg by mouth every 4 (four) hours as needed (migraine).      . metoprolol succinate (TOPROL-XL) 50 MG 24 hr tablet Take 50 mg by mouth daily. Take with or immediately following a meal.     . RABEprazole (ACIPHEX) 20 MG tablet Take 20 mg by mouth daily.     [name] triamterene-hydrochlorothiazide (MAXZIDE) 75-50 MG tablet Take 1 tablet by mouth daily.  1    No current facility-administered medications for this visit.   Allergies  Allergen Reactions  . Codeine Hives  . Statins Other (See Comments)    Muscle pain  . Adhesive [Tape] Rash  . Latex Rash    Social History   Tobacco Use  . Smoking status: Never Smoker  . Smokeless tobacco: Never Used  Substance Use Topics  . Alcohol use: No    Alcohol/week: 0.0 standard drinks    Family History  Problem Relation Age of Onset  . Hypertension Mother   . Pancreatic cancer Mother   . Prostate cancer Other     Review of Systems As stated in HPI  Objective:   Vitals: Ht: 5 ft 5 in 11/15/2019 08:53 am Wt: 217 lbs 11/15/2019 08:55 am BMI: 36.1 11/15/2019 08:55 am BP: 136/81 sitting L arm 11/15/2019 08:58 am Pulse: 71 bpm 11/15/2019 08:58 am  Clinical exam: Patient is alert and oriented 3. No shortness of breath, chest pain.  Psych: Normal mood, affect appropriate  Ambulation: Normal, no assistive devices  Heart: Regular rate and rhythm, no rubs, or gallops  Lungs: Clear to auscultation bilaterally  Abdomen: Soft and nontender. No loss of bowel and bladder control. Bowel sounds 4  Lumbar spine: Increasing back buttock  and neuropathic bilateral thigh pain.  Neuro: Patient is complaining of increasing bilateral neuropathic leg pain Right side worse than leftpredominantly in the anterior thigh (L2 and L3 dermatome.) Right greater than left. Negative straight leg raise test. Ambulating with no assistive devices and normal gait pattern. No clonus, no focal motor deficits in the lower extremity.  Lumbar MRI: completed on 10/24/19 was reviewed with the patient. It was completed at [facility]; I have independently reviewed the images as well as the radiology report. Retrolisthesis at L2-3 with significant degenerative disc disease. Mass-effect on the exiting L2 nerve root and dorsal root ganglion due to severe right foraminal narrowing. Moderate to severe central stenosis at L4-5 mild to moderate degenerative disease at L3-4 and L5-S1. No fracture  or abnormal marrow signal changes noted.  Previous right-sided L2 selective nerve root block gave the patient significant, temporary relief which essentially identified this as her primary pain generator.  Assessment:    Robyn Aguilar is a pleasant 69 year old female with past medical history significant for diabetes (last A1c less than 7), Heart failure, Previous stroke/TIA (on aspirin)Previous L2-3 decompression He will continues to have significant back buttock and recurrent right L2 radicular pain. Imaging studies demonstrate some moderate stenosis at L4-5 but her pain is very similar to what she had prior to her L2-3 decompression 2 years ago. In addition and L2 selective nerve root block provided temporary significant improvement in her pain. Imaging studies now demonstrate recurrent severe spinal stenosis at L2-3 with compression of the exiting L2 nerve root and the dorsal root ganglion. 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 She has had conservative management consisting of physical therapy and injections but unfortunately her quality-of-life continues to deteriorate.   Plan:   L2-3 XLIF with lateral plate  OLIF/XLIF risks, benefits of surgery were reviewed with the patient. These include: infection, bleeding, death, stroke, paralysis, ongoing or worse pain, need for additional surgery, injury to the lumbar plexus resulting in hip flexor weakness and difficulty walking without assistive devices. Adjacent segment degenerative disease, need for additional surgery including fusing other levels, leak of spinal fluid, Nonunion, hardware failure, breakage, or mal-position. Deep venous thrombosis (DVT) requiring additional treatment such as filter, and/or medications. Injury to abdominal contents, loss in bowel and bladder control.  We have obtained preoperative medical clearance from the patient's primary care provider as well as her cardiologist. Her cardiology provider has given the okay to hold aspirin for 7 days prior to surgery and resume 24 hours postoperatively.  I reviewed the patient's medication list with her. We discussed that aspirin as outlined above. The patient is not on any blood thinners. She is not taking any anti-inflammatory medications and she knows not to take any anti-inflammatory medications leading up to surgery. She may take Tylenol as needed for pain.  We have also discussed the post-operative recovery period to include: bathing/showering restrictions, wound healing, activity (and driving) restrictions, medications/pain mangement.  We have also discussed post-operative redflags to include: signs and symptoms of postoperative infection, DVT/PE.  Patient has already been to [facility] for her preoperative testing.  Patient was fitted at today's office visit with an LSO brace.  All of patient's questions were invited and  answered.  Follow-up: 2 weeks postoperative her first postop visit.

## 2019-11-15 NOTE — Progress Notes (Signed)
Anesthesia Chart Review:  Case: [id number] Date/Time: 11/21/19 1458   Procedure: ANTERIOR LATERAL LUMBAR FUSION (XLIF) with lateral plate L2-3 (N/A ) - 3 hrs   Anesthesia type: General   Pre-op diagnosis: Post laminectomy syndrome with recurrent stenosis   [facility]OM 04 / [facility]   Surgeons: Melina Schools, MD      DISCUSSION: Patient is a 69 year old female scheduled for the above procedure.  History includes never smoker, CHF (post-operative acute respiratory failure due to diastolic CHF and HCAP 24/5/80-99/83/38), DM2, HTN, hypercholesterolemia, OSA (CPAP use), GERD, migraines, TIA (versus complex migraine, normal MRI/MRA brain 2004), medication-associated seizure (~ 2010), dizziness, dyspnea, anxiety, adnexal mass (s/p BSO 10/22/13), TKA (right 02/04/13), back surgery (right L23 Gill decompression, foraminotomy, in situ fusion 07/13/17).  BMI is consistent with obesity.  Preoperative cardiology input outlined by Almyra Deforest, [place] on 11/05/19, "Robyn Aguilar was last seen on 03/12/2020 by Dr. [name].  Since that day, Robyn Aguilar has done well without chest pain or shortness of breath. Despite the back pain, she is able to accomplish 4 METS of activity. She has no prior h/o CAD. Last myoview in 2014 was low risk. Recent echo obtained in Nov 2020 showed normal EF, mild LVH but no sign of HOCM, no significant valve issue.   Therefore, based on ACC/AHA guidelines, the patient would be at acceptable risk for the planned procedure without further cardiovascular testing...   She may hold aspirin for 7 days prior to the surgery and restart as soon as possible afterward at the surgeon's discretion."  Cardiologist Dr. [name] was in agreement.   Preoperative COVID-19 test is scheduled for 11/19/2019.  Anesthesia team to evaluate on the day of surgery.   VS: BP 131/74   Pulse 65   Temp 36.6 C (Oral)   Resp 20   Ht 5\' 4"  (1.626 m)   Wt 99.3 kg   SpO2 98%   BMI 37.59 kg/m      PROVIDERS: Harlan Stains, MD is PCP  Daneen Schick, MD is cardiologist [place], Asencion Partridge, MD is neurologist. Last visit 03/27/19 with Ward Givens, NP with one follow-up recommended. Patient did not want to try injectable medications for her migraines due to fear of needles.   LABS: Labs reviewed: Acceptable for surgery. (all labs ordered are listed, but only abnormal results are displayed)  Labs Reviewed  GLUCOSE, CAPILLARY - Abnormal; Notable for the following components:      Result Value   Glucose-Capillary 108 (*)    All other components within normal limits  BASIC METABOLIC PANEL - Abnormal; Notable for the following components:   Glucose, Bld 115 (*)    GFR calc non Af Amer 57 (*)    All other components within normal limits  CBC - Abnormal; Notable for the following components:   WBC 3.7 (*)    RBC 5.15 (*)    All other components within normal limits  HEMOGLOBIN A1C - Abnormal; Notable for the following components:   Hgb A1c MFr Bld 6.9 (*)    All other components within normal limits  SURGICAL PCR SCREEN  APTT  PROTIME-INR  URINALYSIS, ROUTINE W REFLEX MICROSCOPIC  TYPE AND SCREEN    Home Sleep Study 12/19/17: IMPRESSION: Severe OSA is still present at an AHI of over 30/h.  No hypoxia or brady-tachycardia associated.  RECOMMENDATION: The patient qualifies for a new CPAP,  auto-titration capable; with a setting of 5-15 cm water and 3 cm  EPR, heated humidity and mask  of her choice. Please consider a  nasal mask or pillow interface.    IMAGES: CXR 11/14/19: FINDINGS: Lungs are clear. No pneumothorax or pleural effusion. Cardiomediastinal silhouette unremarkable. Cervical fusion hardware is partially visualized. No acute bone abnormality. IMPRESSION: No active cardiopulmonary disease.  US Thyroid 07/10/19: IMPRESSION: 1. Stable cystic nodule in the superior left thyroid lobe. Nodule does not meet criteria for biopsy. 2. No additional thyroid  nodules.   EKG: 03/13/19 ([facility]) Normal sinus rhythm Left axis deviation Possible anterior infarct, age undetermined Abnormal ECG   CV: Echo 03/25/19: IMPRESSIONS  1. Left ventricular ejection fraction, by visual estimation, is 60 to  65%. The left ventricle has normal function. Left ventricular septal wall  thickness was mildly increased. Mildly increased left ventricular  posterior wall thickness. There is mildly  increased left ventricular hypertrophy.  2. No evidece of hypertrophic cardiomyopathy.  3. Global right ventricle has normal systolic function.The right  ventricular size is normal. No increase in right ventricular wall  thickness.  4. Left atrial size was normal.  5. Right atrial size was normal.  6. The mitral valve is normal in structure. Trace mitral valve  regurgitation. No evidence of mitral stenosis.  7. The tricuspid valve is normal in structure. Tricuspid valve  regurgitation is not demonstrated.  8. The aortic valve is tricuspid. Aortic valve regurgitation is not  visualized. No evidence of aortic valve sclerosis or stenosis.  9. The pulmonic valve was normal in structure. Pulmonic valve  regurgitation is mild.  10. The inferior vena cava is normal in size with greater than 50%  respiratory variability, suggesting right atrial pressure of 3 mmHg.   Nuclear stress test 04/03/13: Overall Impression:  Normal stress nuclear study. LV Ejection Fraction: 83%.  LV Wall Motion:  NL LV Function; NL Wall Motion  Cardiac cath 09/20/05: FINAL IMPRESSION:  Normal coronary angiography.  Normal single-plane  ventriculogram.  Stress test was felt to be a false positive.  Dyspnea may  be more related to deconditioning.  The patient does have a slight elevation  in her end-diastolic pressure suggesting some diastolic dysfunction.  Would  recommend aggressive control of her hypertension as well.   Past Medical History:  Diagnosis Date  . Allergic  rhinitis   . Anxiety   . Arthritis    knees and neck  . Cataracts, bilateral    immature   . CHF (congestive heart failure) ([place]) 2014   after surgery  . Complication of anesthesia    had heart failure after knee surgery 02/04/13  . DDD (degenerative disc disease), lumbar   . Diabetes mellitus without complication ([place])   . Dizziness    takes Antivert prn  . GERD (gastroesophageal reflux disease)    takes Aciphex prn  . High cholesterol    but doesn't take any meds  . History of colon polyps   . History of migraine    last one 6-82months ago;takes Topamax nightly  . Hypertension    takes Metoprolol and maxzide daily  . Joint pain   . Joint swelling   . Migraine   . Migraines   . Pneumonia    at age 57  . Seizures ([place])    hx of;last one 83yrs ago;no meds required;states it was brought on by med;only one ever had  . Shortness of breath 02/06/2013  . Sleep apnea    sleep study in epic from 2014-uses CPAP  . Stroke [facility])    2 TIA  . TIA (transient ischemic  attack)     Past Surgical History:  Procedure Laterality Date  . ABDOMINAL HYSTERECTOMY  1991  . carpel tunnel Right 1988  . COLONOSCOPY    . disc surgery  1996  . ESOPHAGOGASTRODUODENOSCOPY    . EYE SURGERY Bilateral    cataract surgery with lens implants  . HERNIA REPAIR  1987   x 2  . KNEE SURGERY Left 2011   replacement-partial  . LUMBAR LAMINECTOMY/DECOMPRESSION MICRODISCECTOMY Right 07/13/2017   Procedure: Disectomy Right L2-3;  Surgeon: Melina Schools, MD;  Location: [place];  Service: Orthopedics;  Laterality: Right;  2.5 hrs  . ROBOTIC ASSISTED BILATERAL SALPINGO OOPHERECTOMY Bilateral 10/22/2013   Procedure: ROBOTIC ASSISTED BILATERAL SALPINGO OOPHORECTOMY;  Surgeon: Imagene Gurney A. Alycia Rossetti, MD;  Location: [facility];  Service: Gynecology;  Laterality: Bilateral;  . ROTATOR CUFF REPAIR Right   . TOTAL KNEE ARTHROPLASTY Right   . TOTAL KNEE ARTHROPLASTY Right 02/04/2013   Procedure: RIGHT TOTAL KNEE ARTHROPLASTY;   Surgeon: Vickey Huger, MD;  Location: [place];  Service: Orthopedics;  Laterality: Right;    MEDICATIONS: . aspirin EC 81 MG tablet  . Bempedoic Acid-Ezetimibe (NEXLIZET) 180-10 MG TABS  . cetirizine (ZYRTEC) 10 MG tablet  . fluticasone (FLONASE) 50 MCG/ACT nasal spray  . gabapentin (NEURONTIN) 300 MG capsule  . LORazepam (ATIVAN) 0.5 MG tablet  . meperidine (DEMEROL) 50 MG tablet  . metoprolol succinate (TOPROL-XL) 50 MG 24 hr tablet  . RABEprazole (ACIPHEX) 20 MG tablet  . triamterene-hydrochlorothiazide (MAXZIDE) 75-50 MG tablet   No current facility-administered medications for this encounter.    Myra Gianotti, PA-C Surgical Short Stay/Anesthesiology [facility] Phone [phone] [facility] Phone [phone] 11/15/2019 1:46 PM

## 2019-11-18 ENCOUNTER — Ambulatory Visit: Payer: Self-pay | Admitting: Orthopedic Surgery

## 2019-11-18 NOTE — Progress Notes (Signed)
Patient called with concerns about being NPO after midnight.  Patient given instructions that she can have clear liquids up until 1200 DOS.  Patient verbalized understanding of clear liquids.

## 2019-11-19 ENCOUNTER — Other Ambulatory Visit (HOSPITAL_COMMUNITY)
Admission: RE | Admit: 2019-11-19 | Discharge: 2019-11-19 | Disposition: A | Discharge status: Home / Self Care | Payer: Medicare PPO | Source: Ambulatory Visit | Attending: Orthopedic Surgery | Admitting: Orthopedic Surgery

## 2019-11-19 DIAGNOSIS — Z01812 Encounter for preprocedural laboratory examination: Secondary | ICD-10-CM | POA: Insufficient documentation

## 2019-11-19 DIAGNOSIS — Z20822 Contact with and (suspected) exposure to covid-19: Secondary | ICD-10-CM | POA: Diagnosis not present

## 2019-11-19 LAB — SARS CORONAVIRUS 2 (TAT 6-24 HRS): SARS Coronavirus 2: NEGATIVE

## 2019-11-21 ENCOUNTER — Ambulatory Visit (HOSPITAL_COMMUNITY): Payer: Medicare PPO | Admitting: Certified Registered"

## 2019-11-21 ENCOUNTER — Ambulatory Visit (HOSPITAL_COMMUNITY)
Admission: RE | Disposition: A | Discharge status: Home / Self Care | Payer: Self-pay | Source: Ambulatory Visit | Attending: Orthopedic Surgery

## 2019-11-21 ENCOUNTER — Ambulatory Visit (HOSPITAL_COMMUNITY): Payer: Medicare PPO

## 2019-11-21 ENCOUNTER — Ambulatory Visit (HOSPITAL_COMMUNITY): Payer: Medicare PPO | Admitting: Vascular Surgery

## 2019-11-21 ENCOUNTER — Other Ambulatory Visit: Payer: Self-pay

## 2019-11-21 ENCOUNTER — Observation Stay (HOSPITAL_COMMUNITY)
Admission: RE | Admit: 2019-11-21 | Discharge: 2019-11-24 | Disposition: A | Discharge status: Home / Self Care | Payer: Medicare PPO | Source: Ambulatory Visit | Attending: Orthopedic Surgery | Admitting: Orthopedic Surgery

## 2019-11-21 DIAGNOSIS — I5032 Chronic diastolic (congestive) heart failure: Secondary | ICD-10-CM | POA: Insufficient documentation

## 2019-11-21 DIAGNOSIS — Z419 Encounter for procedure for purposes other than remedying health state, unspecified: Secondary | ICD-10-CM

## 2019-11-21 DIAGNOSIS — M960 Pseudarthrosis after fusion or arthrodesis: Secondary | ICD-10-CM | POA: Diagnosis not present

## 2019-11-21 DIAGNOSIS — M5126 Other intervertebral disc displacement, lumbar region: Principal | ICD-10-CM | POA: Diagnosis present

## 2019-11-21 DIAGNOSIS — Z7982 Long term (current) use of aspirin: Secondary | ICD-10-CM | POA: Diagnosis not present

## 2019-11-21 DIAGNOSIS — Z4802 Encounter for removal of sutures: Secondary | ICD-10-CM | POA: Diagnosis not present

## 2019-11-21 DIAGNOSIS — E119 Type 2 diabetes mellitus without complications: Secondary | ICD-10-CM | POA: Insufficient documentation

## 2019-11-21 DIAGNOSIS — M961 Postlaminectomy syndrome, not elsewhere classified: Secondary | ICD-10-CM | POA: Diagnosis not present

## 2019-11-21 DIAGNOSIS — Z96651 Presence of right artificial knee joint: Secondary | ICD-10-CM | POA: Diagnosis not present

## 2019-11-21 DIAGNOSIS — Z91040 Latex allergy status: Secondary | ICD-10-CM | POA: Insufficient documentation

## 2019-11-21 DIAGNOSIS — M48061 Spinal stenosis, lumbar region without neurogenic claudication: Secondary | ICD-10-CM | POA: Diagnosis not present

## 2019-11-21 DIAGNOSIS — Z86010 Personal history of colonic polyps: Secondary | ICD-10-CM | POA: Diagnosis not present

## 2019-11-21 DIAGNOSIS — I110 Hypertensive heart disease with heart failure: Secondary | ICD-10-CM | POA: Diagnosis not present

## 2019-11-21 DIAGNOSIS — E785 Hyperlipidemia, unspecified: Secondary | ICD-10-CM | POA: Diagnosis not present

## 2019-11-21 DIAGNOSIS — M4326 Fusion of spine, lumbar region: Secondary | ICD-10-CM | POA: Diagnosis not present

## 2019-11-21 DIAGNOSIS — G43909 Migraine, unspecified, not intractable, without status migrainosus: Secondary | ICD-10-CM | POA: Diagnosis not present

## 2019-11-21 DIAGNOSIS — M5116 Intervertebral disc disorders with radiculopathy, lumbar region: Secondary | ICD-10-CM | POA: Diagnosis not present

## 2019-11-21 DIAGNOSIS — M5136 Other intervertebral disc degeneration, lumbar region: Secondary | ICD-10-CM | POA: Diagnosis not present

## 2019-11-21 HISTORY — PX: ANTERIOR LAT LUMBAR FUSION: SHX1168

## 2019-11-21 LAB — GLUCOSE, CAPILLARY
Glucose-Capillary: 117 mg/dL — ABNORMAL HIGH (ref 70–99)
Glucose-Capillary: 92 mg/dL (ref 70–99)
Glucose-Capillary: 131 mg/dL — ABNORMAL HIGH (ref 70–99)

## 2019-11-21 SURGERY — ANTERIOR LATERAL LUMBAR FUSION 1 LEVEL
Anesthesia: General

## 2019-11-21 MED ORDER — ONDANSETRON HCL 4 MG PO TABS: 4.0000 mg | ORAL_TABLET | Freq: Four times a day (QID) | ORAL | Status: DC | PRN

## 2019-11-21 MED ORDER — METHOCARBAMOL 500 MG PO TABS: 500.0000 mg | ORAL_TABLET | Freq: Three times a day (TID) | ORAL | 0 refills | Status: AC | PRN

## 2019-11-21 MED ORDER — OXYCODONE HCL 5 MG/5ML PO SOLN: 5.0000 mg | Freq: Once | ORAL | Status: AC | PRN

## 2019-11-21 MED ORDER — POLYETHYLENE GLYCOL 3350 17 G PO PACK
17.0000 g | PACK | Freq: Every day | ORAL | Status: DC | PRN
  Administered 2019-11-22: 17 g via ORAL
  Filled 2019-11-21: qty 1

## 2019-11-21 MED ORDER — ONDANSETRON HCL 4 MG/2ML IJ SOLN
INTRAMUSCULAR | Status: DC | PRN
  Administered 2019-11-21: 4 mg via INTRAVENOUS

## 2019-11-21 MED ORDER — PROPOFOL 500 MG/50ML IV EMUL
INTRAVENOUS | Status: DC | PRN
Start: 2019-11-21 — End: 2019-11-21
  Administered 2019-11-21: 50 ug/kg/min via INTRAVENOUS

## 2019-11-21 MED ORDER — METHOCARBAMOL 1000 MG/10ML IJ SOLN
500.0000 mg | Freq: Four times a day (QID) | INTRAVENOUS | Status: DC | PRN
  Administered 2019-11-22: 500 mg via INTRAVENOUS
  Filled 2019-11-21 (×2): qty 5

## 2019-11-21 MED ORDER — ORAL CARE MOUTH RINSE: 15.0000 mL | Freq: Once | OROMUCOSAL | Status: AC

## 2019-11-21 MED ORDER — OXYCODONE-ACETAMINOPHEN 10-325 MG PO TABS: 1.0000 | ORAL_TABLET | Freq: Four times a day (QID) | ORAL | 0 refills | Status: AC | PRN

## 2019-11-21 MED ORDER — ONDANSETRON HCL 4 MG/2ML IJ SOLN
4.0000 mg | Freq: Four times a day (QID) | INTRAMUSCULAR | Status: DC | PRN
  Administered 2019-11-21 – 2019-11-22 (×2): 4 mg via INTRAVENOUS
  Filled 2019-11-21 (×2): qty 2

## 2019-11-21 MED ORDER — BUPIVACAINE-EPINEPHRINE 0.25% -1:200000 IJ SOLN
INTRAMUSCULAR | Status: DC | PRN
  Administered 2019-11-21: 10 mL

## 2019-11-21 MED ORDER — FENTANYL CITRATE (PF) 100 MCG/2ML IJ SOLN
INTRAMUSCULAR | Status: AC
  Filled 2019-11-21: qty 2

## 2019-11-21 MED ORDER — OXYCODONE HCL 5 MG PO TABS
5.0000 mg | ORAL_TABLET | ORAL | Status: DC | PRN
  Administered 2019-11-22 – 2019-11-24 (×8): 5 mg via ORAL
  Filled 2019-11-21 (×7): qty 1

## 2019-11-21 MED ORDER — GABAPENTIN 300 MG PO CAPS
300.0000 mg | ORAL_CAPSULE | Freq: Three times a day (TID) | ORAL | Status: DC
  Administered 2019-11-21 – 2019-11-24 (×7): 300 mg via ORAL
  Filled 2019-11-21 (×4): qty 1
  Filled 2019-11-21: qty 3
  Filled 2019-11-21 (×3): qty 1

## 2019-11-21 MED ORDER — EPHEDRINE SULFATE 50 MG/ML IJ SOLN
INTRAMUSCULAR | Status: DC | PRN
Start: 2019-11-21 — End: 2019-11-21
  Administered 2019-11-21: 10 mg via INTRAVENOUS

## 2019-11-21 MED ORDER — LACTATED RINGERS IV SOLN: INTRAVENOUS | Status: DC

## 2019-11-21 MED ORDER — METOPROLOL SUCCINATE ER 50 MG PO TB24
50.0000 mg | ORAL_TABLET | Freq: Every day | ORAL | Status: DC
  Administered 2019-11-22 – 2019-11-24 (×3): 50 mg via ORAL
  Filled 2019-11-21 (×3): qty 1

## 2019-11-21 MED ORDER — ROCURONIUM BROMIDE 10 MG/ML (PF) SYRINGE
PREFILLED_SYRINGE | INTRAVENOUS | Status: DC | PRN
  Administered 2019-11-21: 50 mg via INTRAVENOUS

## 2019-11-21 MED ORDER — FLUTICASONE PROPIONATE 50 MCG/ACT NA SUSP
1.0000 | Freq: Every day | NASAL | Status: DC
  Administered 2019-11-22 – 2019-11-23 (×2): 2 via NASAL
  Filled 2019-11-21: qty 16

## 2019-11-21 MED ORDER — MENTHOL 3 MG MT LOZG: 1.0000 | LOZENGE | OROMUCOSAL | Status: DC | PRN

## 2019-11-21 MED ORDER — DOCUSATE SODIUM 100 MG PO CAPS
100.0000 mg | ORAL_CAPSULE | Freq: Two times a day (BID) | ORAL | Status: DC
  Administered 2019-11-21 – 2019-11-24 (×6): 100 mg via ORAL
  Filled 2019-11-21 (×6): qty 1

## 2019-11-21 MED ORDER — THROMBIN (RECOMBINANT) 20000 UNITS EX SOLR
CUTANEOUS | Status: AC
  Filled 2019-11-21: qty 20000

## 2019-11-21 MED ORDER — OXYCODONE HCL 5 MG PO TABS
10.0000 mg | ORAL_TABLET | ORAL | Status: DC | PRN
  Administered 2019-11-21: 10 mg via ORAL
  Administered 2019-11-22: 5 mg via ORAL
  Filled 2019-11-21 (×3): qty 2

## 2019-11-21 MED ORDER — BEMPEDOIC ACID-EZETIMIBE 180-10 MG PO TABS: 1.0000 | ORAL_TABLET | Freq: Every day | ORAL | Status: DC

## 2019-11-21 MED ORDER — METHOCARBAMOL 500 MG PO TABS
500.0000 mg | ORAL_TABLET | Freq: Four times a day (QID) | ORAL | Status: DC | PRN
  Administered 2019-11-21 – 2019-11-23 (×5): 500 mg via ORAL
  Filled 2019-11-21 (×5): qty 1

## 2019-11-21 MED ORDER — FENTANYL CITRATE (PF) 250 MCG/5ML IJ SOLN
INTRAMUSCULAR | Status: AC
  Filled 2019-11-21: qty 5

## 2019-11-21 MED ORDER — CHLORHEXIDINE GLUCONATE 0.12 % MT SOLN: 15.0000 mL | Freq: Once | OROMUCOSAL | Status: AC

## 2019-11-21 MED ORDER — HEMOSTATIC AGENTS (NO CHARGE) OPTIME
TOPICAL | Status: DC | PRN
  Administered 2019-11-21: 1 via TOPICAL

## 2019-11-21 MED ORDER — ONDANSETRON HCL 4 MG/2ML IJ SOLN: 4.0000 mg | Freq: Once | INTRAMUSCULAR | Status: DC | PRN

## 2019-11-21 MED ORDER — SODIUM CHLORIDE 0.9 % IV SOLN: 250.0000 mL | INTRAVENOUS | Status: DC

## 2019-11-21 MED ORDER — PHENYLEPHRINE HCL-NACL 10-0.9 MG/250ML-% IV SOLN
INTRAVENOUS | Status: DC | PRN
Start: 2019-11-21 — End: 2019-11-21
  Administered 2019-11-21: 40 ug/min via INTRAVENOUS

## 2019-11-21 MED ORDER — EPINEPHRINE PF 1 MG/ML IJ SOLN
INTRAMUSCULAR | Status: DC | PRN
  Administered 2019-11-21: 1 mg

## 2019-11-21 MED ORDER — CEFAZOLIN SODIUM-DEXTROSE 2-4 GM/100ML-% IV SOLN
INTRAVENOUS | Status: AC
  Filled 2019-11-21: qty 100

## 2019-11-21 MED ORDER — SODIUM CHLORIDE 0.9% FLUSH: 3.0000 mL | INTRAVENOUS | Status: DC | PRN

## 2019-11-21 MED ORDER — ACETAMINOPHEN 325 MG PO TABS
650.0000 mg | ORAL_TABLET | ORAL | Status: DC | PRN
  Administered 2019-11-22 – 2019-11-23 (×3): 650 mg via ORAL
  Filled 2019-11-21 (×3): qty 2

## 2019-11-21 MED ORDER — SUGAMMADEX SODIUM 200 MG/2ML IV SOLN
INTRAVENOUS | Status: DC | PRN
Start: 2019-11-21 — End: 2019-11-21
  Administered 2019-11-21: 200 mg via INTRAVENOUS

## 2019-11-21 MED ORDER — FLEET ENEMA 7-19 GM/118ML RE ENEM
1.0000 | ENEMA | Freq: Once | RECTAL | Status: AC | PRN
  Administered 2019-11-23: 1 via RECTAL
  Filled 2019-11-21: qty 1

## 2019-11-21 MED ORDER — PHENOL 1.4 % MT LIQD
1.0000 | OROMUCOSAL | Status: DC | PRN
  Filled 2019-11-21: qty 177

## 2019-11-21 MED ORDER — CEFAZOLIN SODIUM-DEXTROSE 1-4 GM/50ML-% IV SOLN
1.0000 g | Freq: Three times a day (TID) | INTRAVENOUS | Status: AC
  Administered 2019-11-21 – 2019-11-22 (×2): 1 g via INTRAVENOUS
  Filled 2019-11-21 (×2): qty 50

## 2019-11-21 MED ORDER — FENTANYL CITRATE (PF) 100 MCG/2ML IJ SOLN
25.0000 ug | INTRAMUSCULAR | Status: DC | PRN
  Administered 2019-11-21 (×3): 25 ug via INTRAVENOUS
  Administered 2019-11-21: 50 ug via INTRAVENOUS

## 2019-11-21 MED ORDER — OXYCODONE HCL 5 MG PO TABS
5.0000 mg | ORAL_TABLET | Freq: Once | ORAL | Status: AC | PRN
  Administered 2019-11-21: 5 mg via ORAL

## 2019-11-21 MED ORDER — LORAZEPAM 0.5 MG PO TABS
0.2500 mg | ORAL_TABLET | Freq: Every day | ORAL | Status: DC | PRN
  Administered 2019-11-22: 0.25 mg via ORAL
  Filled 2019-11-21: qty 1

## 2019-11-21 MED ORDER — MIDAZOLAM HCL 2 MG/2ML IJ SOLN
INTRAMUSCULAR | Status: AC
  Filled 2019-11-21: qty 2

## 2019-11-21 MED ORDER — PHENYLEPHRINE 40 MCG/ML (10ML) SYRINGE FOR IV PUSH (FOR BLOOD PRESSURE SUPPORT)
PREFILLED_SYRINGE | INTRAVENOUS | Status: DC | PRN
  Administered 2019-11-21 (×3): 80 ug via INTRAVENOUS
  Administered 2019-11-21 (×2): 120 ug via INTRAVENOUS

## 2019-11-21 MED ORDER — EPINEPHRINE PF 1 MG/ML IJ SOLN
INTRAMUSCULAR | Status: AC
  Filled 2019-11-21: qty 1

## 2019-11-21 MED ORDER — PROPOFOL 10 MG/ML IV BOLUS
INTRAVENOUS | Status: DC | PRN
  Administered 2019-11-21: 150 mg via INTRAVENOUS

## 2019-11-21 MED ORDER — ONDANSETRON HCL 4 MG PO TABS: 4.0000 mg | ORAL_TABLET | Freq: Three times a day (TID) | ORAL | 0 refills | Status: DC | PRN

## 2019-11-21 MED ORDER — LIDOCAINE 2% (20 MG/ML) 5 ML SYRINGE
INTRAMUSCULAR | Status: DC | PRN
  Administered 2019-11-21: 60 mg via INTRAVENOUS

## 2019-11-21 MED ORDER — CEFAZOLIN SODIUM-DEXTROSE 2-4 GM/100ML-% IV SOLN
2.0000 g | INTRAVENOUS | Status: AC
  Administered 2019-11-21: 2 g via INTRAVENOUS

## 2019-11-21 MED ORDER — CHLORHEXIDINE GLUCONATE 0.12 % MT SOLN
OROMUCOSAL | Status: AC
  Administered 2019-11-21: 15 mL via OROMUCOSAL
  Filled 2019-11-21: qty 15

## 2019-11-21 MED ORDER — BUPIVACAINE HCL (PF) 0.25 % IJ SOLN
INTRAMUSCULAR | Status: AC
  Filled 2019-11-21: qty 30

## 2019-11-21 MED ORDER — THROMBIN 20000 UNITS EX SOLR
CUTANEOUS | Status: DC | PRN
  Administered 2019-11-21: 20 mL via TOPICAL

## 2019-11-21 MED ORDER — OXYCODONE HCL 5 MG PO TABS
ORAL_TABLET | ORAL | Status: AC
  Filled 2019-11-21: qty 1

## 2019-11-21 MED ORDER — SODIUM CHLORIDE 0.9% FLUSH
3.0000 mL | Freq: Two times a day (BID) | INTRAVENOUS | Status: DC
  Administered 2019-11-22 (×2): 3 mL via INTRAVENOUS

## 2019-11-21 MED ORDER — ACETAMINOPHEN 650 MG RE SUPP: 650.0000 mg | RECTAL | Status: DC | PRN

## 2019-11-21 MED ORDER — METHOCARBAMOL 500 MG PO TABS
ORAL_TABLET | ORAL | Status: AC
  Filled 2019-11-21: qty 1

## 2019-11-21 MED ORDER — DEXAMETHASONE SODIUM PHOSPHATE 10 MG/ML IJ SOLN
INTRAMUSCULAR | Status: DC | PRN
  Administered 2019-11-21: 4 mg via INTRAVENOUS

## 2019-11-21 MED ORDER — FENTANYL CITRATE (PF) 250 MCG/5ML IJ SOLN
INTRAMUSCULAR | Status: DC | PRN
  Administered 2019-11-21 (×3): 50 ug via INTRAVENOUS
  Administered 2019-11-21: 100 ug via INTRAVENOUS
  Administered 2019-11-21 (×2): 50 ug via INTRAVENOUS

## 2019-11-21 MED ORDER — MIDAZOLAM HCL 5 MG/5ML IJ SOLN
INTRAMUSCULAR | Status: DC | PRN
  Administered 2019-11-21: 2 mg via INTRAVENOUS

## 2019-11-21 MED ORDER — TRIAMTERENE-HCTZ 75-50 MG PO TABS
1.0000 | ORAL_TABLET | Freq: Every day | ORAL | Status: DC
  Administered 2019-11-22 – 2019-11-24 (×2): 1 via ORAL
  Filled 2019-11-21 (×3): qty 1

## 2019-11-21 MED ORDER — 0.9 % SODIUM CHLORIDE (POUR BTL) OPTIME
TOPICAL | Status: DC | PRN
  Administered 2019-11-21 (×2): 1000 mL

## 2019-11-21 SURGICAL SUPPLY — 66 items
BLADE CLIPPER SURG (BLADE) IMPLANT
BLADE SURG 10 STRL SS (BLADE) ×2 IMPLANT
BOLT SPNL LRG 45X5.5XPLAT NS (Screw) ×1 IMPLANT
BONE MATRIX OSTEOCEL PRO MED (Bone Implant) ×2 IMPLANT
CAGE MODULUS XLW 12X22X45 - 10 (Cage) ×2 IMPLANT
CATH FOLEY 2WAY SLVR  5CC 16FR (CATHETERS) ×2
CATH FOLEY 2WAY SLVR 5CC 16FR (CATHETERS) ×1 IMPLANT
CLSR STERI-STRIP ANTIMIC 1/2X4 (GAUZE/BANDAGES/DRESSINGS) ×2 IMPLANT
COVER SURGICAL LIGHT HANDLE (MISCELLANEOUS) ×2 IMPLANT
COVER WAND RF STERILE (DRAPES) ×2 IMPLANT
DRAPE C-ARM 42X72 X-RAY (DRAPES) ×2 IMPLANT
DRAPE C-ARMOR (DRAPES) ×2 IMPLANT
DRAPE INCISE IOBAN 66X45 STRL (DRAPES) IMPLANT
DRSG OPSITE POSTOP 3X4 (GAUZE/BANDAGES/DRESSINGS) ×2 IMPLANT
DRSG OPSITE POSTOP 4X6 (GAUZE/BANDAGES/DRESSINGS) ×2 IMPLANT
DURAPREP 26ML APPLICATOR (WOUND CARE) ×2 IMPLANT
ELECT BLADE 4.0 EZ CLEAN MEGAD (MISCELLANEOUS) ×2
ELECT PENCIL ROCKER SW 15FT (MISCELLANEOUS) ×2 IMPLANT
ELECT REM PT RETURN 9FT ADLT (ELECTROSURGICAL) ×2
ELECTRODE BLDE 4.0 EZ CLN MEGD (MISCELLANEOUS) ×1 IMPLANT
ELECTRODE REM PT RTRN 9FT ADLT (ELECTROSURGICAL) ×1 IMPLANT
GLOVE BIO SURGEON STRL SZ 6.5 (GLOVE) ×2 IMPLANT
GLOVE BIOGEL PI IND STRL 6.5 (GLOVE) ×1 IMPLANT
GLOVE BIOGEL PI IND STRL 8.5 (GLOVE) ×1 IMPLANT
GLOVE BIOGEL PI INDICATOR 6.5 (GLOVE) ×1
GLOVE BIOGEL PI INDICATOR 8.5 (GLOVE) ×1
GLOVE SS BIOGEL STRL SZ 8.5 (GLOVE) ×1 IMPLANT
GLOVE SUPERSENSE BIOGEL SZ 8.5 (GLOVE) ×1
GOWN STRL REUS W/ TWL LRG LVL3 (GOWN DISPOSABLE) ×2 IMPLANT
GOWN STRL REUS W/ TWL XL LVL3 (GOWN DISPOSABLE) ×2 IMPLANT
GOWN STRL REUS W/TWL 2XL LVL3 (GOWN DISPOSABLE) ×4 IMPLANT
GOWN STRL REUS W/TWL LRG LVL3 (GOWN DISPOSABLE) ×4
GOWN STRL REUS W/TWL XL LVL3 (GOWN DISPOSABLE) ×4
KIT BASIN OR (CUSTOM PROCEDURE TRAY) ×2 IMPLANT
KIT DILATOR XLIF 5 (KITS) ×1 IMPLANT
KIT SURGICAL ACCESS MAXCESS 4 (KITS) ×2 IMPLANT
KIT TURNOVER KIT B (KITS) ×2 IMPLANT
KIT XLIF (KITS) ×1
MODULE EMG NEEDLE SSEP NVM5 (NEEDLE) ×2 IMPLANT
MODULE NVM5 NEXT GEN EMG (NEEDLE) ×2 IMPLANT
NEEDLE 22X1 1/2 (OR ONLY) (NEEDLE) ×2 IMPLANT
NEEDLE SPNL 18GX3.5 QUINCKE PK (NEEDLE) ×2 IMPLANT
NS IRRIG 1000ML POUR BTL (IV SOLUTION) ×2 IMPLANT
PACK LAMINECTOMY ORTHO (CUSTOM PROCEDURE TRAY) ×2 IMPLANT
PACK UNIVERSAL I (CUSTOM PROCEDURE TRAY) ×2 IMPLANT
PAD ARMBOARD 7.5X6 YLW CONV (MISCELLANEOUS) ×4 IMPLANT
PLATE DECADE XLIP 2H SZ12 (Plate) ×2 IMPLANT
SCREW 45MM (Screw) ×2 IMPLANT
SCREW DECADE 5.5X50 (Screw) ×2 IMPLANT
SPONGE LAP 4X18 RFD (DISPOSABLE) ×2 IMPLANT
SPONGE SURGIFOAM ABS GEL 100 (HEMOSTASIS) ×2 IMPLANT
STAPLER VISISTAT 35W (STAPLE) ×2 IMPLANT
SURGIFLO W/THROMBIN 8M KIT (HEMOSTASIS) ×2 IMPLANT
SUT BONE WAX W31G (SUTURE) IMPLANT
SUT MNCRL AB 3-0 PS2 27 (SUTURE) ×4 IMPLANT
SUT MON AB 3-0 SH 27 (SUTURE) ×2
SUT MON AB 3-0 SH27 (SUTURE) ×1 IMPLANT
SUT VIC AB 1 CT1 18XCR BRD 8 (SUTURE) ×1 IMPLANT
SUT VIC AB 1 CT1 8-18 (SUTURE) ×2
SUT VIC AB 2-0 CT1 18 (SUTURE) ×4 IMPLANT
SYR BULB IRRIG 60ML STRL (SYRINGE) ×2 IMPLANT
TAPE CLOTH 4X10 WHT NS (GAUZE/BANDAGES/DRESSINGS) ×2 IMPLANT
TOWEL GREEN STERILE (TOWEL DISPOSABLE) ×2 IMPLANT
TOWEL GREEN STERILE FF (TOWEL DISPOSABLE) ×2 IMPLANT
WATER STERILE IRR 1000ML POUR (IV SOLUTION) ×2 IMPLANT
YANKAUER SUCT BULB TIP NO VENT (SUCTIONS) ×2 IMPLANT

## 2019-11-21 NOTE — Brief Op Note (Signed)
11/21/2019  5:10 PM  PATIENT:  Robyn Aguilar  69 y.o. female  PRE-OPERATIVE DIAGNOSIS:  Post laminectomy syndrome with recurrent stenosis  POST-OPERATIVE DIAGNOSIS:  Post laminectomy syndrome with recurrent stenosis  PROCEDURE:  Procedure(s) with comments: ANTERIOR LATERAL LUMBAR FUSION (XLIF) with lateral plate Lumbar two through lumbar three (N/A) - 3 hrs  SURGEON:  Surgeon(s) and Role:    Melina Schools, MD - Primary  PHYSICIAN ASSISTANT:   ASSISTANTS: Manda Ward, PA Ms. asymmetrical chocolate cake for 69 year old just  ANESTHESIA:   general  EBL:  50 mL   BLOOD ADMINISTERED:none  DRAINS: none   LOCAL MEDICATIONS USED:  MARCAINE     SPECIMEN:  No Specimen  DISPOSITION OF SPECIMEN:  N/A  COUNTS:  YES  TOURNIQUET:  * No tourniquets in log *  DICTATION: .Dragon Dictation  PLAN OF CARE: Admit to inpatient   PATIENT DISPOSITION:  PACU - hemodynamically stable.

## 2019-11-21 NOTE — H&P (Signed)
Addendum H&P  Patient continues to have severe back buttock and anterior groin pain.  Despite appropriate conservative management her quality of life continues to suffer.  There is been no change in her clinical exam since her office visit of 11/15/2019.  Plan on moving forward with the lateral interbody fusion L2-3.

## 2019-11-21 NOTE — Op Note (Signed)
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  Application of independent lateral plate X7-9  Complications: None  Condition: Stable  First Assistant: Amanda Ward, PA  EBL: 50 cc  Implants: NuVasive titanium intervertebral cage.  12 x 22 x 45.  10 degree lordosis.  12 mm 2-hold lateral plate affixed with one 6.5 x 45 mm length screw and one 6.5 x 50 mm screw  Neuro monitoring: No adverse free running EMG or SSEP activity noted throughout the case.  Indications: Robyn Aguilar is a very pleasant 69 year old man who had a previous posterior L2-3 decompression who has ongoing discogenic back pain.  Attempted conservative management had failed to alleviate her pain and improve her quality of life.  As result we elected to move forward with surgery.  All appropriate risks benefits and alternatives were discussed with the patient and consent was obtained.  Operative report patient is brought the operating room placed upon the operating room table.  After successful induction of general anesthesia and endotracheal ovation teds SCDs were applied and the neuro monitoring representative applied all appropriate needles and pads.  Patient was turned to the lateral decubitus position left side up.  Axillary roll was placed all bony prominences were well-padded and the arm was placed in the well arm holder.  The patient was secured to the table with tape in order to prevent motion.  I confirmed that the L2-3 disc space was well visualized and properly positioned in both the AP and lateral planes.  The flank was then prepped and draped in a standard fashion.  Timeout was taken to confirm patient procedure and all important data.  Using fluoroscopy identified the L2-3 disc space and the anterior and posterior margins.  I marked out my  incision site and infiltrated with quarter percent Marcaine with epinephrine.  Incision was made and sharp dissection was carried out down to the deep fascia.  The fascia of the external oblique was now visible.  A second small incision was made 1 fingerbreadth laterally and I bluntly dissected down to the posterior aspect of the retroperitoneal fascia.  I advanced into the retroperitoneal fascia and bluntly dissected the soft tissue.  I then dissected through the undersurface of the external oblique until I could see my finger in the first incision.  I then advanced the first dilator down to the lateral surface of the psoas.  I confirmed satisfactory position with fluoroscopy and secured into the disc space with a guidepin.  I circumferentially stimulated to ensure there was no trauma to the plexus.  I then sequentially dilated up stimulating circumferentially with each pass.  The final working retractor was placed and I confirmed satisfactory positioning in the AP and lateral planes.  I then stimulated behind each of the blades confirming I was not traumatizing the plexus.  I secured the posterior blade to the disc space with a shim and then advanced the lateral blades forward until I had excellent visualization of the lateral aspect of the L2-3 disc space.  Annulotomy was performed with a 10 blade scalpel.  Posterior tome was used to release the disc space and the contralateral annulus.  I then used a box osteotome to remove the bulk of the disc material.  I then used curettes and pituitary rongeurs to remove all the disc material.  I used the side scraping curettes to remove the remaining  portion of cartilage from the endplate and expose the bleeding subchondral bone.  Once this was completed I then used the trial implants and elected to use the size 12 lordotic cage.  This provided the best overall fit.  The cage was obtained and packed with the osteocell and then malleted into position.  The cage was secured  in the area and imaging studies confirmed satisfactory position in both the AP and lateral planes.  The 12 mm lateral plate was then applied and the temporary locking pin was used to secure it to the body of L2.  The awl was advanced through the plate into the L3 vertebral body.  The appropriate size screw was then obtained and placed.  I repeated this at the L2 level.  Both screws were finally torqued down and locked in place.  They had excellent purchase and were secured.  I then torqued the locking knot according manufacture standards.  The band was then brought back into the neutral position and I locked the plate in place.  I irrigated the wound copiously with normal saline and then obtained hemostasis using bipolar cautery and FloSeal.  I then remove the retractor and closed the fascia of the external oblique with interrupted #1 Vicryl sutures.  The posterior wound was also irrigated and closed in layered fashion with interrupted #1 Vicryl suture, 2-0 Vicryl suture, and a 3-0 Monocryl.  The lateral incision was then closed in layered fashion with 2-0 Vicryl suture and 3-0 Monocryl.  Steri-Strips and dry dressings were applied and the patient was extubated transfer the PACU without incident.  The end of the case all needle and sponge counts were complete.

## 2019-11-21 NOTE — Anesthesia Procedure Notes (Signed)
Procedure Name: Intubation Date/Time: 11/21/2019 2:53 PM Performed by: Amadeo Garnet, CRNA Pre-anesthesia Checklist: Patient identified, Emergency Drugs available, Suction available and Patient being monitored Patient Re-evaluated:Patient Re-evaluated prior to induction Oxygen Delivery Method: Circle system utilized Preoxygenation: Pre-oxygenation with 100% oxygen Induction Type: IV induction Ventilation: Mask ventilation without difficulty Laryngoscope Size: Mac and 4 Grade View: Grade I Tube type: Oral Tube size: 7.0 mm Number of attempts: 1 Airway Equipment and Method: Stylet Placement Confirmation: ETT inserted through vocal cords under direct vision,  positive ETCO2 and breath sounds checked- equal and bilateral Secured at: 22 cm Tube secured with: Tape Dental Injury: Teeth and Oropharynx as per pre-operative assessment

## 2019-11-21 NOTE — Transfer of Care (Signed)
Immediate Anesthesia Transfer of Care Note  Patient: Robyn Aguilar  Procedure(s) Performed: ANTERIOR LATERAL LUMBAR FUSION (XLIF) with lateral plate Lumbar two through lumbar three (N/A )  Patient Location: PACU  Anesthesia Type:General  Level of Consciousness: awake, alert  and oriented  Airway & Oxygen Therapy: Patient Spontanous Breathing and Patient connected to face mask oxygen  Post-op Assessment: Report given to RN, Post -op Vital signs reviewed and stable and Patient moving all extremities  Post vital signs: Reviewed and stable  Last Vitals:  Vitals Value Taken Time  BP    Temp    Pulse    Resp    SpO2      Last Pain:  Vitals:   11/21/19 1409  TempSrc:   PainSc: 0-No pain         Complications: No complications documented.

## 2019-11-21 NOTE — Anesthesia Postprocedure Evaluation (Signed)
Anesthesia Post Note  Patient: Robyn Aguilar  Procedure(s) Performed: ANTERIOR LATERAL LUMBAR FUSION (XLIF) with lateral plate Lumbar two through lumbar three (N/A )     Patient location during evaluation: PACU Anesthesia Type: General Level of consciousness: awake and alert Pain management: pain level controlled Vital Signs Assessment: post-procedure vital signs reviewed and stable Respiratory status: spontaneous breathing, nonlabored ventilation, respiratory function stable and patient connected to nasal cannula oxygen Cardiovascular status: blood pressure returned to baseline and stable Postop Assessment: no apparent nausea or vomiting Anesthetic complications: no   No complications documented.  Last Vitals:  Vitals:   11/21/19 1915 11/21/19 1958  BP:  (!) 134/82  Pulse:  87  Resp:  18  Temp: 36.9 C 36.8 C  SpO2:  100%    Last Pain:  Vitals:   11/21/19 1958  TempSrc: Oral  PainSc:                  Catalina Gravel

## 2019-11-21 NOTE — OR Nursing (Signed)
Per protocol Dr. Lockie Mola called on 11/21/2019 at 1716 and confirmed there was no instrumentation or foreign body retained.

## 2019-11-22 ENCOUNTER — Encounter (HOSPITAL_COMMUNITY): Payer: Self-pay | Admitting: Orthopedic Surgery

## 2019-11-22 LAB — GLUCOSE, CAPILLARY
Glucose-Capillary: 136 mg/dL — ABNORMAL HIGH (ref 70–99)
Glucose-Capillary: 131 mg/dL — ABNORMAL HIGH (ref 70–99)
Glucose-Capillary: 129 mg/dL — ABNORMAL HIGH (ref 70–99)
Glucose-Capillary: 156 mg/dL — ABNORMAL HIGH (ref 70–99)

## 2019-11-22 MED ORDER — SENNA 8.6 MG PO TABS
1.0000 | ORAL_TABLET | Freq: Two times a day (BID) | ORAL | Status: DC | PRN
  Administered 2019-11-22: 8.6 mg via ORAL
  Filled 2019-11-22: qty 1

## 2019-11-22 MED FILL — Thrombin (Recombinant) For Soln 20000 Unit: CUTANEOUS | Qty: 1 | Status: AC

## 2019-11-22 NOTE — Evaluation (Signed)
Physical Therapy Evaluation Patient Details Name: Robyn Aguilar MRN: [id number] DOB: 08-14-1950 Today's Date: 11/22/2019   History of Present Illness  Pt is a 69 y/o female who presents s/p XLIF L2-L3 on 11/21/2019. PMH significant for CVA, seizures, migraines, HTN, SM, DDD, CHF, cataracts, R TKR 2014, R rotator cuff repair, prior back surgery 2019, partial L knee replacement 2011.  Clinical Impression  Pt admitted with above diagnosis. At the time of PT eval, pt was able to demonstrate transfers and ambulation with up to min assist and RW for support. Pt was educated on precautions, brace application/wearing schedule, and appropriate activity progression. Pt currently with functional limitations due to the deficits listed below (see PT Problem List). Pt will benefit from skilled PT to increase their independence and safety with mobility to allow discharge to the venue listed below.      Follow Up Recommendations No PT follow up;Supervision for mobility/OOB    Equipment Recommendations  None recommended by PT    Recommendations for Other Services       Precautions / Restrictions Precautions Precautions: Fall;Back Precaution Booklet Issued: Yes (comment) Precaution Comments: Reviewed handout verbally and pt was cued for precautions during functional mobility.  Required Braces or Orthoses: Spinal Brace Spinal Brace: Lumbar corset;Applied in sitting position Restrictions Weight Bearing Restrictions: No      Mobility  Bed Mobility Overal bed mobility: Needs Assistance Bed Mobility: Rolling;Sidelying to Sit Rolling: Supervision Sidelying to sit: Supervision       General bed mobility comments: Increased time and effort due to pain and lethargy, but pt able to transition to EOB without assist. HOB slightly elevated (has an adjustable bed at home), and use of rails required.   Transfers Overall transfer level: Needs assistance Equipment used: Rolling walker (2  wheeled) Transfers: Sit to/from Stand Sit to Stand: Min assist         General transfer comment: VC's for hand placement on seated surface for safety. Min assist for power-up to full standing position.   Ambulation/Gait Ambulation/Gait assistance: Min guard Gait Distance (Feet): 100 Feet Assistive device: Rolling walker (2 wheeled) Gait Pattern/deviations: Step-through pattern;Decreased stride length;Trunk flexed Gait velocity: Decreased Gait velocity interpretation: <1.31 ft/sec, indicative of household ambulator General Gait Details: VC's for improved posture and eyes open throughout gait training. Pt very fatigued and wanting to keep eyes closed. Distance limited by RLE pain.   Stairs            Wheelchair Mobility    Modified Rankin (Stroke Patients Only)       Balance Overall balance assessment: Needs assistance Sitting-balance support: Feet supported;No upper extremity supported Sitting balance-Leahy Scale: Fair     Standing balance support: Bilateral upper extremity supported;During functional activity Standing balance-Leahy Scale: Poor Standing balance comment: Reliant on UE support throughout OOB mobility.                              Pertinent Vitals/Pain Pain Assessment: 0-10 Pain Score: 7  Pain Location: R lower abdomen/hip flexor Pain Descriptors / Indicators: Operative site guarding    Home Living Family/patient expects to be discharged to:: Private residence Living Arrangements: Spouse/significant other Available Help at Discharge: Family;Available 24 hours/day Type of Home: House Home Access: Stairs to enter Entrance Stairs-Rails: Right;Left;Can reach both Entrance Stairs-Number of Steps: 4 Home Layout: One level Home Equipment: Walker - 2 wheels;Shower seat (vs BSC)      Prior Function Level of Independence: Independent  Hand Dominance   Dominant Hand: Right    Extremity/Trunk Assessment   Upper  Extremity Assessment Upper Extremity Assessment: Defer to OT evaluation    Lower Extremity Assessment Lower Extremity Assessment: Generalized weakness;RLE deficits/detail RLE Deficits / Details: Pt reporting pain in the R hip (psoas area) and is not putting as much weight through it while OOB    Cervical / Trunk Assessment Cervical / Trunk Assessment: Other exceptions Cervical / Trunk Exceptions: s/p surgery  Communication   Communication: No difficulties  Cognition Arousal/Alertness: Lethargic;Suspect due to medications Behavior During Therapy: Flat affect Overall Cognitive Status: Within Functional Limits for tasks assessed                                        General Comments      Exercises     Assessment/Plan    PT Assessment Patient needs continued PT services  PT Problem List Decreased strength;Decreased activity tolerance;Decreased balance;Decreased mobility;Decreased knowledge of use of DME;Decreased safety awareness;Decreased knowledge of precautions;Pain       PT Treatment Interventions DME instruction;Gait training;Stair training;Functional mobility training;Therapeutic activities;Therapeutic exercise;Neuromuscular re-education;Patient/family education    PT Goals (Current goals can be found in the Care Plan section)  Acute Rehab PT Goals Patient Stated Goal: Improved pain control PT Goal Formulation: With patient/family Time For Goal Achievement: 11/29/19 Potential to Achieve Goals: Good    Frequency Min 5X/week   Barriers to discharge        Co-evaluation               AM-PAC PT "6 Clicks" Mobility  Outcome Measure Help needed turning from your back to your side while in a flat bed without using bedrails?: None Help needed moving from lying on your back to sitting on the side of a flat bed without using bedrails?: None Help needed moving to and from a bed to a chair (including a wheelchair)?: A Little Help needed standing up  from a chair using your arms (e.g., wheelchair or bedside chair)?: A Little Help needed to walk in hospital room?: A Little Help needed climbing 3-5 steps with a railing? : A Little 6 Click Score: 20    End of Session Equipment Utilized During Treatment: Gait belt Activity Tolerance: Patient limited by lethargy Patient left:  (Sitting EOB with husband and OT present) Nurse Communication: Mobility status PT Visit Diagnosis: Unsteadiness on feet (R26.81);Pain Pain - Right/Left: Right Pain - part of body: Hip    Time: 0755-0822 PT Time Calculation (min) (ACUTE ONLY): 27 min   Charges:   PT Evaluation $PT Eval Low Complexity: 1 Low PT Treatments $Gait Training: 8-22 mins        Rolinda Roan, PT, DPT Acute Rehabilitation Services Pager: [phone] Office: [phone]   Thelma Comp 11/22/2019, 9:34 AM

## 2019-11-22 NOTE — Evaluation (Signed)
Occupational Therapy Evaluation Patient Details Name: Robyn Aguilar MRN: [id number] DOB: 15-Jul-1950 Today's Date: 11/22/2019    History of Present Illness Pt is a 69 y/o female who presents s/p XLIF L2-L3 on 11/21/2019. PMH significant for CVA, seizures, migraines, HTN, SM, DDD, CHF, cataracts, R TKR 2014, R rotator cuff repair, prior back surgery 2019, partial L knee replacement 2011.   Clinical Impression   Pt was independent prior to admission. Presents with lethargy, pain, impaired standing balance and inability to access feet for LB ADL. Pt requires min to mod assist for mobility and up to mod assist for ADL. She will likely progress well and return home with her supportive husband. Will follow acutely.    Follow Up Recommendations  No OT follow up    Equipment Recommendations  None recommended by OT    Recommendations for Other Services       Precautions / Restrictions Precautions Precautions: Fall;Back Precaution Booklet Issued: Yes (comment) Precaution Comments: educated pt and husband in back precautions related to bed mobility, ADL and IADL Required Braces or Orthoses: Spinal Brace Spinal Brace: Lumbar corset;Applied in sitting position Restrictions Weight Bearing Restrictions: No      Mobility Bed Mobility Overal bed mobility: Needs Assistance Bed Mobility: Rolling;Sit to Sidelying Rolling: Supervision      Sit to sidelying: Mod assist General bed mobility comments: assisted LEs back into bed  Transfers Overall transfer level: Needs assistance Equipment used: Rolling walker (2 wheeled) Transfers: Sit to/from Stand Sit to Stand: Min guard         General transfer comment: from bed with RW, slow to rise    Balance Overall balance assessment: Needs assistance Sitting-balance support: Feet supported;No upper extremity supported Sitting balance-Leahy Scale: Fair     Standing balance support: Bilateral upper extremity supported;During  functional activity Standing balance-Leahy Scale: Poor Standing balance comment: Reliant on UE support throughout OOB mobility.                            ADL either performed or assessed with clinical judgement   ADL Overall ADL's : Needs assistance/impaired Eating/Feeding: Independent   Grooming: Min guard;Standing;Wash/dry hands Grooming Details (indicate cue type and reason): educated in 2 cup method for oral care and use of wash cloth instead of bending over sink to wash face Upper Body Bathing: Set up;Sitting Upper Body Bathing Details (indicate cue type and reason): recommended long handled bath sponge for back Lower Body Bathing: Moderate assistance;Sit to/from stand Lower Body Bathing Details (indicate cue type and reason): recommended long handled bath sponge and reacher for feet Upper Body Dressing : Set up;Sitting   Lower Body Dressing: Moderate assistance;Sit to/from stand Lower Body Dressing Details (indicate cue type and reason): recommended reacher, sock aid Toilet Transfer: Minimal assistance;Ambulation;RW     Toileting - Clothing Manipulation Details (indicate cue type and reason): instructed to avoid twisting with pericare vs use of tongs     Functional mobility during ADLs: Minimal assistance;Rolling walker General ADL Comments: Pt has reliable assistance for IADL.     Vision Patient Visual Report: No change from baseline       Perception     Praxis      Pertinent Vitals/Pain Pain Assessment: 0-10 Pain Score: 7  Pain Location: R lower abdomen/hip flexor Pain Descriptors / Indicators: Operative site guarding Pain Intervention(s): Premedicated before session;Repositioned     Hand Dominance Right   Extremity/Trunk Assessment Upper Extremity Assessment Upper Extremity Assessment: Overall  WFL for tasks assessed   Lower Extremity Assessment Lower Extremity Assessment: Defer to PT evaluation RLE Deficits / Details: Pt reporting pain in the  R hip (psoas area) and is not putting as much weight through it while OOB   Cervical / Trunk Assessment Cervical / Trunk Assessment: Other exceptions Cervical / Trunk Exceptions: s/p surgery   Communication Communication Communication: No difficulties   Cognition Arousal/Alertness: Lethargic;Suspect due to medications Behavior During Therapy: Flat affect Overall Cognitive Status: Within Functional Limits for tasks assessed                                 General Comments: pt appears to have absorbed education despite eyes closed much of session   General Comments       Exercises     Shoulder Instructions      Home Living Family/patient expects to be discharged to:: Private residence Living Arrangements: Spouse/significant other Available Help at Discharge: Family;Available 24 hours/day Type of Home: House Home Access: Stairs to enter [employer] of Steps: 4 Entrance Stairs-Rails: Right;Left;Can reach both Home Layout: One level     Bathroom Shower/Tub: Occupational psychologist: Handicapped height     Home Equipment: Environmental consultant - 2 wheels;Shower seat          Prior Functioning/Environment Level of Independence: Independent                 OT Problem List: Impaired balance (sitting and/or standing);Decreased knowledge of use of DME or AE;Obesity      OT Treatment/Interventions: Self-care/ADL training;DME and/or AE instruction;Therapeutic activities;Patient/family education;Balance training    OT Goals(Current goals can be found in the care plan section) Acute Rehab OT Goals Patient Stated Goal: Improved pain control OT Goal Formulation: With patient Time For Goal Achievement: 12/06/19 Potential to Achieve Goals: Good ADL Goals Pt Will Perform Grooming: with supervision;standing Pt Will Transfer to Toilet: with supervision;ambulating Pt Will Perform Toileting - Clothing Manipulation and hygiene: with supervision;sit to/from  stand Pt Will Perform Tub/Shower Transfer: Shower transfer;with supervision;shower seat;ambulating;rolling walker Additional ADL Goal #1: Pt will perform bed mobility modified independently in preparation for ADL. Additional ADL Goal #2: Pt will be knowlegeable in use of AE for LB ADL.  OT Frequency: Min 2X/week   Barriers to D/C:            Co-evaluation              AM-PAC OT "6 Clicks" Daily Activity     Outcome Measure Help from another person eating meals?: None Help from another person taking care of personal grooming?: A Little Help from another person toileting, which includes using toliet, bedpan, or urinal?: A Little Help from another person bathing (including washing, rinsing, drying)?: A Lot Help from another person to put on and taking off regular upper body clothing?: None Help from another person to put on and taking off regular lower body clothing?: A Lot 6 Click Score: 18   End of Session Equipment Utilized During Treatment: Back brace;Rolling walker;Gait belt  Activity Tolerance: Patient limited by lethargy Patient left: in bed;with call bell/phone within reach;with family/visitor present  OT Visit Diagnosis: Unsteadiness on feet (R26.81);Other abnormalities of gait and mobility (R26.89);Pain                Time: 2620-3559 OT Time Calculation (min): 14 min Charges:  OT General Charges $OT Visit: 1 Visit OT Evaluation $OT Eval Moderate  Complexity: [place], OTR/L Acute Rehabilitation Services Pager: [phone] Office: [phone]  Malka So 11/22/2019, 9:52 AM

## 2019-11-22 NOTE — Care Management Obs Status (Signed)
[facility] NOTIFICATION   Patient Details  Name: Robyn Aguilar MRN: [id number] Date of Birth: 1950-11-17   Medicare Observation Status Notification Given:  Yes    Benard Halsted, LCSW 11/22/2019, 5:32 PM

## 2019-11-22 NOTE — Progress Notes (Signed)
    Subjective: Procedure(s) (LRB): ANTERIOR LATERAL LUMBAR FUSION (XLIF) with lateral plate Lumbar two through lumbar three (N/A) 1 Day Post-Op  Patient reports pain as 4 on 0-10 scale.  Reports decreased leg pain reports incisional back pain   Positive void Negative bowel movement Positive flatus Negative chest pain or shortness of breath  Objective: Vital signs in last 24 hours: Temp:  [97.8 F (36.6 C)-98.8 F (37.1 C)] 98.8 F (37.1 C) (07/23 0457) Pulse Rate:  [77-97] 97 (07/23 0457) Resp:  [10-20] 20 (07/23 0457) BP: (117-145)/(70-89) 117/70 (07/23 0457) SpO2:  [95 %-100 %] 100 % (07/23 0457) Weight:  [99.3 kg] 99.3 kg (07/22 1117)  Intake/Output from previous day: 07/22 0701 - 07/23 0700 In: 1290 [P.O.:240; I.V.:800; IV Piggyback:250] Out: 550 [Urine:500; Blood:50]  Labs: No results for input(s): WBC, RBC, HCT, PLT in the last 72 hours. No results for input(s): NA, K, CL, CO2, BUN, CREATININE, GLUCOSE, CALCIUM in the last 72 hours. No results for input(s): LABPT, INR in the last 72 hours.  Physical Exam: Neurologically intact ABD soft Intact pulses distally Incision: dressing C/D/I Compartment soft Complains of left groin/hip flexor pain.  Able to elevate the leg off of the bed. Body mass index is 37.59 kg/m.   Assessment/Plan: Patient stable  xrays n/a Continue mobilization with physical therapy Continue care  Advance diet Up with therapy  1.  Overall patient is doing well. 2.  Pain control with Percocet is being tolerated well.  She denies any hives or pruritus. 3.  Plan on mobilization today with physical therapy.  This should begin to improve the hip flexor pain. 4.  Patient's hip flexor pain is to be expected given the transpsoas approach.  I do believe this will resolve over time. 5.  Plan on discharge to home tomorrow provided she is ambulating and cleared from PT/OT standpoint. 6.  She will follow-up with me in 2 weeks for wound  check.  Melina Schools, MD [facility] [phone]

## 2019-11-23 LAB — GLUCOSE, CAPILLARY: Glucose-Capillary: 123 mg/dL — ABNORMAL HIGH (ref 70–99)

## 2019-11-23 MED ORDER — HYDROXYZINE HCL 50 MG/ML IM SOLN
50.0000 mg | Freq: Four times a day (QID) | INTRAMUSCULAR | Status: DC | PRN
  Administered 2019-11-23: 50 mg via INTRAMUSCULAR
  Filled 2019-11-23 (×2): qty 1

## 2019-11-23 MED ORDER — MAGNESIUM CITRATE PO SOLN
1.0000 | Freq: Once | ORAL | Status: AC
  Administered 2019-11-23: 1 via ORAL
  Filled 2019-11-23: qty 296

## 2019-11-23 NOTE — Progress Notes (Signed)
    Subjective: Procedure(s) (LRB): ANTERIOR LATERAL LUMBAR FUSION (XLIF) with lateral plate Lumbar two through lumbar three (N/A) 2 Days Post-Op  Patient reports pain as 4 on 0-10 scale.  Reports increased right leg pain reports incisional back pain   Positive void Negative bowel movement Negative flatus Negative chest pain or shortness of breath  Objective: Vital signs in last 24 hours: Temp:  [98.2 F (36.8 C)-99.3 F (37.4 C)] 99.1 F (37.3 C) (07/24 0735) Pulse Rate:  [72-88] 88 (07/24 0735) Resp:  [16-20] 18 (07/24 0735) BP: (88-119)/(47-76) 88/57 (07/24 0735) SpO2:  [96 %-100 %] 96 % (07/24 0735)  Intake/Output from previous day: No intake/output data recorded.  Labs: No results for input(s): WBC, RBC, HCT, PLT in the last 72 hours. No results for input(s): NA, K, CL, CO2, BUN, CREATININE, GLUCOSE, CALCIUM in the last 72 hours. No results for input(s): LABPT, INR in the last 72 hours.  Physical Exam: Neurologically intact ABD soft Intact pulses distally Incision: dressing C/D/I and no drainage Compartment soft Body mass index is 37.59 kg/m.   Assessment/Plan: Patient stable  xrays n/a Continue mobilization with physical therapy Continue care  1.  Patient having increasing right groin pain when she ambulates.  This is somewhat consistent with her preoperative L2 nerve irritation.  I believe the pain she is having is not only secondary to her chronic L2 nerve compression for which we did the surgery but also from releasing the contralateral annulus on that right side during surgery.  She is neurologically intact and I do believe this pain will improve but it is affecting her current mobility. 2.  Patient has not had a bowel movement since Wednesday and so we will continue mag citrate and move forward with a fleets enema today. 3.  At this point we will hold on discharge for today and I will reevaluate her in the morning.  We will transfer to either 4 N. or 3 W.  depending on bed availability.  Melina Schools, MD [facility] [phone]

## 2019-11-23 NOTE — Progress Notes (Signed)
Physical Therapy Treatment Patient Details Name: Robyn Aguilar MRN: [id number] DOB: [birth year] Today's Date: 11/23/2019    History of Present Illness Pt is a 69 y/o female who presents s/p XLIF L2-L3 on 11/21/2019. PMH significant for CVA, seizures, migraines, HTN, SM, DDD, CHF, cataracts, R TKR 2014, R rotator cuff repair, prior back surgery 2019, partial L knee replacement 2011.    PT Comments    Pt making steady progress with functional mobility and tolerated stair training this session. Increased pain but able to perform. Pt's spouse present throughout. Plan is to d/c home with family support today.    Follow Up Recommendations  No PT follow up;Supervision for mobility/OOB     Equipment Recommendations  None recommended by PT    Recommendations for Other Services       Precautions / Restrictions Precautions Precautions: Fall;Back Precaution Comments: pt able to recall 3/3 back precautions Required Braces or Orthoses: Spinal Brace Spinal Brace: Lumbar corset;Applied in sitting position Restrictions Weight Bearing Restrictions: No    Mobility  Bed Mobility Overal bed mobility: Needs Assistance Bed Mobility: Rolling;Sidelying to Sit;Sit to Sidelying Rolling: Supervision Sidelying to sit: Supervision     Sit to sidelying: Min assist General bed mobility comments: assistance needed to return bilateral LEs onto bed  Transfers Overall transfer level: Needs assistance Equipment used: Rolling walker (2 wheeled) Transfers: Sit to/from Stand Sit to Stand: Min guard         General transfer comment: good technique utilized, min guard for safety  Ambulation/Gait Ambulation/Gait assistance: Min guard Gait Distance (Feet): 100 Feet Assistive device: Rolling walker (2 wheeled) Gait Pattern/deviations: Step-through pattern;Decreased stride length;Trunk flexed Gait velocity: Decreased   General Gait Details: pt with slow, steady and guarded gait with  appropriate use of RW, no LOB or need for physical assistance, min guard for safety   Stairs Stairs: Yes Stairs assistance: Min guard Stair Management: One rail Left;Step to pattern;Forwards Number of Stairs: 1 General stair comments: limited due to pain, cueing to ascend with L LE leading and descend with R LE leading   Wheelchair Mobility    Modified Rankin (Stroke Patients Only)       Balance Overall balance assessment: Needs assistance Sitting-balance support: Feet supported;No upper extremity supported Sitting balance-Leahy Scale: Good     Standing balance support: During functional activity;Bilateral upper extremity supported;Single extremity supported Standing balance-Leahy Scale: Poor                              Cognition Arousal/Alertness: Awake/alert Behavior During Therapy: WFL for tasks assessed/performed Overall Cognitive Status: Within Functional Limits for tasks assessed                                        Exercises      General Comments        Pertinent Vitals/Pain Pain Assessment: 0-10 Pain Score: 3  Pain Location: R groin Pain Descriptors / Indicators: Cramping Pain Intervention(s): Monitored during session;Repositioned    Home Living                      Prior Function            PT Goals (current goals can now be found in the care plan section) Acute Rehab PT Goals PT Goal Formulation: With patient/family Time For Goal Achievement: 11/29/19 Potential  to Achieve Goals: Good Progress towards PT goals: Progressing toward goals    Frequency    Min 5X/week      PT Plan Current plan remains appropriate    Co-evaluation              AM-PAC PT "6 Clicks" Mobility   Outcome Measure  Help needed turning from your back to your side while in a flat bed without using bedrails?: None Help needed moving from lying on your back to sitting on the side of a flat bed without using bedrails?:  None Help needed moving to and from a bed to a chair (including a wheelchair)?: None Help needed standing up from a chair using your arms (e.g., wheelchair or bedside chair)?: None Help needed to walk in hospital room?: A Little Help needed climbing 3-5 steps with a railing? : A Little 6 Click Score: 22    End of Session Equipment Utilized During Treatment: Back brace Activity Tolerance: Patient tolerated treatment well Patient left: in bed;with call bell/phone within reach;with family/visitor present;Other (comment) (MD present) Nurse Communication: Mobility status PT Visit Diagnosis: Unsteadiness on feet (R26.81);Pain Pain - Right/Left: Right Pain - part of body:  (groin)     Time: 1610-9604 PT Time Calculation (min) (ACUTE ONLY): 20 min  Charges:  $Gait Training: 8-22 mins                     Anastasio Champion, DPT  Acute Rehabilitation Services Pager [phone] Office [place] 11/23/2019, 8:44 AM

## 2019-11-23 NOTE — Progress Notes (Signed)
Patient transferred from room 3C05 to unit 4NP10 at this time. Alert and in stable condition. Report given to receiving nurse Elmyra Ricks, RN with all questions answered. Transported via bed with spouse and all belongings at side.

## 2019-11-24 NOTE — Progress Notes (Signed)
Discharged per orders. AVS  And RX's given to patient. To Car with spouse and all belongings. Simmie Davies RN

## 2019-11-24 NOTE — Progress Notes (Signed)
    Subjective: Procedure(s) (LRB): ANTERIOR LATERAL LUMBAR FUSION (XLIF) with lateral plate Lumbar two through lumbar three (N/A) 3 Days Post-Op  Patient reports pain as 3 on 0-10 scale.  Reports decreased leg pain reports incisional back pain   Positive void Positive bowel movement Positive flatus Negative chest pain or shortness of breath  Objective: Vital signs in last 24 hours: Temp:  [98.3 F (36.8 C)-99.4 F (37.4 C)] 98.3 F (36.8 C) (07/25 0348) Pulse Rate:  [81-97] 97 (07/25 0348) Resp:  [16-18] 16 (07/24 2344) BP: (119-137)/(66-80) 129/80 (07/25 0348) SpO2:  [98 %-100 %] 98 % (07/25 0348)  Intake/Output from previous day: 07/24 0701 - 07/25 0700 In: -  Out: 1 [Stool:1]  Labs: No results for input(s): WBC, RBC, HCT, PLT in the last 72 hours. No results for input(s): NA, K, CL, CO2, BUN, CREATININE, GLUCOSE, CALCIUM in the last 72 hours. No results for input(s): LABPT, INR in the last 72 hours.  Physical Exam: Neurologically intact ABD soft Neurovascular intact Intact pulses distally Dorsiflexion/Plantar flexion intact Incision: dressing C/D/I and no drainage Compartment soft Body mass index is 37.59 kg/m.   Assessment/Plan: Patient stable  xrays n/a Continue mobilization with physical therapy Continue care  1. Patient notes right groin is slowly improving.  Ambulating without assistance - she will have pain but duration and intensity is improving. 2. Plan on d/c to home today.  Follow up in 2 weeks. 3. meds provided - patient tolerated oxycodone without adverse drug or allergic reaction.   Melina Schools, MD [facility] [phone]

## 2019-11-24 NOTE — Progress Notes (Signed)
PT Cancellation Note  Patient Details Name: Robyn Aguilar MRN: [id number] DOB: 03-23-1951   Cancelled Treatment:    Reason Eval/Treat Not Completed: Other (comment)   Politely declining PT and walking at this time; Tells me she just had pain meds and Gabapentin, and is having difficulty keeping her eyes open; Requests I return a bit later; Plan to return as time allows today -- noted plan is to dc today;  noted also that she did cover progressive amb and stair negotiation last session.   [name] 11/24/2019, 11:24 AM

## 2019-11-24 NOTE — Progress Notes (Signed)
Occupational Therapy Progress Note   Pt reports improvement in pain.  Pt instructed in use of AE, but as she began practice, decided that her spouse would assist her with all LB ADLs at discharge, and he confirmed this.  Discussed use and acquisition of toileting aid, and discussed bathroom safety.    No follow up OT recommended.     11/24/19 1100  OT Visit Information  Last OT Received On 11/24/19  Assistance Needed +1  History of Present Illness Pt is a 69 y/o female who presents s/p XLIF L2-L3 on 11/21/2019. PMH significant for CVA, seizures, migraines, HTN, SM, DDD, CHF, cataracts, R TKR 2014, R rotator cuff repair, prior back surgery 2019, partial L knee replacement 2011.  Precautions  Precautions Fall;Back  Precaution Comments pt able to recall 3/3 back precautions  Required Braces or Orthoses Spinal Brace  Spinal Brace Lumbar corset;Applied in sitting position  Pain Assessment  Pain Assessment Faces  Faces Pain Scale 4  Pain Location R groin  Pain Descriptors / Indicators Cramping;Burning;Shooting  Pain Intervention(s) Premedicated before session;Heat applied;Ice applied  Cognition  Arousal/Alertness Awake/alert  Behavior During Therapy WFL for tasks assessed/performed  Overall Cognitive Status Within Functional Limits for tasks assessed  Upper Extremity Assessment  Upper Extremity Assessment Overall WFL for tasks assessed  Lower Extremity Assessment  Lower Extremity Assessment Defer to PT evaluation  ADL  Overall ADL's  Needs assistance/impaired  Functional mobility during ADLs Min guard;Rolling walker  General ADL Comments Pt instructed in use and acquisition of AE.   She reports and spouse confirms, he will assist her with all LB ADLs.  Did discuss benefit of having a reacher, and she reports she has one available.   Reviewed use of toileting aid.   Discussed BR safety and recommend pt sit to shower and have spouse supervise transfer for safety   Bed Mobility  Overal bed  mobility Needs Assistance  Bed Mobility Sit to Sidelying;Sidelying to Sit  Sidelying to sit Supervision  Sit to sidelying Mod assist;HOB elevated  General bed mobility comments increased time, and assist to lift LE onto bed   Balance  Overall balance assessment Needs assistance  Sitting-balance support Feet supported;No upper extremity supported  Sitting balance-Leahy Scale Good  Standing balance support During functional activity;Bilateral upper extremity supported;Single extremity supported  Standing balance-Leahy Scale Poor  Standing balance comment reliant on UE support   Transfers  Overall transfer level Needs assistance  Equipment used Rolling walker (2 wheeled)  Transfers Sit to/from [employer] Transfers  Sit to Stand Min guard  Stand pivot transfers Min guard  OT - End of Session  Equipment Utilized During Treatment Back brace;Rolling walker  Activity Tolerance Patient limited by pain  Patient left in bed;with call bell/phone within reach;with family/visitor present  OT Assessment/Plan  OT Plan Discharge plan remains appropriate  OT Visit Diagnosis Unsteadiness on feet (R26.81);Other abnormalities of gait and mobility (R26.89);Pain  Pain - part of body  (back )  OT Frequency (ACUTE ONLY) Min 2X/week  Follow Up Recommendations No OT follow up  OT Equipment None recommended by OT  AM-PAC OT "6 Clicks" Daily Activity Outcome Measure (Version 2)  Help from another person eating meals? 4  Help from another person taking care of personal grooming? 3  Help from another person toileting, which includes using toliet, bedpan, or urinal? 2  Help from another person bathing (including washing, rinsing, drying)? 2  Help from another person to put on and taking off regular upper body  clothing? 3  Help from another person to put on and taking off regular lower body clothing? 2  6 Click Score 16  OT Goal Progression  Progress towards OT goals Progressing toward goals  OT Time  Calculation  OT Start Time (ACUTE ONLY) 1007  OT Stop Time (ACUTE ONLY) 1027  OT Time Calculation (min) 20 min  OT General Charges  $OT Visit 1 Visit  OT Treatments  $Self Care/Home Management  8-22 mins  Nilsa Nutting., OTR/L Acute Rehabilitation Services Pager [phone] Office [phone]

## 2019-11-24 NOTE — Progress Notes (Signed)
Occupational Therapy Treatment Patient Details Name: Robyn Aguilar MRN: [id number] DOB: 1950/11/30 Today's Date: 11/24/2019    History of present illness Pt is a 69 y/o female who presents s/p XLIF L2-L3 on 11/21/2019. PMH significant for CVA, seizures, migraines, HTN, SM, DDD, CHF, cataracts, R TKR 2014, R rotator cuff repair, prior back surgery 2019, partial L knee replacement 2011.   OT comments  Pt demonstrates good understanding of back precautions, but activity limited by pain.   She requires min - max A for ADLs, and will benefit from use of AE to allow her to perform more independently.   Follow Up Recommendations  No OT follow up    Equipment Recommendations  None recommended by OT    Recommendations for Other Services      Precautions / Restrictions Precautions Precautions: Fall;Back Precaution Comments: pt able to recall 3/3 back precautions Required Braces or Orthoses: Spinal Brace Spinal Brace: Lumbar corset;Applied in sitting position       Mobility Bed Mobility Overal bed mobility: Needs Assistance Bed Mobility: Sit to Sidelying         Sit to sidelying: Mod assist;HOB elevated General bed mobility comments: assist for bil. LEs   Transfers Overall transfer level: Needs assistance Equipment used: Rolling walker (2 wheeled) Transfers: Sit to/from [employer] Sit to Stand: Min guard Stand pivot transfers: Min guard            Balance Overall balance assessment: Needs assistance Sitting-balance support: Feet supported;No upper extremity supported Sitting balance-Leahy Scale: Good     Standing balance support: During functional activity;Bilateral upper extremity supported;Single extremity supported Standing balance-Leahy Scale: Poor Standing balance comment: reliant on UE support                            ADL either performed or assessed with clinical judgement   ADL Overall ADL's : Needs  assistance/impaired                         Toilet Transfer: Min guard;Ambulation;RW;Comfort height toilet;Grab bars Toilet Transfer Details (indicate cue type and reason): pt demonstrates good technique and good awareness of back precautions  Toileting- Clothing Manipulation and Hygiene: Total assistance;Sit to/from stand Toileting - Clothing Manipulation Details (indicate cue type and reason): Pt unable to access posterior peri area, therefore assisted with peri care.  Discussed use of tongs with pt.       Functional mobility during ADLs: Passenger transport manager     Praxis      Cognition Arousal/Alertness: Awake/alert Behavior During Therapy: WFL for tasks assessed/performed Overall Cognitive Status: Within Functional Limits for tasks assessed                                          Exercises     Shoulder Instructions       General Comments      Pertinent Vitals/ Pain       Pain Assessment: Faces Faces Pain Scale: Hurts even more Pain Location: R groin Pain Descriptors / Indicators: Cramping;Burning;Shooting Pain Intervention(s): Limited activity within patient's tolerance;Monitored during session;Repositioned;Premedicated before session;Heat applied  Home Living   Living Arrangements: Spouse/significant other  Prior Functioning/Environment              Frequency  Min 2X/week        Progress Toward Goals  OT Goals(current goals can now be found in the care plan section)  Progress towards OT goals: Progressing toward goals     Plan Discharge plan remains appropriate    Co-evaluation                 AM-PAC OT "6 Clicks" Daily Activity     Outcome Measure   Help from another person eating meals?: None Help from another person taking care of personal grooming?: A Little Help from another person toileting, which includes using  toliet, bedpan, or urinal?: A Lot Help from another person bathing (including washing, rinsing, drying)?: A Lot Help from another person to put on and taking off regular upper body clothing?: A Little Help from another person to put on and taking off regular lower body clothing?: A Lot 6 Click Score: 16    End of Session Equipment Utilized During Treatment: Back brace;Rolling walker  OT Visit Diagnosis: Unsteadiness on feet (R26.81);Other abnormalities of gait and mobility (R26.89);Pain Pain - part of body:  (back )   Activity Tolerance Patient limited by pain   Patient Left in bed;with call bell/phone within reach;with family/visitor present   Nurse Communication          Time: 4665-9935 OT Time Calculation (min): 18 min  Charges: OT General Charges $OT Visit: 1 Visit OT Treatments $Self Care/Home Management : 8-22 mins  Nilsa Nutting OTR/L Acute Rehabilitation Services Pager [phone] Office [phone]    Lucille Passy M 11/24/2019, 9:22 AM

## 2019-11-24 NOTE — Discharge Instructions (Signed)
  Spinal Fusion, Adult, Care After This sheet gives you information about how to care for yourself after your procedure. Your doctor may also give you more specific instructions. If you have problems or questions, contact your doctor. Follow these instructions at home: Medicines Take over-the-counter and prescription medicines only as told by your doctor. These include any medicines for pain or blood-thinning medicines (anticoagulants). If you were prescribed an antibiotic medicine, take it as told by your doctor. Do not stop taking the antibiotic even if you start to feel better. Do not drive for 24 hours if you were given a medicine to help you relax (sedative) during your procedure. Do not drive or use heavy machinery while taking prescription pain medicine. If you have a brace: Wear the brace as told by your doctor. Take it off only as told by your doctor. Keep the brace clean. Managing pain, stiffness, and swelling If directed, put ice on the surgery area: If you have a removable brace, take it off as told by your doctor. Put ice in a plastic bag. Place a towel between your skin and the bag. Leave the ice on for 20 minutes, 2-3 times a day. Surgery cut care    Follow instructions from your doctor about how to take care of your cut from surgery (incision). Make sure you: Wash your hands with soap and water before you change your bandage (dressing). If you cannot use soap and water, use hand sanitizer. Change your bandage as told by your doctor. Leave stitches (sutures), skin glue, or skin tape (adhesive) strips in place. They may need to stay in place for 2 weeks or longer. If tape strips get loose and curl up, you may trim the loose edges. Do not remove tape strips completely unless your doctor says it is okay. Keep your cut from surgery clean and dry. Do not take baths, swim, or use a hot tub until your doctor says it is okay. Ask your doctor if you can take showers. You may only  be allowed to take sponge baths. Every day, check your cut from surgery and the area around it for: More redness, swelling, or pain. Fluid or blood. Warmth. Pus or a bad smell. If you have a drain tube, follow instructions from your doctor about caring for it. Do not take out the drain tube or any bandages unless your doctor says it is okay. Physical activity Rest and protect your back as much as possible. Follow instructions from your doctor about how to move. Use good posture to help your spine heal. Do not lift anything that is heavier than 8 lb (3.6 kg), or the limit that you are told, until your doctor says that it is safe. Do not twist or bend at the waist until your doctor says it is okay. It is best if you: Do not make pushing and pulling motions. Do not sit or lie down in the same position for a long time. Do not raise your hands or arms above your head. Return to your normal activities as told by your doctor. Ask your doctor what activities are safe for you. Rest and protect your back as much as you can. Do not start to exercise until your doctor says it is okay. Ask your doctor what kinds of exercise you can do to make your back stronger. Ok to shower in 5 days.  Do not take a bath or submerge the wound General instructions To prevent blood clots and lessen swelling   in your legs: Wear compression stockings as told. Walk one or more times every few hours as told by your doctor. Do not use any products that contain nicotine or tobacco, such as cigarettes and e-cigarettes. These can delay bone healing. If you need help quitting, ask your doctor. To prevent or treat constipation while you are taking prescription pain medicine, your doctor may suggest that you: Drink enough fluid to keep your pee (urine) pale yellow. Take over-the-counter or prescription medicines. Eat foods that are high in fiber. These include fresh fruits and vegetables, whole grains, and beans. Limit foods that  are high in fat and processed sugars, such as fried and sweet foods. Keep all follow-up visits as told by your doctor. This is important. Contact a doctor if: Your pain gets worse. Your medicine does not help your pain. Your legs or feet get painful or swollen. Your cut from surgery is more red, swollen, or painful. Your cut from surgery feels warm to the touch. You have: Fluid or blood coming from your cut from surgery. Pus or a bad smell coming from your cut from surgery. A fever. Weakness or loss of feeling (numbness) in your legs that is new or getting worse. Trouble controlling when you pee (urinate) or poop (have a bowel movement). You feel sick to your stomach (nauseous). You throw up (vomit). Get help right away if: Your pain is very bad. You have chest pain. You have trouble breathing. You start to have a cough. These symptoms may be an emergency. Do not wait to see if the symptoms will go away. Get medical help right away. Call your local emergency services (911 in the [place] Do not drive yourself to the hospital. Summary After the procedure, it is common to have pain in your back and pain by your surgery cut(s). Icing and pain medicines may help to control the pain. Follow directions from your doctor. Rest and protect your back as much as possible. Do not twist or bend at the waist. Get up and walk one or more times every few hours as told by your doctor. This information is not intended to replace advice given to you by your health care provider. Make sure you discuss any questions you have with your health care provider.  -signs and symptoms of a blood clot such as chest pain; shortness of breath; pain, swelling, or warmth in the leg -signs and symptoms of a stroke such as changes in vision; confusion; trouble speaking or understanding; severe headaches; sudden numbness or weakness of the face, arm or leg; trouble walking; dizziness; loss of coordination Side effects that  usually do not require medical attention (report to your doctor or health care professional if they continue or are bothersome): -hair loss -pain, redness, or irritation at site where injected This list may not describe all possible side effects. Call your doctor for medical advice about side effects. You may report side effects to FDA at 1-800-FDA-1088. Where should I keep my medicine? Keep out of the reach of children. Store at room temperature between 15 and 30 degrees C (59 and 86 degrees F). Do not freeze. If your injections have been specially prepared, you may need to store them in the refrigerator. Ask your pharmacist. Throw away any unused medicine after the expiration date. NOTE: This sheet is a summary. It may not cover all possible information. If you have questions about this medicine, talk to your doctor, pharmacist, or health care provider.       F). Do not freeze. If your injections have been specially prepared, you may need to store them in the refrigerator. Ask your pharmacist. Throw away any unused medicine after the expiration date. NOTE: This sheet is a summary. It may not cover all possible information. If you have questions about this medicine, talk to your doctor, pharmacist, or health care provider.

## 2019-11-26 ENCOUNTER — Encounter (HOSPITAL_COMMUNITY): Payer: Self-pay | Admitting: Orthopedic Surgery

## 2019-11-26 NOTE — Discharge Summary (Signed)
Patient ID: Robyn Aguilar MRN: [id number] DOB/AGE: 1950-06-26 69 y.o.  Admit date: 11/21/2019 Discharge date: 11/26/2019  Admission Diagnoses:  Active Problems:   Lumbar disc herniation   Discharge Diagnoses:  Active Problems:   Lumbar disc herniation  status post Procedure(s): ANTERIOR LATERAL LUMBAR FUSION (XLIF) with lateral plate Lumbar two through lumbar three  Past Medical History:  Diagnosis Date  . Allergic rhinitis   . Anxiety   . Arthritis    knees and neck  . Cataracts, bilateral    immature   . CHF (congestive heart failure) ([place]) 2014   after surgery  . Complication of anesthesia    had heart failure after knee surgery 02/04/13  . DDD (degenerative disc disease), lumbar   . Diabetes mellitus without complication ([place])   . Dizziness    takes Antivert prn  . GERD (gastroesophageal reflux disease)    takes Aciphex prn  . High cholesterol    but doesn't take any meds  . History of colon polyps   . History of migraine    last one 6-7months ago;takes Topamax nightly  . Hypertension    takes Metoprolol and maxzide daily  . Joint pain   . Joint swelling   . Migraine   . Migraines   . Pneumonia    at age 35  . Seizures ([place])    hx of;last one 89yrs ago;no meds required;states it was brought on by med;only one ever had  . Shortness of breath 02/06/2013  . Sleep apnea    sleep study in epic from 2014-uses CPAP  . Stroke [facility])    2 TIA  . TIA (transient ischemic attack)     Surgeries: Procedure(s): ANTERIOR LATERAL LUMBAR FUSION (XLIF) with lateral plate Lumbar two through lumbar three on 11/21/2019   Consultants:   Discharged Condition: Improved  Hospital Course: Robyn Aguilar is an 69 y.o. female who was admitted 11/21/2019 for operative treatment of Post laminectomy syndrome with recurrent stenosis. Patient failed conservative treatments (please see the history and physical for the specifics) and had severe unremitting pain that  affects sleep, daily activities and work/hobbies. After pre-op clearance, the patient was taken to the operating room on 11/21/2019 and underwent  Procedure(s): ANTERIOR LATERAL LUMBAR FUSION (XLIF) with lateral plate Lumbar two through lumbar three.    Patient was given perioperative antibiotics:  Anti-infectives (From admission, onward)   Start     Dose/Rate Route Frequency Ordered Stop   11/21/19 2300  ceFAZolin (ANCEF) IVPB 1 g/50 mL premix        1 g 100 mL/hr over 30 Minutes Intravenous Every 8 hours 11/21/19 1948 11/22/19 0847   11/21/19 1136  ceFAZolin (ANCEF) 2-4 GM/100ML-% IVPB       Note to Pharmacy: Gleason, Ginger   : cabinet override      11/21/19 1136 11/21/19 1502   11/21/19 1134  ceFAZolin (ANCEF) IVPB 2g/100 mL premix        2 g 200 mL/hr over 30 Minutes Intravenous 30 min pre-op 11/21/19 1134 11/21/19 1523       Patient was given sequential compression devices and early ambulation to prevent DVT.   Patient benefited maximally from hospital stay and there were no complications. At the time of discharge, the patient was urinating/moving their bowels without difficulty, tolerating a regular diet, pain is controlled with oral pain medications and they have been cleared by PT/OT.   Recent vital signs: No data found.   Recent laboratory studies: No results for input(s):  WBC, HGB, HCT, PLT, NA, K, CL, CO2, BUN, CREATININE, GLUCOSE, INR, CALCIUM in the last 72 hours.  Invalid input(s): PT, 2   Discharge Medications:   Allergies as of 11/24/2019      Reactions   Codeine Hives   Statins Other (See Comments)   Muscle pain   Adhesive [tape] Rash   Latex Rash      Medication List    STOP taking these medications   meperidine 50 MG tablet Commonly known as: DEMEROL     TAKE these medications   aspirin EC 81 MG tablet Take 81 mg by mouth daily.   cetirizine 10 MG tablet Commonly known as: ZYRTEC Take 10 mg by mouth daily.   fluticasone 50 MCG/ACT nasal spray  Commonly known as: FLONASE Place 1-2 sprays into both nostrils at bedtime.   gabapentin 300 MG capsule Commonly known as: NEURONTIN Take 300 mg by mouth daily as needed for pain.   LORazepam 0.5 MG tablet Commonly known as: ATIVAN Take 0.25-0.5 mg by mouth daily as needed for anxiety.   methocarbamol 500 MG tablet Commonly known as: Robaxin Take 1 tablet (500 mg total) by mouth every 8 (eight) hours as needed for up to 5 days for muscle spasms.   metoprolol succinate 50 MG 24 hr tablet Commonly known as: TOPROL-XL Take 50 mg by mouth daily. Take with or immediately following a meal.   Nexlizet 180-10 MG Tabs Generic drug: Bempedoic Acid-Ezetimibe Take 1 tablet by mouth daily.   ondansetron 4 MG tablet Commonly known as: Zofran Take 1 tablet (4 mg total) by mouth every 8 (eight) hours as needed for nausea or vomiting.   oxyCODONE-acetaminophen 10-325 MG tablet Commonly known as: Percocet Take 1 tablet by mouth every 6 (six) hours as needed for up to 5 days for pain.   RABEprazole 20 MG tablet Commonly known as: ACIPHEX Take 20 mg by mouth daily.   triamterene-hydrochlorothiazide 75-50 MG tablet Commonly known as: MAXZIDE Take 1 tablet by mouth daily.       Diagnostic Studies: DG Chest 2 View  Result Date: 11/14/2019 CLINICAL DATA:  CHF. EXAM: CHEST - 2 VIEW COMPARISON:  02/08/2013 FINDINGS: Lungs are clear. No pneumothorax or pleural effusion. Cardiomediastinal silhouette unremarkable. Cervical fusion hardware is partially visualized. No acute bone abnormality. IMPRESSION: No active cardiopulmonary disease. Electronically Signed   By: Fidela Salisbury MD   On: 11/14/2019 23:16   DG Lumbar Spine 2-3 Views  Result Date: 11/21/2019 CLINICAL DATA:  L2-L3 fusion. EXAM: DG C-ARM 1-60 MIN; LUMBAR SPINE - 2-3 VIEW FLUOROSCOPY TIME:  118 seconds C-arm fluoroscopic images were obtained intraoperatively and submitted for post operative interpretation. COMPARISON:  Lumbar spine  x-rays dated July 13, 2017. FINDINGS: Multiple intraoperative fluoroscopic images demonstrate interval L2-L3 XLIF. No evidence of hardware failure or loosening. No acute osseous abnormality. IMPRESSION: Intraoperative fluoroscopic guidance for L2-L3 XLIF. Electronically Signed   By: Titus Dubin M.D.   On: 11/21/2019 19:19   DG C-Arm 1-60 Min  Result Date: 11/21/2019 CLINICAL DATA:  L2-L3 fusion. EXAM: DG C-ARM 1-60 MIN; LUMBAR SPINE - 2-3 VIEW FLUOROSCOPY TIME:  118 seconds C-arm fluoroscopic images were obtained intraoperatively and submitted for post operative interpretation. COMPARISON:  Lumbar spine x-rays dated July 13, 2017. FINDINGS: Multiple intraoperative fluoroscopic images demonstrate interval L2-L3 XLIF. No evidence of hardware failure or loosening. No acute osseous abnormality. IMPRESSION: Intraoperative fluoroscopic guidance for L2-L3 XLIF. Electronically Signed   By: Titus Dubin M.D.   On: 11/21/2019 19:19  DG OR LOCAL ABDOMEN  Result Date: 11/21/2019 CLINICAL DATA:  Intraoperative examination, incorrect sponge count EXAM: OR LOCAL ABDOMEN COMPARISON:  None. FINDINGS: Surgical changes of L2-3 extreme lateral fusion with instrumentation are identified. Surgical skin staple overlies the left flank. Several tiny metallic densities are seen overlying the right sacral ala which may reflect surgical clips related to prior surgery, but are not well characterized on this examination. No other unexpected radiopaque densities identified. IMPRESSION: No definite unexpected radiopaque density. Surgical skin staple overlies the left flank. Probable surgical clips overlie the right sacral ala. Electronically Signed   By: Fidela Salisbury MD   On: 11/21/2019 17:16    Discharge Instructions    Incentive spirometry RT   Complete by: As directed        Follow-up Information    Melina Schools, MD. Schedule an appointment as soon as possible for a visit in 2 weeks.   Specialty: Orthopedic  Surgery Why: If symptoms worsen, For suture removal, For wound re-check Contact information: [address] [place] [address] [phone]               Discharge Plan:  discharge to home  Disposition: stable    Signed: Yvonne Kendall Ward for [facility] PA-C [facility] [phone] 11/26/2019, 11:31 AM

## 2020-01-07 DIAGNOSIS — Z4889 Encounter for other specified surgical aftercare: Secondary | ICD-10-CM | POA: Diagnosis not present

## 2020-01-08 ENCOUNTER — Telehealth: Payer: Self-pay | Admitting: Interventional Cardiology

## 2020-01-08 DIAGNOSIS — E782 Mixed hyperlipidemia: Secondary | ICD-10-CM

## 2020-01-08 MED ORDER — EZETIMIBE 10 MG PO TABS
10.0000 mg | ORAL_TABLET | Freq: Every day | ORAL | 3 refills | Status: DC
Start: 2020-01-08 — End: 2020-01-08

## 2020-01-08 MED ORDER — REPATHA SURECLICK 140 MG/ML ~~LOC~~ SOAJ: 1.0000 "pen " | SUBCUTANEOUS | 11 refills | Status: DC

## 2020-01-08 NOTE — Telephone Encounter (Addendum)
Uric acid level was checked at [facility] in May and was elevated at 9.1. Pt states she stopped taking Nexlizet last week. She did not experience any gout flares but remembered hearing that Nexlizet could increase uric acid levels.  Pt is intolerant to rosuvastatin 5mg  every other day, atorvastatin, pravastatin, and Welchol. Baseline LDL is 236 and had improved drastically to 85 on Nexlizet. She is tolerating medication well but agree with stopping bempedoic acid component since it can increase uric acid levels by about ~15%. She previously did not want to take PCSK9i therapy due to needle aversion.  Discussed taking ezetimibe or PCSK9i therapy, pt does not want to take multiple medications for her cholesterol but after lengthy discussion would prefer to take PCSK9i therapy over ezetimibe since it's more effective at lowering LDL. Will submit prior authorization for Repatha.

## 2020-01-08 NOTE — Telephone Encounter (Signed)
Repatha prior auth approved through 07/06/20. Rx sent to pharmacy, [place] applied to rx for $0 copay. Pt is aware.  Pt prefers to come to clinic for her first injection and plans to have her husband administer injections for her. Scheduled appt to review injection technique for 9/10. Also scheduled follow up labs in 2 months to assess efficacy.

## 2020-01-08 NOTE — Telephone Encounter (Signed)
Pt c/o medication issue:  1. Name of Medication: Bempedoic Acid-Ezetimibe (NEXLIZET) 180-10 MG TABS  2. How are you currently taking this medication (dosage and times per day)? Stopped taking  3. Are you having a reaction (difficulty breathing--STAT)? no  4. What is your medication issue? Patient states she was told the medication could make uric acid high. She states she has had lab work done and it has been very high. She states she stopped taking it last Thursday and would like to know if she needs to stay off it. She states she is not having any other side effects. She also states to call after 11:30am, because she has an appointment.

## 2020-01-08 NOTE — Telephone Encounter (Signed)
Will route to PharmD team to contact pt to address cholesterol as she is followed in [facility].

## 2020-01-08 NOTE — Telephone Encounter (Signed)
Thanks. This will be to her advantage.

## 2020-01-10 ENCOUNTER — Ambulatory Visit: Payer: Medicare PPO

## 2020-01-10 ENCOUNTER — Telehealth: Payer: Self-pay

## 2020-01-10 NOTE — Telephone Encounter (Signed)
Pt successfully injected repatha in right stomach region 2 inches from belly button. Pt informed of side effects and voiced understanding

## 2020-01-13 DIAGNOSIS — Z4789 Encounter for other orthopedic aftercare: Secondary | ICD-10-CM | POA: Diagnosis not present

## 2020-01-13 DIAGNOSIS — M545 Low back pain: Secondary | ICD-10-CM | POA: Diagnosis not present

## 2020-01-13 DIAGNOSIS — M5417 Radiculopathy, lumbosacral region: Secondary | ICD-10-CM | POA: Diagnosis not present

## 2020-01-13 DIAGNOSIS — R262 Difficulty in walking, not elsewhere classified: Secondary | ICD-10-CM | POA: Diagnosis not present

## 2020-01-17 DIAGNOSIS — R262 Difficulty in walking, not elsewhere classified: Secondary | ICD-10-CM | POA: Diagnosis not present

## 2020-01-17 DIAGNOSIS — Z4789 Encounter for other orthopedic aftercare: Secondary | ICD-10-CM | POA: Diagnosis not present

## 2020-01-17 DIAGNOSIS — M5417 Radiculopathy, lumbosacral region: Secondary | ICD-10-CM | POA: Diagnosis not present

## 2020-01-17 DIAGNOSIS — M545 Low back pain: Secondary | ICD-10-CM | POA: Diagnosis not present

## 2020-01-22 DIAGNOSIS — M545 Low back pain: Secondary | ICD-10-CM | POA: Diagnosis not present

## 2020-01-22 DIAGNOSIS — M5417 Radiculopathy, lumbosacral region: Secondary | ICD-10-CM | POA: Diagnosis not present

## 2020-01-22 DIAGNOSIS — Z4789 Encounter for other orthopedic aftercare: Secondary | ICD-10-CM | POA: Diagnosis not present

## 2020-01-22 DIAGNOSIS — R262 Difficulty in walking, not elsewhere classified: Secondary | ICD-10-CM | POA: Diagnosis not present

## 2020-01-29 DIAGNOSIS — M545 Low back pain: Secondary | ICD-10-CM | POA: Diagnosis not present

## 2020-01-29 DIAGNOSIS — Z4789 Encounter for other orthopedic aftercare: Secondary | ICD-10-CM | POA: Diagnosis not present

## 2020-01-29 DIAGNOSIS — M5417 Radiculopathy, lumbosacral region: Secondary | ICD-10-CM | POA: Diagnosis not present

## 2020-01-29 DIAGNOSIS — R262 Difficulty in walking, not elsewhere classified: Secondary | ICD-10-CM | POA: Diagnosis not present

## 2020-01-31 DIAGNOSIS — M5417 Radiculopathy, lumbosacral region: Secondary | ICD-10-CM | POA: Diagnosis not present

## 2020-01-31 DIAGNOSIS — R262 Difficulty in walking, not elsewhere classified: Secondary | ICD-10-CM | POA: Diagnosis not present

## 2020-01-31 DIAGNOSIS — Z4889 Encounter for other specified surgical aftercare: Secondary | ICD-10-CM | POA: Diagnosis not present

## 2020-02-04 DIAGNOSIS — R262 Difficulty in walking, not elsewhere classified: Secondary | ICD-10-CM | POA: Diagnosis not present

## 2020-02-04 DIAGNOSIS — M5417 Radiculopathy, lumbosacral region: Secondary | ICD-10-CM | POA: Diagnosis not present

## 2020-02-04 DIAGNOSIS — Z4889 Encounter for other specified surgical aftercare: Secondary | ICD-10-CM | POA: Diagnosis not present

## 2020-02-07 DIAGNOSIS — M5417 Radiculopathy, lumbosacral region: Secondary | ICD-10-CM | POA: Diagnosis not present

## 2020-02-07 DIAGNOSIS — R262 Difficulty in walking, not elsewhere classified: Secondary | ICD-10-CM | POA: Diagnosis not present

## 2020-02-07 DIAGNOSIS — Z4889 Encounter for other specified surgical aftercare: Secondary | ICD-10-CM | POA: Diagnosis not present

## 2020-02-10 ENCOUNTER — Other Ambulatory Visit: Payer: Medicare PPO

## 2020-02-11 DIAGNOSIS — R262 Difficulty in walking, not elsewhere classified: Secondary | ICD-10-CM | POA: Diagnosis not present

## 2020-02-11 DIAGNOSIS — Z4889 Encounter for other specified surgical aftercare: Secondary | ICD-10-CM | POA: Diagnosis not present

## 2020-02-11 DIAGNOSIS — M5417 Radiculopathy, lumbosacral region: Secondary | ICD-10-CM | POA: Diagnosis not present

## 2020-02-12 DIAGNOSIS — R262 Difficulty in walking, not elsewhere classified: Secondary | ICD-10-CM | POA: Diagnosis not present

## 2020-02-12 DIAGNOSIS — Z4889 Encounter for other specified surgical aftercare: Secondary | ICD-10-CM | POA: Diagnosis not present

## 2020-02-12 DIAGNOSIS — M5417 Radiculopathy, lumbosacral region: Secondary | ICD-10-CM | POA: Diagnosis not present

## 2020-02-18 DIAGNOSIS — Z981 Arthrodesis status: Secondary | ICD-10-CM | POA: Diagnosis not present

## 2020-02-19 DIAGNOSIS — Z1231 Encounter for screening mammogram for malignant neoplasm of breast: Secondary | ICD-10-CM | POA: Diagnosis not present

## 2020-02-19 DIAGNOSIS — Z01419 Encounter for gynecological examination (general) (routine) without abnormal findings: Secondary | ICD-10-CM | POA: Diagnosis not present

## 2020-02-20 DIAGNOSIS — R262 Difficulty in walking, not elsewhere classified: Secondary | ICD-10-CM | POA: Diagnosis not present

## 2020-02-20 DIAGNOSIS — M5417 Radiculopathy, lumbosacral region: Secondary | ICD-10-CM | POA: Diagnosis not present

## 2020-02-20 DIAGNOSIS — Z4889 Encounter for other specified surgical aftercare: Secondary | ICD-10-CM | POA: Diagnosis not present

## 2020-02-24 ENCOUNTER — Other Ambulatory Visit: Payer: Self-pay

## 2020-02-24 ENCOUNTER — Other Ambulatory Visit: Payer: Medicare PPO

## 2020-02-24 DIAGNOSIS — E782 Mixed hyperlipidemia: Secondary | ICD-10-CM | POA: Diagnosis not present

## 2020-02-24 LAB — LIPID PANEL
Chol/HDL Ratio: 3.9 ratio (ref 0.0–4.4)
Cholesterol, Total: 190 mg/dL (ref 100–199)
HDL: 49 mg/dL (ref >39)
LDL Chol Calc (NIH): 115 mg/dL — ABNORMAL HIGH (ref 0–99)
Triglycerides: 144 mg/dL (ref 0–149)
VLDL Cholesterol Cal: 26 mg/dL (ref 5–40)

## 2020-02-24 LAB — HEPATIC FUNCTION PANEL
ALT: 28 IU/L (ref 0–32)
AST: 31 IU/L (ref 0–40)
Albumin: 4.6 g/dL (ref 3.8–4.8)
Alkaline Phosphatase: 108 IU/L (ref 44–121)
Bilirubin Total: 0.5 mg/dL (ref 0.0–1.2)
Bilirubin, Direct: 0.22 mg/dL (ref 0.00–0.40)
Total Protein: 7.1 g/dL (ref 6.0–8.5)

## 2020-02-25 ENCOUNTER — Telehealth: Payer: Self-pay | Admitting: Pharmacist

## 2020-02-25 DIAGNOSIS — E782 Mixed hyperlipidemia: Secondary | ICD-10-CM

## 2020-02-25 NOTE — Telephone Encounter (Signed)
Pt returned call to clinic. She wishes to continue on just Repatha for another 3 months and then recheck labs. She does not want to start ezetimibe now due to increased pill burden. Plans to increase her walking as the weather cools down. If her LDL remains above goal in 3 months, she states she will be willing to add ezetimibe at that time.

## 2020-02-25 NOTE — Telephone Encounter (Signed)
Left message for pt to discuss most recent lipid panel results. Her baseline LDL has been as high as 245 in the past, so she has had a ~53% LDL reduction since starting Repatha. Her LDL actually improved more on Nexlizet however she stopped therapy due to increase in uric acid from the bempedoic acid component of the medication. Also previously intolerant to 3 statins and Welchol. When pt returns call, will see if she is willing to resume ezetimibe 10mg  daily in addition to continuing on Repatha. She previously did not wish to take multiple medications for her cholesterol; will emphasize goal < 70 given her history of TIAs.

## 2020-03-03 DIAGNOSIS — M5417 Radiculopathy, lumbosacral region: Secondary | ICD-10-CM | POA: Diagnosis not present

## 2020-03-03 DIAGNOSIS — M79605 Pain in left leg: Secondary | ICD-10-CM | POA: Diagnosis not present

## 2020-03-03 DIAGNOSIS — R262 Difficulty in walking, not elsewhere classified: Secondary | ICD-10-CM | POA: Diagnosis not present

## 2020-03-03 DIAGNOSIS — Z4889 Encounter for other specified surgical aftercare: Secondary | ICD-10-CM | POA: Diagnosis not present

## 2020-03-06 DIAGNOSIS — R262 Difficulty in walking, not elsewhere classified: Secondary | ICD-10-CM | POA: Diagnosis not present

## 2020-03-06 DIAGNOSIS — Z4889 Encounter for other specified surgical aftercare: Secondary | ICD-10-CM | POA: Diagnosis not present

## 2020-03-06 DIAGNOSIS — M5417 Radiculopathy, lumbosacral region: Secondary | ICD-10-CM | POA: Diagnosis not present

## 2020-03-11 DIAGNOSIS — R262 Difficulty in walking, not elsewhere classified: Secondary | ICD-10-CM | POA: Diagnosis not present

## 2020-03-11 DIAGNOSIS — M5417 Radiculopathy, lumbosacral region: Secondary | ICD-10-CM | POA: Diagnosis not present

## 2020-03-11 DIAGNOSIS — Z4889 Encounter for other specified surgical aftercare: Secondary | ICD-10-CM | POA: Diagnosis not present

## 2020-03-13 NOTE — Telephone Encounter (Signed)
Pt called in and would like to speak with Jinny Blossom about her Repatha .  She stated that her grant on the med had run out and she had to pay for the med.  It was $40.00   Best number - [phone]

## 2020-03-16 NOTE — Telephone Encounter (Signed)
Returned call to pt and left a message. The [employer] is closed for re-enrollment. Pt will need to pay $40 copay to stay on Repatha. Otherwise, can change to ezetimibe which was previously discussed with pt as add on therapy (would be inferior as monotherapy but options are limited if Repatha copay is unaffordable due to previous intolerance to 3 statins and Welchol).

## 2020-03-16 NOTE — Telephone Encounter (Signed)
Pt returned call to clinic and is ok with paying $40 copay to continue on [place].

## 2020-03-18 DIAGNOSIS — R262 Difficulty in walking, not elsewhere classified: Secondary | ICD-10-CM | POA: Diagnosis not present

## 2020-03-18 DIAGNOSIS — M5417 Radiculopathy, lumbosacral region: Secondary | ICD-10-CM | POA: Diagnosis not present

## 2020-03-18 DIAGNOSIS — Z4889 Encounter for other specified surgical aftercare: Secondary | ICD-10-CM | POA: Diagnosis not present

## 2020-03-24 DIAGNOSIS — Z4889 Encounter for other specified surgical aftercare: Secondary | ICD-10-CM | POA: Diagnosis not present

## 2020-03-24 DIAGNOSIS — R262 Difficulty in walking, not elsewhere classified: Secondary | ICD-10-CM | POA: Diagnosis not present

## 2020-03-24 DIAGNOSIS — M5417 Radiculopathy, lumbosacral region: Secondary | ICD-10-CM | POA: Diagnosis not present

## 2020-03-25 DIAGNOSIS — R262 Difficulty in walking, not elsewhere classified: Secondary | ICD-10-CM | POA: Diagnosis not present

## 2020-03-25 DIAGNOSIS — Z4889 Encounter for other specified surgical aftercare: Secondary | ICD-10-CM | POA: Diagnosis not present

## 2020-03-25 DIAGNOSIS — M5417 Radiculopathy, lumbosacral region: Secondary | ICD-10-CM | POA: Diagnosis not present

## 2020-03-30 NOTE — Progress Notes (Signed)
PATIENT: Robyn Aguilar DOB: [birth year]  REASON FOR VISIT: follow up HISTORY FROM: patient  HISTORY OF PRESENT ILLNESS: Today 03/30/20:  Robyn Aguilar is a 69 year old female with a history of obstructive sleep apnea on CPAP.  Her download indicates that she use her machine nightly for compliance of 100%.  She use her machine greater than 4 hours each night.  On average she uses her machine 8 hours and 33 minutes.  Her residual AHI is 0.9 on 5 to 15 cm of water with EPR 3.  Leak in the 95th percentile is 6.2 L/min.  Reports that the CPAP is working well for her.  She returns today for follow-up.  HISTORY 03/27/19:  Robyn Aguilar is a 69 year old female with a history of obstructive sleep apnea on CPAP.  She returns today for follow-up.  Her download indicates that she use her machine nightly for compliance of 100%.  She use her machine greater than 4 hours 29 days for compliance of 97%.  On average she uses her machine 7 hours and 52 minutes.  Her residual AHI is 0.7 on 5 to 15 cm of water with EPR of 3.  Her leak in the 95th percentile is 1.7 L/min.  She reports that the CPAP is working well for her.  She states that she also has migraine headaches.  Her PCP gives her Demerol to treat her headaches.  She reports that she has been on Topamax in the past but was unable to tolerate it.  She states that she has 2-3 migraines a week.  They typically occur in the left frontal region.  She does have photophobia and phonophobia as well as nausea.  Her headaches are typically triggered by weather changes.  She states that this past Saturday and Sunday she had a different type of headache.  It was in the left occipital region and she describes it as a sharp stabbing pain that will last for 20 seconds and then she would have a break and then it may do it again.  With Demerol her symptoms finally resolved.  She returns today for an evaluation.  REVIEW OF SYSTEMS: Out of a complete  14 system review of symptoms, the patient complains only of the following symptoms, and all other reviewed systems are negative.  EUM35 ESS5  ALLERGIES: Allergies  Allergen Reactions  . Codeine Hives  . Statins Other (See Comments)    Muscle pain  . Bempedoic Acid     Increase in uric acid  . Adhesive [Tape] Rash  . Latex Rash    HOME MEDICATIONS: Outpatient Medications Prior to Visit  Medication Sig Dispense Refill  . aspirin EC 81 MG tablet Take 81 mg by mouth daily.    . cetirizine (ZYRTEC) 10 MG tablet Take 10 mg by mouth daily.     . Evolocumab (REPATHA SURECLICK) 361 MG/ML SOAJ Inject 1 pen into the skin every 14 (fourteen) days. 2 mL 11  . fluticasone (FLONASE) 50 MCG/ACT nasal spray Place 1-2 sprays into both nostrils at bedtime.     . gabapentin (NEURONTIN) 300 MG capsule Take 300 mg by mouth daily as needed for pain.    [name] LORazepam (ATIVAN) 0.5 MG tablet Take 0.25-0.5 mg by mouth daily as needed for anxiety.     . metoprolol succinate (TOPROL-XL) 50 MG 24 hr tablet Take 50 mg by mouth daily. Take with or immediately following a meal.    . ondansetron (ZOFRAN) 4 MG tablet Take 1 tablet (4  mg total) by mouth every 8 (eight) hours as needed for nausea or vomiting. 20 tablet 0  . RABEprazole (ACIPHEX) 20 MG tablet Take 20 mg by mouth daily.    [name] triamterene-hydrochlorothiazide (MAXZIDE) 75-50 MG tablet Take 1 tablet by mouth daily.  1   No facility-administered medications prior to visit.    PAST MEDICAL HISTORY: Past Medical History:  Diagnosis Date  . Allergic rhinitis   . Anxiety   . Arthritis    knees and neck  . Cataracts, bilateral    immature   . CHF (congestive heart failure) ([place]) 2014   after surgery  . Complication of anesthesia    had heart failure after knee surgery 02/04/13  . DDD (degenerative disc disease), lumbar   . Diabetes mellitus without complication ([place])   . Dizziness    takes Antivert prn  . GERD (gastroesophageal reflux disease)     takes Aciphex prn  . High cholesterol    but doesn't take any meds  . History of colon polyps   . History of migraine    last one 6-102months ago;takes Topamax nightly  . Hypertension    takes Metoprolol and maxzide daily  . Joint pain   . Joint swelling   . Migraine   . Migraines   . Pneumonia    at age 34  . Seizures ([place])    hx of;last one 61yrs ago;no meds required;states it was brought on by med;only one ever had  . Shortness of breath 02/06/2013  . Sleep apnea    sleep study in epic from 2014-uses CPAP  . Stroke [facility])    2 TIA  . TIA (transient ischemic attack)     PAST SURGICAL HISTORY: Past Surgical History:  Procedure Laterality Date  . ABDOMINAL HYSTERECTOMY  1991  . ANTERIOR LAT LUMBAR FUSION N/A 11/21/2019   Procedure: ANTERIOR LATERAL LUMBAR FUSION (XLIF) with lateral plate Lumbar two through lumbar three;  Surgeon: Melina Schools, MD;  Location: [facility];  Service: Orthopedics;  Laterality: N/A;  3 hrs  . carpel tunnel Right 1988  . COLONOSCOPY    . disc surgery  1996  . ESOPHAGOGASTRODUODENOSCOPY    . EYE SURGERY Bilateral    cataract surgery with lens implants  . HERNIA REPAIR  1987   x 2  . KNEE SURGERY Left 2011   replacement-partial  . LUMBAR LAMINECTOMY/DECOMPRESSION MICRODISCECTOMY Right 07/13/2017   Procedure: Disectomy Right L2-3;  Surgeon: Melina Schools, MD;  Location: [facility];  Service: Orthopedics;  Laterality: Right;  2.5 hrs  . ROBOTIC ASSISTED BILATERAL SALPINGO OOPHERECTOMY Bilateral 10/22/2013   Procedure: ROBOTIC ASSISTED BILATERAL SALPINGO OOPHORECTOMY;  Surgeon: Imagene Gurney A. Alycia Rossetti, MD;  Location: [facility];  Service: Gynecology;  Laterality: Bilateral;  . ROTATOR CUFF REPAIR Right   . TOTAL KNEE ARTHROPLASTY Right   . TOTAL KNEE ARTHROPLASTY Right 02/04/2013   Procedure: RIGHT TOTAL KNEE ARTHROPLASTY;  Surgeon: Vickey Huger, MD;  Location: [facility];  Service: Orthopedics;  Laterality: Right;    FAMILY HISTORY: Family History  Problem Relation Age of  Onset  . Hypertension Mother   . Pancreatic cancer Mother   . Prostate cancer Other     SOCIAL HISTORY: Social History   Socioeconomic History  . Marital status: Married    Spouse name: Not on file  . Number of children: 2  . Years of education: college  . Highest education level: Not on file  Occupational History    Employer: [employer]  Tobacco Use  .  Smoking status: Never Smoker  . Smokeless tobacco: Never Used  Vaping Use  . Vaping Use: Never used  Substance and Sexual Activity  . Alcohol use: No    Alcohol/week: 0.0 standard drinks  . Drug use: No  . Sexual activity: Yes    Birth control/protection: Surgical  Other Topics Concern  . Not on file  Social History Narrative   Lives with husband   Caffeine use:   Social Determinants of Health   Financial Resource Strain:   . Difficulty of Paying Living Expenses: Not on file  Food Insecurity:   . Worried About Charity fundraiser in the Last Year: Not on file  . Ran Out of Food in the Last Year: Not on file  Transportation Needs:   . Lack of Transportation (Medical): Not on file  . Lack of Transportation (Non-Medical): Not on file  Physical Activity:   . Days of Exercise per Week: Not on file  . Minutes of Exercise per Session: Not on file  Stress:   . Feeling of Stress : Not on file  Social Connections:   . Frequency of Communication with Friends and Family: Not on file  . Frequency of Social Gatherings with Friends and Family: Not on file  . Attends Religious Services: Not on file  . Active Member of Clubs or Organizations: Not on file  . Attends Archivist Meetings: Not on file  . Marital Status: Not on file  Intimate Partner Violence:   . Fear of Current or Ex-Partner: Not on file  . Emotionally Abused: Not on file  . Physically Abused: Not on file  . Sexually Abused: Not on file      PHYSICAL EXAM  Vitals:   03/31/20 0937  BP: 118/84  Weight: 217 lb 12.8 oz (98.8 kg)    Height: 5\' 4"  (1.626 m)   Body mass index is 37.39 kg/m.  Generalized: Well developed, in no acute distress  Chest: Lungs clear to auscultation bilaterally  Neurological examination  Mentation: Alert oriented to time, place, history taking. Follows all commands speech and language fluent Cranial nerve II-XII: Extraocular movements were full, visual field were full on confrontational test Head turning and shoulder shrug  were normal and symmetric. Motor: The motor testing reveals 5 over 5 strength of all 4 extremities. Good symmetric motor tone is noted throughout.  Sensory: Sensory testing is intact to soft touch on all 4 extremities. No evidence of extinction is noted.  Gait and station: Gait is normal.    DIAGNOSTIC DATA (LABS, IMAGING, TESTING) - I reviewed patient records, labs, notes, testing and imaging myself where available.  Lab Results  Component Value Date   WBC 3.7 (L) 11/14/2019   HGB 13.7 11/14/2019   HCT 43.4 11/14/2019   MCV 84.3 11/14/2019   PLT 277 11/14/2019      Component Value Date/Time   NA 139 11/14/2019 0955   K 3.5 11/14/2019 0955   CL 101 11/14/2019 0955   CO2 28 11/14/2019 0955   GLUCOSE 115 (H) 11/14/2019 0955   BUN 20 11/14/2019 0955   CREATININE 1.00 11/14/2019 0955   CALCIUM 9.8 11/14/2019 0955   PROT 7.1 02/24/2020 0827   ALBUMIN 4.6 02/24/2020 0827   AST 31 02/24/2020 0827   ALT 28 02/24/2020 0827   ALKPHOS 108 02/24/2020 0827   BILITOT 0.5 02/24/2020 0827   GFRNONAA 57 (L) 11/14/2019 0955   GFRAA >60 11/14/2019 0955   Lab Results  Component Value Date  CHOL 190 02/24/2020   HDL 49 02/24/2020   LDLCALC 115 (H) 02/24/2020   TRIG 144 02/24/2020   CHOLHDL 3.9 02/24/2020   Lab Results  Component Value Date   HGBA1C 6.9 (H) 11/14/2019   No results found for: [id number] Lab Results  Component Value Date   TSH 0.556 02/07/2013      ASSESSMENT AND PLAN 69 y.o. year old female  has a past medical history of Allergic  rhinitis, Anxiety, Arthritis, Cataracts, bilateral, CHF (congestive heart failure) ([place]) (8875), Complication of anesthesia, DDD (degenerative disc disease), lumbar, Diabetes mellitus without complication ([place]), Dizziness, GERD (gastroesophageal reflux disease), High cholesterol, History of colon polyps, History of migraine, Hypertension, Joint pain, Joint swelling, Migraine, Migraines, Pneumonia, Seizures ([place]), Shortness of breath (02/06/2013), Sleep apnea, Stroke ([place]), and TIA (transient ischemic attack). here with:  1. OSA on CPAP  - CPAP compliance excellent - Good treatment of AHI  - Encourage patient to use CPAP nightly and > 4 hours each night - F/U in 1 year or sooner if needed   I spent 25  minutes of face-to-face and non-face-to-face time with patient.  This included previsit chart review, lab review, study review, order entry, electronic health record documentation, patient education.  Ward Givens, MSN, NP-C 03/30/2020, 3:59 PM [facility] [address] [place], [place] [address] [phone]

## 2020-03-31 ENCOUNTER — Ambulatory Visit: Payer: Medicare PPO | Admitting: Adult Health

## 2020-03-31 ENCOUNTER — Encounter: Payer: Self-pay | Admitting: Adult Health

## 2020-03-31 VITALS — BP 118/84 | Ht 64.0 in | Wt 217.8 lb

## 2020-03-31 DIAGNOSIS — Z9989 Dependence on other enabling machines and devices: Secondary | ICD-10-CM

## 2020-03-31 DIAGNOSIS — G4733 Obstructive sleep apnea (adult) (pediatric): Secondary | ICD-10-CM | POA: Diagnosis not present

## 2020-03-31 NOTE — Patient Instructions (Signed)

## 2020-04-02 DIAGNOSIS — M79605 Pain in left leg: Secondary | ICD-10-CM | POA: Diagnosis not present

## 2020-04-08 DIAGNOSIS — M5417 Radiculopathy, lumbosacral region: Secondary | ICD-10-CM | POA: Diagnosis not present

## 2020-04-08 DIAGNOSIS — R262 Difficulty in walking, not elsewhere classified: Secondary | ICD-10-CM | POA: Diagnosis not present

## 2020-04-08 DIAGNOSIS — Z4889 Encounter for other specified surgical aftercare: Secondary | ICD-10-CM | POA: Diagnosis not present

## 2020-04-09 DIAGNOSIS — E559 Vitamin D deficiency, unspecified: Secondary | ICD-10-CM | POA: Diagnosis not present

## 2020-04-09 DIAGNOSIS — E785 Hyperlipidemia, unspecified: Secondary | ICD-10-CM | POA: Diagnosis not present

## 2020-04-09 DIAGNOSIS — G720 Drug-induced myopathy: Secondary | ICD-10-CM | POA: Diagnosis not present

## 2020-04-09 DIAGNOSIS — I10 Essential (primary) hypertension: Secondary | ICD-10-CM | POA: Diagnosis not present

## 2020-04-09 DIAGNOSIS — K219 Gastro-esophageal reflux disease without esophagitis: Secondary | ICD-10-CM | POA: Diagnosis not present

## 2020-04-09 DIAGNOSIS — Z79899 Other long term (current) drug therapy: Secondary | ICD-10-CM | POA: Diagnosis not present

## 2020-04-09 DIAGNOSIS — E1169 Type 2 diabetes mellitus with other specified complication: Secondary | ICD-10-CM | POA: Diagnosis not present

## 2020-04-09 DIAGNOSIS — G43909 Migraine, unspecified, not intractable, without status migrainosus: Secondary | ICD-10-CM | POA: Diagnosis not present

## 2020-04-27 DIAGNOSIS — R262 Difficulty in walking, not elsewhere classified: Secondary | ICD-10-CM | POA: Diagnosis not present

## 2020-04-27 DIAGNOSIS — M5417 Radiculopathy, lumbosacral region: Secondary | ICD-10-CM | POA: Diagnosis not present

## 2020-04-27 DIAGNOSIS — Z4889 Encounter for other specified surgical aftercare: Secondary | ICD-10-CM | POA: Diagnosis not present

## 2020-04-30 DIAGNOSIS — Z4889 Encounter for other specified surgical aftercare: Secondary | ICD-10-CM | POA: Diagnosis not present

## 2020-04-30 DIAGNOSIS — R262 Difficulty in walking, not elsewhere classified: Secondary | ICD-10-CM | POA: Diagnosis not present

## 2020-04-30 DIAGNOSIS — M5417 Radiculopathy, lumbosacral region: Secondary | ICD-10-CM | POA: Diagnosis not present

## 2020-05-03 DIAGNOSIS — M79605 Pain in left leg: Secondary | ICD-10-CM | POA: Diagnosis not present

## 2020-05-13 DIAGNOSIS — H26491 Other secondary cataract, right eye: Secondary | ICD-10-CM | POA: Diagnosis not present

## 2020-05-13 DIAGNOSIS — M5417 Radiculopathy, lumbosacral region: Secondary | ICD-10-CM | POA: Diagnosis not present

## 2020-05-13 DIAGNOSIS — E119 Type 2 diabetes mellitus without complications: Secondary | ICD-10-CM | POA: Diagnosis not present

## 2020-05-13 DIAGNOSIS — H526 Other disorders of refraction: Secondary | ICD-10-CM | POA: Diagnosis not present

## 2020-05-13 DIAGNOSIS — R262 Difficulty in walking, not elsewhere classified: Secondary | ICD-10-CM | POA: Diagnosis not present

## 2020-05-13 DIAGNOSIS — Z961 Presence of intraocular lens: Secondary | ICD-10-CM | POA: Diagnosis not present

## 2020-05-13 DIAGNOSIS — H04123 Dry eye syndrome of bilateral lacrimal glands: Secondary | ICD-10-CM | POA: Diagnosis not present

## 2020-05-13 DIAGNOSIS — Z4889 Encounter for other specified surgical aftercare: Secondary | ICD-10-CM | POA: Diagnosis not present

## 2020-05-15 DIAGNOSIS — Z4889 Encounter for other specified surgical aftercare: Secondary | ICD-10-CM | POA: Diagnosis not present

## 2020-05-15 DIAGNOSIS — R262 Difficulty in walking, not elsewhere classified: Secondary | ICD-10-CM | POA: Diagnosis not present

## 2020-05-15 DIAGNOSIS — M5417 Radiculopathy, lumbosacral region: Secondary | ICD-10-CM | POA: Diagnosis not present

## 2020-05-18 ENCOUNTER — Other Ambulatory Visit: Payer: Medicare PPO

## 2020-05-21 ENCOUNTER — Other Ambulatory Visit: Payer: Medicare PPO

## 2020-05-22 DIAGNOSIS — Z4889 Encounter for other specified surgical aftercare: Secondary | ICD-10-CM | POA: Diagnosis not present

## 2020-05-22 DIAGNOSIS — R262 Difficulty in walking, not elsewhere classified: Secondary | ICD-10-CM | POA: Diagnosis not present

## 2020-05-22 DIAGNOSIS — M5417 Radiculopathy, lumbosacral region: Secondary | ICD-10-CM | POA: Diagnosis not present

## 2020-05-26 ENCOUNTER — Other Ambulatory Visit: Payer: Medicare PPO | Admitting: *Deleted

## 2020-05-26 ENCOUNTER — Other Ambulatory Visit: Payer: Self-pay

## 2020-05-26 DIAGNOSIS — E782 Mixed hyperlipidemia: Secondary | ICD-10-CM | POA: Diagnosis not present

## 2020-05-26 LAB — HEPATIC FUNCTION PANEL
ALT: 29 IU/L (ref 0–32)
AST: 29 IU/L (ref 0–40)
Albumin: 4.6 g/dL (ref 3.8–4.8)
Alkaline Phosphatase: 110 IU/L (ref 44–121)
Bilirubin Total: 0.4 mg/dL (ref 0.0–1.2)
Bilirubin, Direct: 0.16 mg/dL (ref 0.00–0.40)
Total Protein: 7.1 g/dL (ref 6.0–8.5)

## 2020-05-26 LAB — LIPID PANEL
Chol/HDL Ratio: 3.3 ratio (ref 0.0–4.4)
Cholesterol, Total: 202 mg/dL — ABNORMAL HIGH (ref 100–199)
HDL: 61 mg/dL (ref >39)
LDL Chol Calc (NIH): 120 mg/dL — ABNORMAL HIGH (ref 0–99)
Triglycerides: 116 mg/dL (ref 0–149)
VLDL Cholesterol Cal: 21 mg/dL (ref 5–40)

## 2020-05-27 ENCOUNTER — Telehealth: Payer: Self-pay | Admitting: Pharmacist

## 2020-05-27 DIAGNOSIS — E782 Mixed hyperlipidemia: Secondary | ICD-10-CM

## 2020-05-27 MED ORDER — EZETIMIBE 10 MG PO TABS: 10.0000 mg | ORAL_TABLET | Freq: Every day | ORAL | 3 refills | Status: DC

## 2020-05-27 NOTE — Telephone Encounter (Signed)
Spoke with pt about results. She continues on Repatha, baseline LDL as high as 245. Current LDL still above goal < 70 given hx of TIA. Intolerant to multiple statins and bempedoic acid. After much discussion, pt eventually agreeable to adding ezetimibe 10mg  daily, [employer] grant info added to rx. She will continue on PCKS9i. Rechecking labs in 3 months.

## 2020-05-28 DIAGNOSIS — M5417 Radiculopathy, lumbosacral region: Secondary | ICD-10-CM | POA: Diagnosis not present

## 2020-05-28 DIAGNOSIS — R262 Difficulty in walking, not elsewhere classified: Secondary | ICD-10-CM | POA: Diagnosis not present

## 2020-05-28 DIAGNOSIS — Z4889 Encounter for other specified surgical aftercare: Secondary | ICD-10-CM | POA: Diagnosis not present

## 2020-06-02 DIAGNOSIS — R262 Difficulty in walking, not elsewhere classified: Secondary | ICD-10-CM | POA: Diagnosis not present

## 2020-06-02 DIAGNOSIS — Z4889 Encounter for other specified surgical aftercare: Secondary | ICD-10-CM | POA: Diagnosis not present

## 2020-06-02 DIAGNOSIS — M5417 Radiculopathy, lumbosacral region: Secondary | ICD-10-CM | POA: Diagnosis not present

## 2020-06-03 DIAGNOSIS — M25511 Pain in right shoulder: Secondary | ICD-10-CM | POA: Diagnosis not present

## 2020-06-04 ENCOUNTER — Telehealth: Payer: Self-pay | Admitting: Pharmacist

## 2020-06-04 DIAGNOSIS — M5417 Radiculopathy, lumbosacral region: Secondary | ICD-10-CM | POA: Diagnosis not present

## 2020-06-04 DIAGNOSIS — Z4889 Encounter for other specified surgical aftercare: Secondary | ICD-10-CM | POA: Diagnosis not present

## 2020-06-04 DIAGNOSIS — R262 Difficulty in walking, not elsewhere classified: Secondary | ICD-10-CM | POA: Diagnosis not present

## 2020-06-04 NOTE — Telephone Encounter (Signed)
Patient called stating her Repatha froze in the fridge. She has called [employer] for a replacement but wont be here until Tuesday. She was supposed to take on Saturday. Asking for a sample. She will pick up sample tomorrow

## 2020-06-05 ENCOUNTER — Encounter: Payer: Self-pay | Admitting: Adult Health

## 2020-06-05 MED ORDER — REPATHA SURECLICK 140 MG/ML ~~LOC~~ SOAJ: 1.0000 "pen " | SUBCUTANEOUS | 0 refills | Status: DC

## 2020-06-05 NOTE — Addendum Note (Signed)
Addended by: Marcelle Overlie D on: 06/05/2020 09:12 AM   Modules accepted: Orders

## 2020-06-11 DIAGNOSIS — M5417 Radiculopathy, lumbosacral region: Secondary | ICD-10-CM | POA: Diagnosis not present

## 2020-06-11 DIAGNOSIS — R262 Difficulty in walking, not elsewhere classified: Secondary | ICD-10-CM | POA: Diagnosis not present

## 2020-06-11 DIAGNOSIS — Z4889 Encounter for other specified surgical aftercare: Secondary | ICD-10-CM | POA: Diagnosis not present

## 2020-06-16 DIAGNOSIS — M5417 Radiculopathy, lumbosacral region: Secondary | ICD-10-CM | POA: Diagnosis not present

## 2020-06-16 DIAGNOSIS — Z4889 Encounter for other specified surgical aftercare: Secondary | ICD-10-CM | POA: Diagnosis not present

## 2020-06-16 DIAGNOSIS — R262 Difficulty in walking, not elsewhere classified: Secondary | ICD-10-CM | POA: Diagnosis not present

## 2020-06-30 DIAGNOSIS — Z4889 Encounter for other specified surgical aftercare: Secondary | ICD-10-CM | POA: Diagnosis not present

## 2020-06-30 DIAGNOSIS — R262 Difficulty in walking, not elsewhere classified: Secondary | ICD-10-CM | POA: Diagnosis not present

## 2020-06-30 DIAGNOSIS — M5417 Radiculopathy, lumbosacral region: Secondary | ICD-10-CM | POA: Diagnosis not present

## 2020-07-01 DIAGNOSIS — M25511 Pain in right shoulder: Secondary | ICD-10-CM | POA: Diagnosis not present

## 2020-08-01 DIAGNOSIS — M25511 Pain in right shoulder: Secondary | ICD-10-CM | POA: Diagnosis not present

## 2020-08-03 DIAGNOSIS — R059 Cough, unspecified: Secondary | ICD-10-CM | POA: Diagnosis not present

## 2020-08-03 DIAGNOSIS — J018 Other acute sinusitis: Secondary | ICD-10-CM | POA: Diagnosis not present

## 2020-08-03 DIAGNOSIS — Z03818 Encounter for observation for suspected exposure to other biological agents ruled out: Secondary | ICD-10-CM | POA: Diagnosis not present

## 2020-08-24 ENCOUNTER — Other Ambulatory Visit: Payer: Medicare PPO | Admitting: *Deleted

## 2020-08-24 ENCOUNTER — Other Ambulatory Visit: Payer: Self-pay

## 2020-08-24 DIAGNOSIS — E782 Mixed hyperlipidemia: Secondary | ICD-10-CM

## 2020-08-24 LAB — HEPATIC FUNCTION PANEL
ALT: 35 IU/L — ABNORMAL HIGH (ref 0–32)
AST: 33 IU/L (ref 0–40)
Albumin: 4.6 g/dL (ref 3.8–4.8)
Alkaline Phosphatase: 97 IU/L (ref 44–121)
Bilirubin Total: 0.5 mg/dL (ref 0.0–1.2)
Bilirubin, Direct: 0.18 mg/dL (ref 0.00–0.40)
Total Protein: 7.3 g/dL (ref 6.0–8.5)

## 2020-08-24 LAB — LIPID PANEL
Chol/HDL Ratio: 2.7 ratio (ref 0.0–4.4)
Cholesterol, Total: 146 mg/dL (ref 100–199)
HDL: 54 mg/dL (ref >39)
LDL Chol Calc (NIH): 72 mg/dL (ref 0–99)
Triglycerides: 109 mg/dL (ref 0–149)
VLDL Cholesterol Cal: 20 mg/dL (ref 5–40)

## 2020-08-31 DIAGNOSIS — M25511 Pain in right shoulder: Secondary | ICD-10-CM | POA: Diagnosis not present

## 2020-09-08 ENCOUNTER — Telehealth: Payer: Self-pay | Admitting: Interventional Cardiology

## 2020-09-08 DIAGNOSIS — M1712 Unilateral primary osteoarthritis, left knee: Secondary | ICD-10-CM | POA: Diagnosis not present

## 2020-09-08 DIAGNOSIS — Z471 Aftercare following joint replacement surgery: Secondary | ICD-10-CM | POA: Diagnosis not present

## 2020-09-08 NOTE — Telephone Encounter (Signed)
Pt c/o medication issue:  1. Name of Medication: meloxicam  2. How are you currently taking this medication (dosage and times per day)? Has not started  3. Are you having a reaction (difficulty breathing--STAT)? no  4. What is your medication issue? Patient states she fell in her shower yesterday and she has a lot of arthritis in both knees. She states she saw her orthopaedic doctor Dr. Alferd Apa in [place], and he wants to put her on an antiinflammatory medication called Meloxicam. She states she was told to contact her PCP about and they said they did not want her to take it, because of her heart condition. She would like to know if Dr. [name] can tell her of something else she can take for the arthritis and the pain.

## 2020-09-08 NOTE — Telephone Encounter (Signed)
Will route to Dr. [name] to review if ok for pt to use Meloxicam or if there is an alternative he recommends.

## 2020-09-09 NOTE — Telephone Encounter (Signed)
Tylenol for now. Non-steroidal agents increase risk odf clotting.

## 2020-09-10 NOTE — Telephone Encounter (Signed)
Spoke with pt and made her aware of recommendations.  Pt agreeable to plan.

## 2020-09-29 DIAGNOSIS — Z981 Arthrodesis status: Secondary | ICD-10-CM | POA: Diagnosis not present

## 2020-09-29 DIAGNOSIS — Z4889 Encounter for other specified surgical aftercare: Secondary | ICD-10-CM | POA: Diagnosis not present

## 2020-10-01 ENCOUNTER — Emergency Department (HOSPITAL_COMMUNITY)
Admission: EM | Admit: 2020-10-01 | Discharge: 2020-10-01 | Disposition: A | Discharge status: Home / Self Care | Payer: Medicare PPO | Attending: Emergency Medicine | Admitting: Emergency Medicine

## 2020-10-01 ENCOUNTER — Emergency Department (HOSPITAL_COMMUNITY): Payer: Medicare PPO

## 2020-10-01 ENCOUNTER — Encounter (HOSPITAL_COMMUNITY): Payer: Self-pay | Admitting: Emergency Medicine

## 2020-10-01 DIAGNOSIS — Z96651 Presence of right artificial knee joint: Secondary | ICD-10-CM | POA: Insufficient documentation

## 2020-10-01 DIAGNOSIS — M5459 Other low back pain: Secondary | ICD-10-CM | POA: Diagnosis not present

## 2020-10-01 DIAGNOSIS — G4733 Obstructive sleep apnea (adult) (pediatric): Secondary | ICD-10-CM | POA: Diagnosis not present

## 2020-10-01 DIAGNOSIS — I110 Hypertensive heart disease with heart failure: Secondary | ICD-10-CM | POA: Insufficient documentation

## 2020-10-01 DIAGNOSIS — E119 Type 2 diabetes mellitus without complications: Secondary | ICD-10-CM | POA: Insufficient documentation

## 2020-10-01 DIAGNOSIS — Z91040 Latex allergy status: Secondary | ICD-10-CM | POA: Insufficient documentation

## 2020-10-01 DIAGNOSIS — Z7982 Long term (current) use of aspirin: Secondary | ICD-10-CM | POA: Insufficient documentation

## 2020-10-01 DIAGNOSIS — G8929 Other chronic pain: Secondary | ICD-10-CM | POA: Diagnosis not present

## 2020-10-01 DIAGNOSIS — M545 Low back pain, unspecified: Secondary | ICD-10-CM

## 2020-10-01 DIAGNOSIS — I5032 Chronic diastolic (congestive) heart failure: Secondary | ICD-10-CM | POA: Insufficient documentation

## 2020-10-01 DIAGNOSIS — Z79899 Other long term (current) drug therapy: Secondary | ICD-10-CM | POA: Insufficient documentation

## 2020-10-01 LAB — URINALYSIS, ROUTINE W REFLEX MICROSCOPIC
Bilirubin Urine: NEGATIVE
Glucose, UA: NEGATIVE mg/dL
Hgb urine dipstick: NEGATIVE
Ketones, ur: NEGATIVE mg/dL
Leukocytes,Ua: NEGATIVE
Nitrite: NEGATIVE
Protein, ur: NEGATIVE mg/dL
Specific Gravity, Urine: 1.011 (ref 1.005–1.030)
pH: 7 (ref 5.0–8.0)

## 2020-10-01 LAB — CBC WITH DIFFERENTIAL/PLATELET
Abs Immature Granulocytes: 0.03 10*3/uL (ref 0.00–0.07)
Basophils Absolute: 0 10*3/uL (ref 0.0–0.1)
Basophils Relative: 0 %
Eosinophils Absolute: 0 10*3/uL (ref 0.0–0.5)
Eosinophils Relative: 0 %
HCT: 44.8 % (ref 36.0–46.0)
Hemoglobin: 14.6 g/dL (ref 12.0–15.0)
Immature Granulocytes: 0 %
Lymphocytes Relative: 26 %
Lymphs Abs: 1.8 10*3/uL (ref 0.7–4.0)
MCH: 27.4 pg (ref 26.0–34.0)
MCHC: 32.6 g/dL (ref 30.0–36.0)
MCV: 84.1 fL (ref 80.0–100.0)
Monocytes Absolute: 0.5 10*3/uL (ref 0.1–1.0)
Monocytes Relative: 8 %
Neutro Abs: 4.5 10*3/uL (ref 1.7–7.7)
Neutrophils Relative %: 66 %
Platelets: 293 10*3/uL (ref 150–400)
RBC: 5.33 MIL/uL — ABNORMAL HIGH (ref 3.87–5.11)
RDW: 16.2 % — ABNORMAL HIGH (ref 11.5–15.5)
WBC: 6.9 10*3/uL (ref 4.0–10.5)
nRBC: 0 % (ref 0.0–0.2)

## 2020-10-01 LAB — BASIC METABOLIC PANEL
Anion gap: 11 (ref 5–15)
BUN: 23 mg/dL (ref 8–23)
CO2: 28 mmol/L (ref 22–32)
Calcium: 9.9 mg/dL (ref 8.9–10.3)
Chloride: 100 mmol/L (ref 98–111)
Creatinine, Ser: 1.04 mg/dL — ABNORMAL HIGH (ref 0.44–1.00)
GFR, Estimated: 58 mL/min — ABNORMAL LOW (ref >60)
Glucose, Bld: 174 mg/dL — ABNORMAL HIGH (ref 70–99)
Potassium: 3.2 mmol/L — ABNORMAL LOW (ref 3.5–5.1)
Sodium: 139 mmol/L (ref 135–145)

## 2020-10-01 MED ORDER — OXYCODONE-ACETAMINOPHEN 5-325 MG PO TABS: 1.0000 | ORAL_TABLET | Freq: Four times a day (QID) | ORAL | 0 refills | Status: DC | PRN

## 2020-10-01 MED ORDER — GADOBUTROL 1 MMOL/ML IV SOLN
10.0000 mL | Freq: Once | INTRAVENOUS | Status: AC | PRN
  Administered 2020-10-01: 10 mL via INTRAVENOUS

## 2020-10-01 MED ORDER — FENTANYL CITRATE (PF) 100 MCG/2ML IJ SOLN
50.0000 ug | Freq: Once | INTRAMUSCULAR | Status: AC
  Administered 2020-10-01: 50 ug via INTRAVENOUS
  Filled 2020-10-01: qty 2

## 2020-10-01 MED ORDER — ONDANSETRON HCL 4 MG/2ML IJ SOLN
4.0000 mg | Freq: Once | INTRAMUSCULAR | Status: AC
  Administered 2020-10-01: 4 mg via INTRAVENOUS
  Filled 2020-10-01: qty 2

## 2020-10-01 NOTE — ED Provider Notes (Signed)
[facility] Provider Note   CSN: [id number] Arrival date & time: 10/01/20  1026     History Chief Complaint  Patient presents with  . Back Pain    Robyn Aguilar is a 70 y.o. female.  HPI   Patient presents with low back pain worsening the last 3 days.  Started 2 to 3 weeks ago and is gotten significantly worse.  Pain is constant severe worse with movement and ambulation.   History of lumbar fusion in July 2021. She has has not had pain like this since before the surgery.  Patient was seen in the office of her neurosurgeon on Monday.  She started on a prednisone Dosepak.  She reports some improvement of the pain, but is unable to ambulate without walker or severe pain.  She called her neurosurgeon earlier  today and was told to follow-up in the ED for further imaging. she denies any fevers, chills, saddle anesthesia, dysuria, IV drug use. She fell 1 week ago, but does not believe this is related to the aggravation of her pain.    Past Medical History:  Diagnosis Date  . Allergic rhinitis   . Anxiety   . Arthritis    knees and neck  . Cataracts, bilateral    immature   . CHF (congestive heart failure) ([place]) 2014   after surgery  . Complication of anesthesia    had heart failure after knee surgery 02/04/13  . DDD (degenerative disc disease), lumbar   . Diabetes mellitus without complication ([place])   . Dizziness    takes Antivert prn  . GERD (gastroesophageal reflux disease)    takes Aciphex prn  . High cholesterol    but doesn't take any meds  . History of colon polyps   . History of migraine    last one 6-45months ago;takes Topamax nightly  . Hypertension    takes Metoprolol and maxzide daily  . Joint pain   . Joint swelling   . Migraine   . Migraines   . Pneumonia    at age 57  . Seizures ([place])    hx of;last one 67yrs ago;no meds required;states it was brought on by med;only one ever had  . Shortness of breath  02/06/2013  . Sleep apnea    sleep study in epic from 2014-uses CPAP  . Stroke [facility])    2 TIA  . TIA (transient ischemic attack)     Patient Active Problem List   Diagnosis Date Noted  . Lumbar disc herniation 11/21/2019  . Morbid obesity ([place]) 03/22/2018  . Cerebral microvascular disease 03/22/2018  . Retrognathia 12/24/2017  . OSA on CPAP 12/24/2017  . Status post lumbar spine operative procedure for decompression of spinal cord 07/13/2017  . 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 Pelvic mass in female 10/22/2013  . Chest tightness 03/08/2013    Class: Acute  . Chronic diastolic heart failure ([place]) 03/08/2013    Class: Chronic  . Acute respiratory failure with hypoxia ([place]) 02/06/2013  . Pleural effusion 02/06/2013  . S/P right total knee arthroplasty 02/06/2013  . Hypertension 02/06/2013  . Seizure ([place]) 02/06/2013  . Hyperlipidemia 02/06/2013  . Migraines 02/06/2013  . Pain in joint, shoulder region 01/08/2013  . Migraine headache 09/12/2012  . Intermittent sleepiness 09/12/2012    Past Surgical History:  Procedure Laterality Date  . ABDOMINAL HYSTERECTOMY  1991  . ANTERIOR LAT LUMBAR FUSION N/A 11/21/2019   Procedure: ANTERIOR LATERAL LUMBAR FUSION (XLIF) with lateral  plate Lumbar two through lumbar three;  Surgeon: Melina Schools, MD;  Location: [facility];  Service: Orthopedics;  Laterality: N/A;  3 hrs  . carpel tunnel Right 1988  . COLONOSCOPY    . disc surgery  1996  . ESOPHAGOGASTRODUODENOSCOPY    . EYE SURGERY Bilateral    cataract surgery with lens implants  . HERNIA REPAIR  1987   x 2  . KNEE SURGERY Left 2011   replacement-partial  . LUMBAR LAMINECTOMY/DECOMPRESSION MICRODISCECTOMY Right 07/13/2017   Procedure: Disectomy Right L2-3;  Surgeon: Melina Schools, MD;  Location: [facility];  Service: Orthopedics;  Laterality: Right;  2.5 hrs  . ROBOTIC ASSISTED BILATERAL SALPINGO OOPHERECTOMY Bilateral 10/22/2013   Procedure: ROBOTIC ASSISTED  BILATERAL SALPINGO OOPHORECTOMY;  Surgeon: Imagene Gurney A. Alycia Rossetti, MD;  Location: [facility];  Service: Gynecology;  Laterality: Bilateral;  . ROTATOR CUFF REPAIR Right   . TOTAL KNEE ARTHROPLASTY Right   . TOTAL KNEE ARTHROPLASTY Right 02/04/2013   Procedure: RIGHT TOTAL KNEE ARTHROPLASTY;  Surgeon: Vickey Huger, MD;  Location: [facility];  Service: Orthopedics;  Laterality: Right;     OB History   No obstetric history on file.     Family History  Problem Relation Age of Onset  . Hypertension Mother   . Pancreatic cancer Mother   . Prostate cancer Other     Social History   Tobacco Use  . Smoking status: Never Smoker  . Smokeless tobacco: Never Used  Vaping Use  . Vaping Use: Never used  Substance Use Topics  . Alcohol use: No    Alcohol/week: 0.0 standard drinks  . Drug use: No    Home Medications Prior to Admission medications   Medication Sig Start Date End Date Taking? Authorizing Provider  aspirin EC 81 MG tablet Take 81 mg by mouth daily.    [provider]  cetirizine (ZYRTEC) 10 MG tablet Take 10 mg by mouth daily.  11/05/14   [provider]  Evolocumab (REPATHA SURECLICK) 093 MG/ML SOAJ Inject 1 pen into the skin every 14 (fourteen) days. 01/08/20   Belva Crome, MD  ezetimibe (ZETIA) 10 MG tablet Take 1 tablet (10 mg total) by mouth daily. 05/27/20 05/22/21  Belva Crome, MD  fluticasone (FLONASE) 50 MCG/ACT nasal spray Place 1-2 sprays into both nostrils at bedtime.  08/26/14   [provider]  LORazepam (ATIVAN) 0.5 MG tablet Take 0.25-0.5 mg by mouth daily as needed for anxiety.  01/09/19   [provider]  metoprolol succinate (TOPROL-XL) 50 MG 24 hr tablet Take 50 mg by mouth daily. Take with or immediately following a meal.    [provider]  RABEprazole (ACIPHEX) 20 MG tablet Take 20 mg by mouth daily.    [provider]  triamterene-hydrochlorothiazide (MAXZIDE) 75-50 MG tablet Take 1 tablet by mouth daily. 01/29/15    [provider]    Allergies    Codeine, Statins, Bempedoic acid, Adhesive [tape], and Latex  Review of Systems   Review of Systems  Constitutional: Negative for chills and fever.  HENT: Negative for ear pain and sore throat.   Eyes: Negative for pain and visual disturbance.  Respiratory: Negative for cough and shortness of breath.   Cardiovascular: Negative for chest pain, palpitations and leg swelling.  Gastrointestinal: Negative for abdominal pain, diarrhea, nausea and vomiting.  Genitourinary: Negative for dysuria and hematuria.  Musculoskeletal: Positive for back pain and gait problem. Negative for arthralgias.  Skin: Negative for color change and rash.  Neurological:  Negative for seizures and syncope.  All other systems reviewed and are negative.   Physical Exam Updated Vital Signs BP (!) 144/83   Pulse 80   Temp 98.3 F (36.8 C) (Oral)   Resp 16   SpO2 98%   Physical Exam Vitals and nursing note reviewed. Exam conducted with a chaperone present.  Constitutional:      Appearance: Normal appearance. She is obese.     Comments: Patient in wheel chair. Able to stand and be assisted to bed via NT staff. She is very uncomfortable  HENT:     Head: Normocephalic and atraumatic.  Eyes:     General: No scleral icterus.       Right eye: No discharge.        Left eye: No discharge.     Extraocular Movements: Extraocular movements intact.     Pupils: Pupils are equal, round, and reactive to light.  Cardiovascular:     Rate and Rhythm: Normal rate and regular rhythm.     Pulses: Normal pulses.     Heart sounds: Normal heart sounds. No murmur heard. No friction rub. No gallop.      Comments: DP and PT 2+ bilaterally  Pulmonary:     Effort: Pulmonary effort is normal. No respiratory distress.     Breath sounds: Normal breath sounds.  Abdominal:     General: Abdomen is flat. Bowel sounds are normal. There is no distension.     Palpations: Abdomen is soft.      Tenderness: There is no abdominal tenderness.  Musculoskeletal:        General: Normal range of motion.     Cervical back: Normal range of motion and neck supple. No rigidity.     Comments: All ROM induces pain. She is tender to lumbar spine over previous fusion scar. No fluctuance, rash or heat over the area. No bony tenderness.   Skin:    General: Skin is warm and dry.     Coloration: Skin is not jaundiced.  Neurological:     General: No focal deficit present.     Mental Status: She is alert and oriented to person, place, and time. Mental status is at baseline.     Cranial Nerves: No cranial nerve deficit.     Coordination: Coordination normal.     Comments: CN III-XII grossly intact. Grip strength equal bilatearlly. LE strength equal bilaterally.     ED Results / Procedures / Treatments   Labs (all labs ordered are listed, but only abnormal results are displayed) Labs Reviewed - No data to display  EKG None  Radiology No results found.  Procedures Procedures   Medications Ordered in ED Medications - No data to display  ED Course  I have reviewed the triage vital signs and the nursing notes.  Pertinent labs & imaging results that were available during my care of the patient were reviewed by me and considered in my medical decision making (see chart for details).  Clinical Course as of 10/01/20 1523  Thu Oct 01, 2020  1417 Hemoglobin: 14.6 Not anemic [HS]  1417 WBC: 6.9 Not elevated suggestion infection  [HS]  1455 Patient reports her pain is significantly improved but states she is concerned and wants to be admitted. Will walk the patient. If she can ambulate she is reasonable for discharge with close follow up and pain control. [HS]    Clinical Course User Index [HS] Sherrill Raring, PA-C   MDM Rules/Calculators/A&P  Patient is 70 year old female presenting with back pain.  Vitals are stable, patient is nontoxic-appearing.  Physical exam  shows significant back pain.  She has not improved on prednisone taper.  She has no focal deficits on exam.  No obvious mechanical cause of pain. Low suspicion for spinal abscess, cauda equina, CNS infection, stroke or TIA.   Lab work and imaging are reassuring but without obvious etiology for her pain.   Discussed finding with patient and she believes Dr. Rolena Infante told her she should admitted for pain.  I've tried calling [facility] for further advise but have not heard back yet. I'm not convinced she meets admission criteria based on findings done today during my workup.   I have not received a call back from the [facility].  I discussed the results with the patient.  She is relieved by the findings but frustrated that there is not an identifiable cause for her acute pain.  She says she is able to ambulate now and that her pain is under control with the opioids given to her in the ED.  She is worried about discharge and if things change tomorrow.  I discussed pain control with the patient as well as close follow-up with [facility].  She is in agreement that she will call Dr. Rolena Infante for an appointment tomorrow.  Strict return precautions were given.  Patient is agreeable to plan.  Final Clinical Impression(s) / ED Diagnoses Final diagnoses:  None    Rx / DC Orders ED Discharge Orders    None       Sherrill Raring, PA-C 10/01/20 1527    Malvin Johns, MD 10/01/20 1542

## 2020-10-01 NOTE — ED Triage Notes (Signed)
Patient complains of back pain that started three weeks ago that she states has become too severe to care for herself, stating she is now unable to ambulate or bathe herself. History of back surgery July 2021. States she was started on prednisone three days ago for pain. Patient alert, oriented, and in no apparent distress at this time.

## 2020-10-01 NOTE — Discharge Instructions (Addendum)
You were seen today in the ED for back pain.  Your work-up was very reassuring.  The MRI of your lumbar spine did not show anything acute, emergent, explanatory of the pain you are feeling.  You have spinal stenosis and findings consistent with previous surgeries to your back. Please follow-up with Dr. Rolena Infante tomorrow and see if he can get an appointment with him for further evaluation.  Continue taking your prednisone as prescribed.  Please pick up the Percocet and take it as needed for the pain.  If the condition worsens, you are unable to urinate, develop a fever, or you are again unable to walk please follow-up again with the emergency department for further evaluation.

## 2020-10-01 NOTE — ED Notes (Signed)
Patient transported to MRI 

## 2020-10-01 NOTE — ED Notes (Signed)
Pt was able to ambulate with assistance. States that she is still in a lot of pain

## 2020-10-02 DIAGNOSIS — Z4889 Encounter for other specified surgical aftercare: Secondary | ICD-10-CM | POA: Diagnosis not present

## 2020-10-06 ENCOUNTER — Other Ambulatory Visit: Payer: Self-pay | Admitting: Orthopedic Surgery

## 2020-10-06 ENCOUNTER — Telehealth: Payer: Self-pay

## 2020-10-06 DIAGNOSIS — M545 Low back pain, unspecified: Secondary | ICD-10-CM

## 2020-10-06 NOTE — Telephone Encounter (Signed)
Called pt to screen her for a myelogram procedure that we received on order for today. Per the patient, she went to the ER on 10/01/20 and they did an MRI. Pt does not wish to proceed with the myelogram procedure at this time as she states "we have a game plan already". I did explain to the patient that an MRI and Myelogram study were different but patient still wished to not do the myelogram at this time. The patient has our direct contact info if she were to change her mind.

## 2020-10-09 DIAGNOSIS — M5459 Other low back pain: Secondary | ICD-10-CM | POA: Diagnosis not present

## 2020-10-21 DIAGNOSIS — M5459 Other low back pain: Secondary | ICD-10-CM | POA: Diagnosis not present

## 2020-10-28 DIAGNOSIS — E1169 Type 2 diabetes mellitus with other specified complication: Secondary | ICD-10-CM | POA: Diagnosis not present

## 2020-10-28 DIAGNOSIS — I10 Essential (primary) hypertension: Secondary | ICD-10-CM | POA: Diagnosis not present

## 2020-10-28 DIAGNOSIS — E785 Hyperlipidemia, unspecified: Secondary | ICD-10-CM | POA: Diagnosis not present

## 2020-10-28 DIAGNOSIS — G4730 Sleep apnea, unspecified: Secondary | ICD-10-CM | POA: Diagnosis not present

## 2020-10-28 DIAGNOSIS — K219 Gastro-esophageal reflux disease without esophagitis: Secondary | ICD-10-CM | POA: Diagnosis not present

## 2020-10-28 DIAGNOSIS — E559 Vitamin D deficiency, unspecified: Secondary | ICD-10-CM | POA: Diagnosis not present

## 2020-10-28 DIAGNOSIS — G43909 Migraine, unspecified, not intractable, without status migrainosus: Secondary | ICD-10-CM | POA: Diagnosis not present

## 2020-10-28 DIAGNOSIS — Z0000 Encounter for general adult medical examination without abnormal findings: Secondary | ICD-10-CM | POA: Diagnosis not present

## 2020-10-28 DIAGNOSIS — E538 Deficiency of other specified B group vitamins: Secondary | ICD-10-CM | POA: Diagnosis not present

## 2020-10-29 DIAGNOSIS — R4020 Unspecified coma: Secondary | ICD-10-CM | POA: Diagnosis not present

## 2020-10-29 DIAGNOSIS — R0689 Other abnormalities of breathing: Secondary | ICD-10-CM | POA: Diagnosis not present

## 2020-10-29 DIAGNOSIS — W01198A Fall on same level from slipping, tripping and stumbling with subsequent striking against other object, initial encounter: Secondary | ICD-10-CM | POA: Diagnosis not present

## 2020-10-29 DIAGNOSIS — I10 Essential (primary) hypertension: Secondary | ICD-10-CM | POA: Diagnosis not present

## 2020-10-29 DIAGNOSIS — S0990XA Unspecified injury of head, initial encounter: Secondary | ICD-10-CM | POA: Diagnosis not present

## 2020-10-29 DIAGNOSIS — S199XXA Unspecified injury of neck, initial encounter: Secondary | ICD-10-CM | POA: Diagnosis not present

## 2020-10-29 DIAGNOSIS — J9811 Atelectasis: Secondary | ICD-10-CM | POA: Diagnosis not present

## 2020-10-29 DIAGNOSIS — Z981 Arthrodesis status: Secondary | ICD-10-CM | POA: Diagnosis not present

## 2020-10-29 DIAGNOSIS — W19XXXA Unspecified fall, initial encounter: Secondary | ICD-10-CM | POA: Diagnosis not present

## 2020-10-29 DIAGNOSIS — R52 Pain, unspecified: Secondary | ICD-10-CM | POA: Diagnosis not present

## 2020-10-29 DIAGNOSIS — M4802 Spinal stenosis, cervical region: Secondary | ICD-10-CM | POA: Diagnosis not present

## 2020-10-29 DIAGNOSIS — R42 Dizziness and giddiness: Secondary | ICD-10-CM | POA: Diagnosis not present

## 2020-10-29 DIAGNOSIS — R55 Syncope and collapse: Secondary | ICD-10-CM | POA: Diagnosis not present

## 2020-10-29 DIAGNOSIS — M542 Cervicalgia: Secondary | ICD-10-CM | POA: Diagnosis not present

## 2020-11-05 ENCOUNTER — Other Ambulatory Visit: Payer: Self-pay | Admitting: Family Medicine

## 2020-11-05 DIAGNOSIS — E2839 Other primary ovarian failure: Secondary | ICD-10-CM

## 2020-11-05 DIAGNOSIS — E1169 Type 2 diabetes mellitus with other specified complication: Secondary | ICD-10-CM | POA: Diagnosis not present

## 2020-11-05 DIAGNOSIS — E538 Deficiency of other specified B group vitamins: Secondary | ICD-10-CM | POA: Diagnosis not present

## 2020-11-18 DIAGNOSIS — H26491 Other secondary cataract, right eye: Secondary | ICD-10-CM | POA: Diagnosis not present

## 2020-11-18 DIAGNOSIS — E119 Type 2 diabetes mellitus without complications: Secondary | ICD-10-CM | POA: Diagnosis not present

## 2020-11-18 DIAGNOSIS — Z961 Presence of intraocular lens: Secondary | ICD-10-CM | POA: Diagnosis not present

## 2020-11-18 DIAGNOSIS — H04123 Dry eye syndrome of bilateral lacrimal glands: Secondary | ICD-10-CM | POA: Diagnosis not present

## 2020-12-24 DIAGNOSIS — H26491 Other secondary cataract, right eye: Secondary | ICD-10-CM | POA: Diagnosis not present

## 2020-12-29 ENCOUNTER — Other Ambulatory Visit: Payer: Self-pay | Admitting: Interventional Cardiology

## 2021-01-11 DIAGNOSIS — E785 Hyperlipidemia, unspecified: Secondary | ICD-10-CM | POA: Diagnosis not present

## 2021-01-11 DIAGNOSIS — E1169 Type 2 diabetes mellitus with other specified complication: Secondary | ICD-10-CM | POA: Diagnosis not present

## 2021-01-11 DIAGNOSIS — I10 Essential (primary) hypertension: Secondary | ICD-10-CM | POA: Diagnosis not present

## 2021-01-11 DIAGNOSIS — R5382 Chronic fatigue, unspecified: Secondary | ICD-10-CM | POA: Diagnosis not present

## 2021-02-01 DIAGNOSIS — E538 Deficiency of other specified B group vitamins: Secondary | ICD-10-CM | POA: Diagnosis not present

## 2021-02-01 DIAGNOSIS — E041 Nontoxic single thyroid nodule: Secondary | ICD-10-CM | POA: Diagnosis not present

## 2021-02-01 DIAGNOSIS — I10 Essential (primary) hypertension: Secondary | ICD-10-CM | POA: Diagnosis not present

## 2021-02-03 DIAGNOSIS — E119 Type 2 diabetes mellitus without complications: Secondary | ICD-10-CM | POA: Diagnosis not present

## 2021-02-03 DIAGNOSIS — Z6837 Body mass index (BMI) 37.0-37.9, adult: Secondary | ICD-10-CM | POA: Diagnosis not present

## 2021-02-03 DIAGNOSIS — E041 Nontoxic single thyroid nodule: Secondary | ICD-10-CM | POA: Diagnosis not present

## 2021-02-03 DIAGNOSIS — N183 Chronic kidney disease, stage 3 unspecified: Secondary | ICD-10-CM | POA: Diagnosis not present

## 2021-02-03 DIAGNOSIS — I10 Essential (primary) hypertension: Secondary | ICD-10-CM | POA: Diagnosis not present

## 2021-02-03 DIAGNOSIS — E782 Mixed hyperlipidemia: Secondary | ICD-10-CM | POA: Diagnosis not present

## 2021-02-15 ENCOUNTER — Telehealth: Payer: Self-pay | Admitting: Interventional Cardiology

## 2021-02-15 NOTE — Telephone Encounter (Signed)
Pt did not go to ER for evaluation because she does not want to go and sit in the ER.  Spoke with pt and scheduled her to come in Friday to see Cecilie Kicks, NP.  States symptoms are not nearly as bad as they were last Tuesday and Wednesday.  No CP currently.  Advised if pain worsens she really does need to report to ED.  Pt verbalized understanding and was in agreement with plan.

## 2021-02-15 NOTE — Telephone Encounter (Signed)
Received call transferred directly from operator and spoke with patient. She reports recent changes in her reflux medication. She had severe chest pain two nights last week. Pain was behind her left breast and radiating to her left shoulder and arm.  She attributed pain to her change in reflux medications so she stopped taking reflux medication. Today pain has returned and is getting worse. She reports she is short of breath with talking. I advised patient to go to ED. She is agreeable with this plan

## 2021-02-15 NOTE — Telephone Encounter (Signed)
Patient is following up regarding symptoms discussed with RN. She would like to know if she can be worked in anywhere with Dr. Zigmund Daniel. Please advise.

## 2021-02-15 NOTE — Telephone Encounter (Signed)
Pt c/o of Chest Pain: STAT if CP now or developed within 24 hours  1. Are you having CP right now? yes  2. Are you experiencing any other symptoms (ex. SOB, nausea, vomiting, sweating)?  3. How long have you been experiencing CP? Since last week  4. Is your CP continuous or coming and going? continuous  5. Have you taken Nitroglycerin? no ?

## 2021-02-19 ENCOUNTER — Ambulatory Visit: Payer: Medicare PPO | Admitting: Cardiology

## 2021-02-19 ENCOUNTER — Encounter: Payer: Self-pay | Admitting: Cardiology

## 2021-02-19 ENCOUNTER — Other Ambulatory Visit: Payer: Self-pay

## 2021-02-19 VITALS — BP 118/68 | HR 76 | Ht 64.0 in | Wt 225.2 lb

## 2021-02-19 DIAGNOSIS — Z889 Allergy status to unspecified drugs, medicaments and biological substances status: Secondary | ICD-10-CM

## 2021-02-19 DIAGNOSIS — E782 Mixed hyperlipidemia: Secondary | ICD-10-CM | POA: Diagnosis not present

## 2021-02-19 DIAGNOSIS — I10 Essential (primary) hypertension: Secondary | ICD-10-CM | POA: Diagnosis not present

## 2021-02-19 DIAGNOSIS — I200 Unstable angina: Secondary | ICD-10-CM | POA: Diagnosis not present

## 2021-02-19 DIAGNOSIS — E118 Type 2 diabetes mellitus with unspecified complications: Secondary | ICD-10-CM | POA: Diagnosis not present

## 2021-02-19 DIAGNOSIS — G4733 Obstructive sleep apnea (adult) (pediatric): Secondary | ICD-10-CM | POA: Diagnosis not present

## 2021-02-19 DIAGNOSIS — Z9989 Dependence on other enabling machines and devices: Secondary | ICD-10-CM

## 2021-02-19 DIAGNOSIS — R079 Chest pain, unspecified: Secondary | ICD-10-CM | POA: Diagnosis not present

## 2021-02-19 MED ORDER — NITROGLYCERIN 0.4 MG SL SUBL: 0.4000 mg | SUBLINGUAL_TABLET | SUBLINGUAL | 2 refills | Status: DC | PRN

## 2021-02-19 MED ORDER — ISOSORBIDE MONONITRATE ER 30 MG PO TB24: 30.0000 mg | ORAL_TABLET | Freq: Every day | ORAL | 3 refills | Status: DC

## 2021-02-19 NOTE — H&P (View-Only) (Signed)
Cardiology Office Note   Date:  02/19/2021   ID:  Robyn Aguilar, DOB 03/14/1951, MRN [id number]  PCP:  Harlan Stains, MD  Cardiologist:  Dr. [name]     Chief Complaint  Patient presents with   Chest Pain      History of Present Illness: Robyn Aguilar is a 70 y.o. female who presents for chest pain.  hx of  left ventricular hypertrophy, hypertension, and hyperlipidemia She was found to have moderate hypertrophy on echo greater than 5 years ago.  She has some mild dyspnea on exertion.  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  She says she has had 2 mini strokes in the past.  Last seen by Dr. [name] 03/13/2019.  She had repeat echo with decreased muscle thickness and normal EF 60-65%.  No hypertrophic cardiomyopathy.  She was seen in lipid clinic and started on Nexlizet, stopped crestor. The patient also expressed her fear of needles and was not interested in starting a PCSK9 inhibitor for further reduction of LDL though she did go on to start repatha.    On 02/15/21 pt called with chest pain after having changes done with reflux meds.  She then had severe chest pain, 2 nights last week.  Lt chest with radiation for Lt shoulder and arm.  Recurrent pain on the 17th, she did not wish to go to ER. But she preferred to come to appt.   Cardiac cath in 2007 with normal coronary angiography done after abnormal stress test.  She did have a normal stress test 2014.      Last Monday or Tuesday she had significant chest pain most of night, with radiation to Lt arm.  No nausea or diaphoresis but she did feel winded.  She does wear her CPAP at night.   During the day was not so noticeable.  Then the next night not as severe.  Since that times she will have mild chest aching that comes and goes.  Not severe.  She originally thought it was reflux- though she has never had pain like that before.  Her aciphex had been stopped and pepcid  added because it may have been affecting her kidneys.her maxide was stopped as well.  Last GFR I have is 61.  And Cr 1.0   Past Medical History:  Diagnosis Date   Allergic rhinitis    Anxiety    Arthritis    knees and neck   Cataracts, bilateral    immature    CHF (congestive heart failure) ([place]) 2014   after surgery   Complication of anesthesia    had heart failure after knee surgery 02/04/13   DDD (degenerative disc disease), lumbar    Diabetes mellitus without complication (H[facility])    Dizziness    takes Antivert prn   GERD (gastroesophageal reflux disease)    takes Aciphex prn   High cholesterol    but doesn't take any meds   History of colon polyps    History of migraine    last one 6-71months ago;takes Topamax nightly   Hypertension    takes Metoprolol and maxzide daily   Joint pain    Joint swelling    Migraine    Migraines    Pneumonia    at age 58   Seizures ([place])    hx of;last one 43yrs ago;no meds required;states it was brought on by med;only one ever had   Shortness of breath 02/06/2013   Sleep apnea    sleep  study in epic from 2014-uses CPAP   Stroke ([place])    2 TIA   TIA (transient ischemic attack)     Past Surgical History:  Procedure Laterality Date   ABDOMINAL HYSTERECTOMY  1991   ANTERIOR LAT LUMBAR FUSION N/A 11/21/2019   Procedure: ANTERIOR LATERAL LUMBAR FUSION (XLIF) with lateral plate Lumbar two through lumbar three;  Surgeon: Melina Schools, MD;  Location: [facility];  Service: Orthopedics;  Laterality: N/A;  3 hrs   carpel tunnel Right 1988   COLONOSCOPY     disc surgery  1996   ESOPHAGOGASTRODUODENOSCOPY     EYE SURGERY Bilateral    cataract surgery with lens implants   HERNIA REPAIR  1987   x 2   KNEE SURGERY Left 2011   replacement-partial   LUMBAR LAMINECTOMY/DECOMPRESSION MICRODISCECTOMY Right 07/13/2017   Procedure: Disectomy Right L2-3;  Surgeon: Melina Schools, MD;  Location: [facility];  Service: Orthopedics;  Laterality: Right;  2.5 hrs    ROBOTIC ASSISTED BILATERAL SALPINGO OOPHERECTOMY Bilateral 10/22/2013   Procedure: ROBOTIC ASSISTED BILATERAL SALPINGO OOPHORECTOMY;  Surgeon: Imagene Gurney A. Alycia Rossetti, MD;  Location: [facility];  Service: Gynecology;  Laterality: Bilateral;   ROTATOR CUFF REPAIR Right    TOTAL KNEE ARTHROPLASTY Right    TOTAL KNEE ARTHROPLASTY Right 02/04/2013   Procedure: RIGHT TOTAL KNEE ARTHROPLASTY;  Surgeon: Vickey Huger, MD;  Location: [facility];  Service: Orthopedics;  Laterality: Right;     Current Outpatient Medications  Medication Sig Dispense Refill   Accu-Chek Softclix Lancets lancets USE TO TEST EVERY DAY     acetaminophen (TYLENOL) 650 MG CR tablet Take 1,300 mg by mouth every 8 (eight) hours as needed for pain.     aspirin EC 81 MG tablet Take 81 mg by mouth daily.     cetirizine (ZYRTEC) 10 MG tablet Take 10 mg by mouth daily.      cholecalciferol (VITAMIN D3) 25 MCG (1000 UNIT) tablet Take 1,000 Units by mouth daily.     cyclobenzaprine (FLEXERIL) 10 MG tablet Take 10 mg by mouth as needed.     ezetimibe (ZETIA) 10 MG tablet Take 1 tablet (10 mg total) by mouth daily. (Patient taking differently: Take 10 mg by mouth every other day.) 90 tablet 3   fluticasone (FLONASE) 50 MCG/ACT nasal spray Place 1-2 sprays into both nostrils at bedtime.     glucose blood (ACCU-CHEK AVIVA PLUS) test strip TEST EVERY DAY AS DIRECTED     isosorbide mononitrate (IMDUR) 30 MG 24 hr tablet Take 1 tablet (30 mg total) by mouth daily. 90 tablet 3   lidocaine (LIDODERM) 5 % lidocaine 5 % topical patch  UNWRAP AND APPLY 1 PATCH TO SKIN ONCE DAILY FOR 15 DAYS. REMOVE AND DISCARD PATCH WITHIN 12 HOURS OR AS DIRECTED BY DOCTOR.     LORazepam (ATIVAN) 0.5 MG tablet Take 0.25-0.5 mg by mouth daily as needed for anxiety.      meperidine (DEMEROL) 50 MG tablet Take 50 mg by mouth as needed.     metoprolol succinate (TOPROL-XL) 50 MG 24 hr tablet Take 50 mg by mouth daily. Take with or immediately following a meal.     nitroGLYCERIN  (NITROSTAT) 0.4 MG SL tablet Place 1 tablet (0.4 mg total) under the tongue every 5 (five) minutes as needed for chest pain. Take up to 3 tablets 25 tablet 2   RABEprazole (ACIPHEX) 20 MG tablet Take 20 mg by mouth at bedtime.     REPATHA SURECLICK 696 MG/ML SOAJ INJECT 1  PEN INTO THE SKIN EVERY 14 DAYS 2 mL 11   Semaglutide,0.25 or 0.5MG /DOS, (OZEMPIC, 0.25 OR 0.5 MG/DOSE,) 2 MG/1.5ML SOPN Ozempic 0.25 mg or 0.5 mg (2 mg/1.5 mL) subcutaneous pen injector  INJECT 0.25 MG UNDER THE SKIN WEEKLY     triamterene-hydrochlorothiazide (MAXZIDE) 75-50 MG tablet Take 0.5 tablets by mouth daily. Pt take one half tablet daily  1   Turmeric 500 MG CAPS Take 500 mg by mouth daily.     co-enzyme Q-10 30 MG capsule Take 30 mg by mouth daily. (Patient not taking: Reported on 02/19/2021)     oxyCODONE-acetaminophen (PERCOCET/ROXICET) 5-325 MG tablet Take 1 tablet by mouth every 6 (six) hours as needed for severe pain. (Patient not taking: Reported on 02/19/2021) 15 tablet 0   No current facility-administered medications for this visit.    Allergies:   Codeine, Statins, Ambien [zolpidem], Bempedoic acid, Budesonide, Crestor [rosuvastatin], Demerol [meperidine hcl], Edarbi [azilsartan], Fexofenadine, Olmesartan, Other, Sulfamethoxazole-trimethoprim, Valsartan-hydrochlorothiazide, Welchol [colesevelam], Adhesive [tape], and Latex    Social History:  The patient  reports that she has never smoked. She has never used smokeless tobacco. She reports that she does not drink alcohol and does not use drugs.   Family History:  The patient's family history includes Hypertension in her mother; Pancreatic cancer in her mother; Prostate cancer in an other family member.    ROS:  General:no colds or fevers, some weight gain Skin:no rashes or ulcers HEENT:no blurred vision, no congestion CV:see HPI PUL:see HPI GI:no diarrhea constipation or melena, no indigestion GU:no hematuria, no dysuria MS:no joint pain, no  claudication Neuro:no syncope, no lightheadedness Endo:+ diabetes, no thyroid disease  Wt Readings from Last 3 Encounters:  02/19/21 225 lb 3.2 oz (102.2 kg)  03/31/20 217 lb 12.8 oz (98.8 kg)  11/21/19 (!) 219 lb (99.3 kg)     PHYSICAL EXAM: VS:  BP 118/68   Pulse 76   Ht 5\' 4"  (1.626 m)   Wt 225 lb 3.2 oz (102.2 kg)   SpO2 96%   BMI 38.66 kg/m  , BMI Body mass index is 38.66 kg/m. General:Pleasant affect, NAD Skin:Warm and dry, brisk capillary refill HEENT:normocephalic, sclera clear, mucus membranes moist Neck:supple, no JVD, no bruits  Heart:S1S2 RRR without murmur, gallup, rub or click Lungs:clear without rales, rhonchi, or wheezes TAV:WPVX, non tender, + BS, do not palpate liver spleen or masses Ext:no lower ext edema, 2+ pedal pulses, 2+ radial pulses Neuro:alert and oriented X 3, MAE, follows commands, + facial symmetry    EKG:  EKG is ordered today. The ekg ordered today demonstrates SR with new RBBB. No acute ST changes   Recent Labs: 08/24/2020: ALT 35 10/01/2020: BUN 23; Creatinine, Ser 1.04; Hemoglobin 14.6; Platelets 293; Potassium 3.2; Sodium 139    Lipid Panel    Component Value Date/Time   CHOL 146 08/24/2020 0919   TRIG 109 08/24/2020 0919   HDL 54 08/24/2020 0919   CHOLHDL 2.7 08/24/2020 0919   CHOLHDL 10.6 02/07/2013 0650   VLDL 24 02/07/2013 0650   LDLCALC 72 08/24/2020 0919       Other studies Reviewed: Additional studies/ records that were reviewed today include: . ECHO 03/25/19 IMPRESSIONS     1. Left ventricular ejection fraction, by visual estimation, is 60 to  65%. The left ventricle has normal function. Left ventricular septal wall  thickness was mildly increased. Mildly increased left ventricular  posterior wall thickness. There is mildly  increased left ventricular hypertrophy.   2. No evidece of hypertrophic  cardiomyopathy.   3. Global right ventricle has normal systolic function.The right  ventricular size is normal. No  increase in right ventricular wall  thickness.   4. Left atrial size was normal.   5. Right atrial size was normal.   6. The mitral valve is normal in structure. Trace mitral valve  regurgitation. No evidence of mitral stenosis.   7. The tricuspid valve is normal in structure. Tricuspid valve  regurgitation is not demonstrated.   8. The aortic valve is tricuspid. Aortic valve regurgitation is not  visualized. No evidence of aortic valve sclerosis or stenosis.   9. The pulmonic valve was normal in structure. Pulmonic valve  regurgitation is mild.  10. The inferior vena cava is normal in size with greater than 50%  respiratory variability, suggesting right atrial pressure of 3 mmHg.   FINDINGS   Left Ventricle: Left ventricular ejection fraction, by visual estimation,  is 60 to 65%. The left ventricle has normal function. Mildly increased  left ventricular posterior wall thickness. There is mildly increased left  ventricular hypertrophy. Left  ventricular diastolic parameters were normal. Normal left atrial pressure.  No evidece of hypertrophic cardiomyopathy.   Right Ventricle: The right ventricular size is normal. No increase in  right ventricular wall thickness. Global RV systolic function is has  normal systolic function.   Left Atrium: Left atrial size was normal in size.   Right Atrium: Right atrial size was normal in size   Pericardium: There is no evidence of pericardial effusion.   Mitral Valve: The mitral valve is normal in structure. No evidence of  mitral valve stenosis by observation. Trace mitral valve regurgitation.   Tricuspid Valve: The tricuspid valve is normal in structure. Tricuspid  valve regurgitation is not demonstrated.   Aortic Valve: The aortic valve is tricuspid. Aortic valve regurgitation is  not visualized. The aortic valve is structurally normal, with no evidence  of sclerosis or stenosis.   Pulmonic Valve: The pulmonic valve was normal in  structure. Pulmonic valve  regurgitation is mild.   Aorta: The aortic root, ascending aorta and aortic arch are all  structurally normal, with no evidence of dilitation or obstruction.   Venous: The inferior vena cava is normal in size with greater than 50%  respiratory variability, suggesting right atrial pressure of 3 mmHg.   IAS/Shunts: No atrial level shunt detected by color flow Doppler. No  ventricular septal defect is seen or detected. There is no evidence of an  atrial septal defect.    ASSESSMENT AND PLAN:  1.  Unstable angina, with severe episode 1.5 weeks ago, has occ chest ache now and then may have mild SOB since that time.  At first she did not think so but the more she thought about it she does have some dyspnea.  New EKG changes. With RBBB.  We discussed cardiac cath and I discussed with DOD Dr. Rayann Heman.  - considered ER today but symptoms are better.  I have added imdur 30 mg daily, also gave NtG PRN.  We discussed if any increase of her pain to go to ER or call 911.  She was agreeable to this.  Plan for cardiac cath on Monday. Continue ASA and BB.  The patient understands that risks included but are not limited to stroke (1 in 1000), death (1 in 79), kidney failure [usually temporary] (1 in 500), bleeding (1 in 200), allergic reaction [possibly serious] (1 in 200).  All Questions answered.    2.  HLD  on zetia and repatha.   Last LDL 07/2020 72 and HDL 54.   3.  HTN controlled. Continue BB   4.  OSA on CPAP continue  5.  Hx of TIAs   6.  DM-2 per Endocrinologist and last A1C of 7   7.  Multiple allergies including latex and statin intolerances. .    Current medicines are reviewed with the patient today.  The patient Has no concerns regarding medicines.  The following changes have been made:  See above Labs/ tests ordered today include:see above  Disposition:   FU:  see above  Signed, Cecilie Kicks, NP  02/19/2021 4:20 PM    [facility]  [facility], [place], [place], [place] Phone: [phone]; Fax: [phone]

## 2021-02-19 NOTE — Patient Instructions (Addendum)
Medication Instructions:  Your physician has recommended you make the following change in your medication:  START: isosorbide mononitrate (Imdur) 30 mg by mouth once daily  START: nitroglycerin 0.4 mg under tongue as needed for Chest Pain If you have chest pain place 1 tablet under your tongue, wait 5 min If chest pain continues place a 2nd tablet under your tongue, wait 5 min If chest pain continues place a 3rd tablet under your tongue, wait 5 min If chest pain continues call 911    *If you need a refill on your cardiac medications before your next appointment, please call your pharmacy*   Lab Work: TODAY: CBC, BMET If you have labs (blood work) drawn today and your tests are completely normal, you will receive your results only by: MyChart Message (if you have MyChart) OR A paper copy in the mail If you have any lab test that is abnormal or we need to change your treatment, we will call you to review the results.   Testing/Procedures: Your physician has requested that you have a cardiac catheterization. Cardiac catheterization is used to diagnose and/or treat various heart conditions. Doctors may recommend this procedure for a number of different reasons. The most common reason is to evaluate chest pain. Chest pain can be a symptom of coronary artery disease (CAD), and cardiac catheterization can show whether plaque is narrowing or blocking your heart's arteries. This procedure is also used to evaluate the valves, as well as measure the blood flow and oxygen levels in different parts of your heart.  Please follow instruction sheet, as given.    Follow-Up: At [facility], you and your health needs are our priority.  As part of our continuing mission to provide you with exceptional heart care, we have created designated Provider Care Teams.  These Care Teams include your primary Cardiologist (physician) and Advanced Practice Providers (APPs -  Physician Assistants and Nurse  Practitioners) who all work together to provide you with the care you need, when you need it.  We recommend signing up for the patient portal called "MyChart".  Sign up information is provided on this After Visit Summary.  MyChart is used to connect with patients for Virtual Visits (Telemedicine).  Patients are able to view lab/test results, encounter notes, upcoming appointments, etc.  Non-urgent messages can be sent to your provider as well.   To learn more about what you can do with MyChart, go to [email or web address].    Your next appointment:   2 week(s) post cath 02/22/21  The format for your next appointment:   In Person  Provider:   You may see Sinclair Grooms, MD or one of the following Advanced Practice Providers on your designated Care Team:   Cecilie Kicks, NP   Other Instructions  Ekam Bonebrake  02/19/2021  You are scheduled for a Cardiac Catheterization on Monday, February 22, 2021 with Dr. Saunders Revel.  1. Please arrive at the [facility] (Main Entrance A) at [facility]: [place], [place] [address] at 5:30 AM (This time is two hours before your procedure to ensure your preparation). Free valet parking service is available.   Special note: Every effort is made to have your procedure done on time. Please understand that emergencies sometimes delay scheduled procedures.  2. Diet: Do not eat solid foods after midnight.  The patient may have clear liquids until 5am upon the day of the procedure.  3. Labs: You will need to have blood drawn on  TODAY  4. Medication instructions in preparation for your procedure:   Contrast Allergy: No    Do not take Triamterene-HCTZ (Maxzide) on Monday Oct. 24,2022    On the morning of your procedure, take your Aspirin 81 mg and any morning medicines NOT listed above.  You may use sips of water.  5. Plan for one night stay--bring personal belongings. 6. Bring a current list of your medications and current  insurance cards. 7. You MUST have a responsible person to drive you home. 8. Someone MUST be with you the first 24 hours after you arrive home or your discharge will be delayed. 9. Please wear clothes that are easy to get on and off and wear slip-on shoes.  Thank you for allowing [place] to care for you!   -- [facility]

## 2021-02-19 NOTE — Progress Notes (Signed)
Cardiology Office Note   Date:  02/19/2021   ID:  Robyn Aguilar, DOB November 13, 1950, MRN [id number]  PCP:  Harlan Stains, MD  Cardiologist:  Dr. [name]     Chief Complaint  Patient presents with   Chest Pain      History of Present Illness: Robyn Aguilar is a 70 y.o. female who presents for chest pain.  hx of  left ventricular hypertrophy, hypertension, and hyperlipidemia She was found to have moderate hypertrophy on echo greater than 5 years ago.  She has some mild dyspnea on exertion.  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  She says she has had 2 mini strokes in the past.  Last seen by Dr. [name] 03/13/2019.  She had repeat echo with decreased muscle thickness and normal EF 60-65%.  No hypertrophic cardiomyopathy.  She was seen in lipid clinic and started on Nexlizet, stopped crestor. The patient also expressed her fear of needles and was not interested in starting a PCSK9 inhibitor for further reduction of LDL though she did go on to start repatha.    On 02/15/21 pt called with chest pain after having changes done with reflux meds.  She then had severe chest pain, 2 nights last week.  Lt chest with radiation for Lt shoulder and arm.  Recurrent pain on the 17th, she did not wish to go to ER. But she preferred to come to appt.   Cardiac cath in 2007 with normal coronary angiography done after abnormal stress test.  She did have a normal stress test 2014.      Last Monday or Tuesday she had significant chest pain most of night, with radiation to Lt arm.  No nausea or diaphoresis but she did feel winded.  She does wear her CPAP at night.   During the day was not so noticeable.  Then the next night not as severe.  Since that times she will have mild chest aching that comes and goes.  Not severe.  She originally thought it was reflux- though she has never had pain like that before.  Her aciphex had been stopped and pepcid  added because it may have been affecting her kidneys.her maxide was stopped as well.  Last GFR I have is 61.  And Cr 1.0   Past Medical History:  Diagnosis Date   Allergic rhinitis    Anxiety    Arthritis    knees and neck   Cataracts, bilateral    immature    CHF (congestive heart failure) ([place]) 2014   after surgery   Complication of anesthesia    had heart failure after knee surgery 02/04/13   DDD (degenerative disc disease), lumbar    Diabetes mellitus without complication (HCC)    Dizziness    takes Antivert prn   GERD (gastroesophageal reflux disease)    takes Aciphex prn   High cholesterol    but doesn't take any meds   History of colon polyps    History of migraine    last one 6-11months ago;takes Topamax nightly   Hypertension    takes Metoprolol and maxzide daily   Joint pain    Joint swelling    Migraine    Migraines    Pneumonia    at age 89   Seizures ([place])    hx of;last one 33yrs ago;no meds required;states it was brought on by med;only one ever had   Shortness of breath 02/06/2013   Sleep apnea    sleep  study in epic from 2014-uses CPAP   Stroke ([place])    2 TIA   TIA (transient ischemic attack)     Past Surgical History:  Procedure Laterality Date   ABDOMINAL HYSTERECTOMY  1991   ANTERIOR LAT LUMBAR FUSION N/A 11/21/2019   Procedure: ANTERIOR LATERAL LUMBAR FUSION (XLIF) with lateral plate Lumbar two through lumbar three;  Surgeon: Melina Schools, MD;  Location: [facility];  Service: Orthopedics;  Laterality: N/A;  3 hrs   carpel tunnel Right 1988   COLONOSCOPY     disc surgery  1996   ESOPHAGOGASTRODUODENOSCOPY     EYE SURGERY Bilateral    cataract surgery with lens implants   HERNIA REPAIR  1987   x 2   KNEE SURGERY Left 2011   replacement-partial   LUMBAR LAMINECTOMY/DECOMPRESSION MICRODISCECTOMY Right 07/13/2017   Procedure: Disectomy Right L2-3;  Surgeon: Melina Schools, MD;  Location: [facility];  Service: Orthopedics;  Laterality: Right;  2.5 hrs    ROBOTIC ASSISTED BILATERAL SALPINGO OOPHERECTOMY Bilateral 10/22/2013   Procedure: ROBOTIC ASSISTED BILATERAL SALPINGO OOPHORECTOMY;  Surgeon: Imagene Gurney A. Alycia Rossetti, MD;  Location: [facility];  Service: Gynecology;  Laterality: Bilateral;   ROTATOR CUFF REPAIR Right    TOTAL KNEE ARTHROPLASTY Right    TOTAL KNEE ARTHROPLASTY Right 02/04/2013   Procedure: RIGHT TOTAL KNEE ARTHROPLASTY;  Surgeon: Vickey Huger, MD;  Location: [facility];  Service: Orthopedics;  Laterality: Right;     Current Outpatient Medications  Medication Sig Dispense Refill   Accu-Chek Softclix Lancets lancets USE TO TEST EVERY DAY     acetaminophen (TYLENOL) 650 MG CR tablet Take 1,300 mg by mouth every 8 (eight) hours as needed for pain.     aspirin EC 81 MG tablet Take 81 mg by mouth daily.     cetirizine (ZYRTEC) 10 MG tablet Take 10 mg by mouth daily.      cholecalciferol (VITAMIN D3) 25 MCG (1000 UNIT) tablet Take 1,000 Units by mouth daily.     cyclobenzaprine (FLEXERIL) 10 MG tablet Take 10 mg by mouth as needed.     ezetimibe (ZETIA) 10 MG tablet Take 1 tablet (10 mg total) by mouth daily. (Patient taking differently: Take 10 mg by mouth every other day.) 90 tablet 3   fluticasone (FLONASE) 50 MCG/ACT nasal spray Place 1-2 sprays into both nostrils at bedtime.     glucose blood (ACCU-CHEK AVIVA PLUS) test strip TEST EVERY DAY AS DIRECTED     isosorbide mononitrate (IMDUR) 30 MG 24 hr tablet Take 1 tablet (30 mg total) by mouth daily. 90 tablet 3   lidocaine (LIDODERM) 5 % lidocaine 5 % topical patch  UNWRAP AND APPLY 1 PATCH TO SKIN ONCE DAILY FOR 15 DAYS. REMOVE AND DISCARD PATCH WITHIN 12 HOURS OR AS DIRECTED BY DOCTOR.     LORazepam (ATIVAN) 0.5 MG tablet Take 0.25-0.5 mg by mouth daily as needed for anxiety.      meperidine (DEMEROL) 50 MG tablet Take 50 mg by mouth as needed.     metoprolol succinate (TOPROL-XL) 50 MG 24 hr tablet Take 50 mg by mouth daily. Take with or immediately following a meal.     nitroGLYCERIN  (NITROSTAT) 0.4 MG SL tablet Place 1 tablet (0.4 mg total) under the tongue every 5 (five) minutes as needed for chest pain. Take up to 3 tablets 25 tablet 2   RABEprazole (ACIPHEX) 20 MG tablet Take 20 mg by mouth at bedtime.     REPATHA SURECLICK 578 MG/ML SOAJ INJECT 1  PEN INTO THE SKIN EVERY 14 DAYS 2 mL 11   Semaglutide,0.25 or 0.5MG /DOS, (OZEMPIC, 0.25 OR 0.5 MG/DOSE,) 2 MG/1.5ML SOPN Ozempic 0.25 mg or 0.5 mg (2 mg/1.5 mL) subcutaneous pen injector  INJECT 0.25 MG UNDER THE SKIN WEEKLY     triamterene-hydrochlorothiazide (MAXZIDE) 75-50 MG tablet Take 0.5 tablets by mouth daily. Pt take one half tablet daily  1   Turmeric 500 MG CAPS Take 500 mg by mouth daily.     co-enzyme Q-10 30 MG capsule Take 30 mg by mouth daily. (Patient not taking: Reported on 02/19/2021)     oxyCODONE-acetaminophen (PERCOCET/ROXICET) 5-325 MG tablet Take 1 tablet by mouth every 6 (six) hours as needed for severe pain. (Patient not taking: Reported on 02/19/2021) 15 tablet 0   No current facility-administered medications for this visit.    Allergies:   Codeine, Statins, Ambien [zolpidem], Bempedoic acid, Budesonide, Crestor [rosuvastatin], Demerol [meperidine hcl], Edarbi [azilsartan], Fexofenadine, Olmesartan, Other, Sulfamethoxazole-trimethoprim, Valsartan-hydrochlorothiazide, Welchol [colesevelam], Adhesive [tape], and Latex    Social History:  The patient  reports that she has never smoked. She has never used smokeless tobacco. She reports that she does not drink alcohol and does not use drugs.   Family History:  The patient's family history includes Hypertension in her mother; Pancreatic cancer in her mother; Prostate cancer in an other family member.    ROS:  General:no colds or fevers, some weight gain Skin:no rashes or ulcers HEENT:no blurred vision, no congestion CV:see HPI PUL:see HPI GI:no diarrhea constipation or melena, no indigestion GU:no hematuria, no dysuria MS:no joint pain, no  claudication Neuro:no syncope, no lightheadedness Endo:+ diabetes, no thyroid disease  Wt Readings from Last 3 Encounters:  02/19/21 225 lb 3.2 oz (102.2 kg)  03/31/20 217 lb 12.8 oz (98.8 kg)  11/21/19 (!) 219 lb (99.3 kg)     PHYSICAL EXAM: VS:  BP 118/68   Pulse 76   Ht 5\' 4"  (1.626 m)   Wt 225 lb 3.2 oz (102.2 kg)   SpO2 96%   BMI 38.66 kg/m  , BMI Body mass index is 38.66 kg/m. General:Pleasant affect, NAD Skin:Warm and dry, brisk capillary refill HEENT:normocephalic, sclera clear, mucus membranes moist Neck:supple, no JVD, no bruits  Heart:S1S2 RRR without murmur, gallup, rub or click Lungs:clear without rales, rhonchi, or wheezes YWV:PXTG, non tender, + BS, do not palpate liver spleen or masses Ext:no lower ext edema, 2+ pedal pulses, 2+ radial pulses Neuro:alert and oriented X 3, MAE, follows commands, + facial symmetry    EKG:  EKG is ordered today. The ekg ordered today demonstrates SR with new RBBB. No acute ST changes   Recent Labs: 08/24/2020: ALT 35 10/01/2020: BUN 23; Creatinine, Ser 1.04; Hemoglobin 14.6; Platelets 293; Potassium 3.2; Sodium 139    Lipid Panel    Component Value Date/Time   CHOL 146 08/24/2020 0919   TRIG 109 08/24/2020 0919   HDL 54 08/24/2020 0919   CHOLHDL 2.7 08/24/2020 0919   CHOLHDL 10.6 02/07/2013 0650   VLDL 24 02/07/2013 0650   LDLCALC 72 08/24/2020 0919       Other studies Reviewed: Additional studies/ records that were reviewed today include: . ECHO 03/25/19 IMPRESSIONS     1. Left ventricular ejection fraction, by visual estimation, is 60 to  65%. The left ventricle has normal function. Left ventricular septal wall  thickness was mildly increased. Mildly increased left ventricular  posterior wall thickness. There is mildly  increased left ventricular hypertrophy.   2. No evidece of hypertrophic  cardiomyopathy.   3. Global right ventricle has normal systolic function.The right  ventricular size is normal. No  increase in right ventricular wall  thickness.   4. Left atrial size was normal.   5. Right atrial size was normal.   6. The mitral valve is normal in structure. Trace mitral valve  regurgitation. No evidence of mitral stenosis.   7. The tricuspid valve is normal in structure. Tricuspid valve  regurgitation is not demonstrated.   8. The aortic valve is tricuspid. Aortic valve regurgitation is not  visualized. No evidence of aortic valve sclerosis or stenosis.   9. The pulmonic valve was normal in structure. Pulmonic valve  regurgitation is mild.  10. The inferior vena cava is normal in size with greater than 50%  respiratory variability, suggesting right atrial pressure of 3 mmHg.   FINDINGS   Left Ventricle: Left ventricular ejection fraction, by visual estimation,  is 60 to 65%. The left ventricle has normal function. Mildly increased  left ventricular posterior wall thickness. There is mildly increased left  ventricular hypertrophy. Left  ventricular diastolic parameters were normal. Normal left atrial pressure.  No evidece of hypertrophic cardiomyopathy.   Right Ventricle: The right ventricular size is normal. No increase in  right ventricular wall thickness. Global RV systolic function is has  normal systolic function.   Left Atrium: Left atrial size was normal in size.   Right Atrium: Right atrial size was normal in size   Pericardium: There is no evidence of pericardial effusion.   Mitral Valve: The mitral valve is normal in structure. No evidence of  mitral valve stenosis by observation. Trace mitral valve regurgitation.   Tricuspid Valve: The tricuspid valve is normal in structure. Tricuspid  valve regurgitation is not demonstrated.   Aortic Valve: The aortic valve is tricuspid. Aortic valve regurgitation is  not visualized. The aortic valve is structurally normal, with no evidence  of sclerosis or stenosis.   Pulmonic Valve: The pulmonic valve was normal in  structure. Pulmonic valve  regurgitation is mild.   Aorta: The aortic root, ascending aorta and aortic arch are all  structurally normal, with no evidence of dilitation or obstruction.   Venous: The inferior vena cava is normal in size with greater than 50%  respiratory variability, suggesting right atrial pressure of 3 mmHg.   IAS/Shunts: No atrial level shunt detected by color flow Doppler. No  ventricular septal defect is seen or detected. There is no evidence of an  atrial septal defect.    ASSESSMENT AND PLAN:  1.  Unstable angina, with severe episode 1.5 weeks ago, has occ chest ache now and then may have mild SOB since that time.  At first she did not think so but the more she thought about it she does have some dyspnea.  New EKG changes. With RBBB.  We discussed cardiac cath and I discussed with DOD Dr. Rayann Heman.  - considered ER today but symptoms are better.  I have added imdur 30 mg daily, also gave NtG PRN.  We discussed if any increase of her pain to go to ER or call 911.  She was agreeable to this.  Plan for cardiac cath on Monday. Continue ASA and BB.  The patient understands that risks included but are not limited to stroke (1 in 1000), death (1 in 66), kidney failure [usually temporary] (1 in 500), bleeding (1 in 200), allergic reaction [possibly serious] (1 in 200).  All Questions answered.    2.  HLD  on zetia and repatha.   Last LDL 07/2020 72 and HDL 54.   3.  HTN controlled. Continue BB   4.  OSA on CPAP continue  5.  Hx of TIAs   6.  DM-2 per Endocrinologist and last A1C of 7   7.  Multiple allergies including latex and statin intolerances. .    Current medicines are reviewed with the patient today.  The patient Has no concerns regarding medicines.  The following changes have been made:  See above Labs/ tests ordered today include:see above  Disposition:   FU:  see above  Signed, Cecilie Kicks, NP  02/19/2021 4:20 PM    [facility]  [facility], [place], [place], [place] Phone: [phone]; Fax: [phone]

## 2021-02-20 LAB — CBC
Hematocrit: 43.3 % (ref 34.0–46.6)
Hemoglobin: 14.3 g/dL (ref 11.1–15.9)
MCH: 27.4 pg (ref 26.6–33.0)
MCHC: 33 g/dL (ref 31.5–35.7)
MCV: 83 fL (ref 79–97)
Platelets: 293 10*3/uL (ref 150–450)
RBC: 5.21 x10E6/uL (ref 3.77–5.28)
RDW: 14.9 % (ref 11.7–15.4)
WBC: 4.6 10*3/uL (ref 3.4–10.8)

## 2021-02-20 LAB — BASIC METABOLIC PANEL
BUN/Creatinine Ratio: 13 (ref 12–28)
BUN: 16 mg/dL (ref 8–27)
CO2: 28 mmol/L (ref 20–29)
Calcium: 10.4 mg/dL — ABNORMAL HIGH (ref 8.7–10.3)
Chloride: 100 mmol/L (ref 96–106)
Creatinine, Ser: 1.28 mg/dL — ABNORMAL HIGH (ref 0.57–1.00)
Glucose: 105 mg/dL — ABNORMAL HIGH (ref 70–99)
Potassium: 4.5 mmol/L (ref 3.5–5.2)
Sodium: 143 mmol/L (ref 134–144)
eGFR: 45 mL/min/{1.73_m2} — ABNORMAL LOW (ref >59)

## 2021-02-22 ENCOUNTER — Encounter (HOSPITAL_COMMUNITY)
Admission: RE | Disposition: A | Discharge status: Home / Self Care | Payer: Self-pay | Source: Home / Self Care | Attending: Internal Medicine

## 2021-02-22 ENCOUNTER — Ambulatory Visit (HOSPITAL_COMMUNITY)
Admission: RE | Admit: 2021-02-22 | Discharge: 2021-02-22 | Disposition: A | Discharge status: Home / Self Care | Payer: Medicare PPO | Attending: Internal Medicine | Admitting: Internal Medicine

## 2021-02-22 ENCOUNTER — Other Ambulatory Visit: Payer: Self-pay

## 2021-02-22 ENCOUNTER — Encounter (HOSPITAL_COMMUNITY): Payer: Self-pay | Admitting: Internal Medicine

## 2021-02-22 DIAGNOSIS — Z885 Allergy status to narcotic agent status: Secondary | ICD-10-CM | POA: Diagnosis not present

## 2021-02-22 DIAGNOSIS — I200 Unstable angina: Secondary | ICD-10-CM | POA: Diagnosis present

## 2021-02-22 DIAGNOSIS — I25110 Atherosclerotic heart disease of native coronary artery with unstable angina pectoris: Secondary | ICD-10-CM | POA: Diagnosis not present

## 2021-02-22 DIAGNOSIS — Z79899 Other long term (current) drug therapy: Secondary | ICD-10-CM | POA: Insufficient documentation

## 2021-02-22 DIAGNOSIS — Z7982 Long term (current) use of aspirin: Secondary | ICD-10-CM | POA: Diagnosis not present

## 2021-02-22 DIAGNOSIS — Z91040 Latex allergy status: Secondary | ICD-10-CM | POA: Diagnosis not present

## 2021-02-22 DIAGNOSIS — G4733 Obstructive sleep apnea (adult) (pediatric): Secondary | ICD-10-CM | POA: Insufficient documentation

## 2021-02-22 DIAGNOSIS — Z888 Allergy status to other drugs, medicaments and biological substances status: Secondary | ICD-10-CM | POA: Insufficient documentation

## 2021-02-22 DIAGNOSIS — Z881 Allergy status to other antibiotic agents status: Secondary | ICD-10-CM | POA: Diagnosis not present

## 2021-02-22 DIAGNOSIS — Z8249 Family history of ischemic heart disease and other diseases of the circulatory system: Secondary | ICD-10-CM | POA: Diagnosis not present

## 2021-02-22 DIAGNOSIS — Z8673 Personal history of transient ischemic attack (TIA), and cerebral infarction without residual deficits: Secondary | ICD-10-CM | POA: Diagnosis not present

## 2021-02-22 DIAGNOSIS — E785 Hyperlipidemia, unspecified: Secondary | ICD-10-CM | POA: Insufficient documentation

## 2021-02-22 DIAGNOSIS — E119 Type 2 diabetes mellitus without complications: Secondary | ICD-10-CM | POA: Diagnosis not present

## 2021-02-22 DIAGNOSIS — R0609 Other forms of dyspnea: Secondary | ICD-10-CM | POA: Diagnosis not present

## 2021-02-22 DIAGNOSIS — I10 Essential (primary) hypertension: Secondary | ICD-10-CM | POA: Diagnosis not present

## 2021-02-22 HISTORY — PX: LEFT HEART CATH AND CORONARY ANGIOGRAPHY: CATH118249

## 2021-02-22 LAB — GLUCOSE, CAPILLARY: Glucose-Capillary: 97 mg/dL (ref 70–99)

## 2021-02-22 SURGERY — LEFT HEART CATH AND CORONARY ANGIOGRAPHY
Anesthesia: LOCAL

## 2021-02-22 MED ORDER — HEPARIN (PORCINE) IN NACL 1000-0.9 UT/500ML-% IV SOLN
INTRAVENOUS | Status: DC | PRN
  Administered 2021-02-22 (×2): 500 mL

## 2021-02-22 MED ORDER — ACETAMINOPHEN 325 MG PO TABS
650.0000 mg | ORAL_TABLET | ORAL | Status: DC | PRN
Start: 2021-02-22 — End: 2021-02-22
  Administered 2021-02-22: 650 mg via ORAL
  Filled 2021-02-22: qty 2

## 2021-02-22 MED ORDER — LIDOCAINE HCL (PF) 1 % IJ SOLN
INTRAMUSCULAR | Status: DC | PRN
  Administered 2021-02-22: 2 mL

## 2021-02-22 MED ORDER — ONDANSETRON HCL 4 MG/2ML IJ SOLN: 4.0000 mg | Freq: Four times a day (QID) | INTRAMUSCULAR | Status: DC | PRN

## 2021-02-22 MED ORDER — HEPARIN (PORCINE) IN NACL 1000-0.9 UT/500ML-% IV SOLN
INTRAVENOUS | Status: AC
  Filled 2021-02-22: qty 1000

## 2021-02-22 MED ORDER — HYDRALAZINE HCL 20 MG/ML IJ SOLN: 10.0000 mg | INTRAMUSCULAR | Status: DC | PRN

## 2021-02-22 MED ORDER — SODIUM CHLORIDE 0.9% FLUSH: 3.0000 mL | INTRAVENOUS | Status: DC | PRN

## 2021-02-22 MED ORDER — VERAPAMIL HCL 2.5 MG/ML IV SOLN
INTRAVENOUS | Status: AC
  Filled 2021-02-22: qty 2

## 2021-02-22 MED ORDER — FENTANYL CITRATE (PF) 100 MCG/2ML IJ SOLN
INTRAMUSCULAR | Status: AC
  Filled 2021-02-22: qty 2

## 2021-02-22 MED ORDER — ASPIRIN 81 MG PO CHEW: 81.0000 mg | CHEWABLE_TABLET | ORAL | Status: DC

## 2021-02-22 MED ORDER — SODIUM CHLORIDE 0.9 % IV SOLN: 250.0000 mL | INTRAVENOUS | Status: DC | PRN

## 2021-02-22 MED ORDER — HEPARIN SODIUM (PORCINE) 1000 UNIT/ML IJ SOLN
INTRAMUSCULAR | Status: AC
  Filled 2021-02-22: qty 1

## 2021-02-22 MED ORDER — SODIUM CHLORIDE 0.9% FLUSH: 3.0000 mL | Freq: Two times a day (BID) | INTRAVENOUS | Status: DC

## 2021-02-22 MED ORDER — MIDAZOLAM HCL 2 MG/2ML IJ SOLN
INTRAMUSCULAR | Status: AC
  Filled 2021-02-22: qty 2

## 2021-02-22 MED ORDER — FENTANYL CITRATE (PF) 100 MCG/2ML IJ SOLN
INTRAMUSCULAR | Status: DC | PRN
  Administered 2021-02-22 (×2): 25 ug via INTRAVENOUS

## 2021-02-22 MED ORDER — MIDAZOLAM HCL 2 MG/2ML IJ SOLN
INTRAMUSCULAR | Status: DC | PRN
  Administered 2021-02-22 (×2): 1 mg via INTRAVENOUS

## 2021-02-22 MED ORDER — LABETALOL HCL 5 MG/ML IV SOLN: 10.0000 mg | INTRAVENOUS | Status: DC | PRN

## 2021-02-22 MED ORDER — SODIUM CHLORIDE 0.9 % WEIGHT BASED INFUSION
3.0000 mL/kg/h | INTRAVENOUS | Status: AC
Start: 2021-02-22 — End: 2021-02-22
  Administered 2021-02-22: 3 mL/kg/h via INTRAVENOUS

## 2021-02-22 MED ORDER — LIDOCAINE HCL (PF) 1 % IJ SOLN
INTRAMUSCULAR | Status: AC
  Filled 2021-02-22: qty 30

## 2021-02-22 MED ORDER — VERAPAMIL HCL 2.5 MG/ML IV SOLN
INTRAVENOUS | Status: DC | PRN
  Administered 2021-02-22: 10 mL via INTRA_ARTERIAL

## 2021-02-22 MED ORDER — IOHEXOL 350 MG/ML SOLN
INTRAVENOUS | Status: DC | PRN
  Administered 2021-02-22: 55 mL

## 2021-02-22 MED ORDER — HEPARIN SODIUM (PORCINE) 1000 UNIT/ML IJ SOLN
INTRAMUSCULAR | Status: DC | PRN
  Administered 2021-02-22: 5000 [IU] via INTRAVENOUS

## 2021-02-22 MED ORDER — SODIUM CHLORIDE 0.9 % WEIGHT BASED INFUSION: 1.0000 mL/kg/h | INTRAVENOUS | Status: DC

## 2021-02-22 SURGICAL SUPPLY — 10 items
CATH 5FR JL3.5 JR4 ANG PIG MP (CATHETERS) ×2 IMPLANT
DEVICE RAD COMP TR BAND LRG (VASCULAR PRODUCTS) ×2 IMPLANT
GLIDESHEATH SLEND SS 6F .021 (SHEATH) ×2 IMPLANT
GUIDEWIRE INQWIRE 1.5J.035X260 (WIRE) ×1 IMPLANT
INQWIRE 1.5J .035X260CM (WIRE) ×2
KIT HEART LEFT (KITS) ×2 IMPLANT
PACK CARDIAC CATHETERIZATION (CUSTOM PROCEDURE TRAY) ×2 IMPLANT
SHEATH PROBE COVER 6X72 (BAG) ×2 IMPLANT
TRANSDUCER W/STOPCOCK (MISCELLANEOUS) ×2 IMPLANT
TUBING CIL FLEX 10 FLL-RA (TUBING) ×2 IMPLANT

## 2021-02-22 NOTE — Interval H&P Note (Signed)
History and Physical Interval Note:  02/22/2021 7:20 AM  Robyn Aguilar  has presented today for surgery, with the diagnosis of unstable angina.  The various methods of treatment have been discussed with the patient and family. After consideration of risks, benefits and other options for treatment, the patient has consented to  Procedure(s): LEFT HEART CATH AND CORONARY ANGIOGRAPHY (N/A) as a surgical intervention.  The patient's history has been reviewed, patient examined, no change in status, stable for surgery.  I have reviewed the patient's chart and labs.  Questions were answered to the patient's satisfaction.    Cath Lab Visit (complete for each Cath Lab visit)  Clinical Evaluation Leading to the Procedure:   ACS: No.  Non-ACS:    Anginal Classification: CCS IV  Anti-ischemic medical therapy: Maximal Therapy (2 or more classes of medications)  Non-Invasive Test Results: No non-invasive testing performed  Prior CABG: No previous CABG   Robyn Aguilar

## 2021-02-23 DIAGNOSIS — Z1231 Encounter for screening mammogram for malignant neoplasm of breast: Secondary | ICD-10-CM | POA: Diagnosis not present

## 2021-02-24 ENCOUNTER — Telehealth: Payer: Self-pay | Admitting: Internal Medicine

## 2021-02-24 ENCOUNTER — Telehealth: Payer: Self-pay | Admitting: Neurology

## 2021-02-24 MED ORDER — EMPAGLIFLOZIN 10 MG PO TABS: 10.0000 mg | ORAL_TABLET | Freq: Every day | ORAL | 3 refills | Status: AC

## 2021-02-24 NOTE — Telephone Encounter (Signed)
Returned call to patient who states she has had a severe headache since leaving the hospital post cardiac cath on 10/24. She stopped her Imdur on 10/23 and was advised she did not have to restart per Dr. Saunders Revel after her cath. Reports she took 2 extra strength Tylenol with no improvement so she took Demerol. Advised she should avoid narcotics and NSAIDs and can take Tylenol 1000 mg TID. States she is drinking lots of water and urine is light yellow; she agrees to continue drinking at least 2 L daily. I reviewed Dr. Thompson Caul advice from cath report:   Let the patient know she has diastolic heart failure/dysfunction and may benefit from SGLT-2 such as Jardiance 10 mg dailt or Farxiga 10 mg daily. She should have f/u with me. BMET 7-10 days after SGLT2 if she agrees.A copy will be sent to Harlan Stains, MD  The patient had multiple questions about starting Jardiance in addition to her other medications. I reviewed her questions with Fuller [place], [place] who advised that patient's insurance prefers [place] over [place]. Advised that pt should continue to monitor [facility]SB[facility] and call back to report lows. Reviewed this information with patient and questions were answered to patient's satisfaction. She is scheduled to see Ermalinda Barrios, PA on 11/9 and we will get bmet at that time due to her distance from the office. I advised her to call back with additional questions or concerns and she thanked me for my assistance.

## 2021-02-24 NOTE — Telephone Encounter (Signed)
Pt called stating she has had a persistent headache since last Wednesday. Pt requesting a call back

## 2021-02-24 NOTE — Telephone Encounter (Signed)
Patient states she has been having sever headaches ever since 10/24 heart cath. She would like to know if this is normal and whether or not we have any recommendations. Please advise.

## 2021-02-24 NOTE — Telephone Encounter (Signed)
Called the patient back.  Patient states around Wednesday Thursday of last week she began to have a bad headache.  She states the pressure is on top of the head.  She took extra strength Tylenol.  Patient takes metoprolol as a prophylactic.  For acute management in the past she has a prescription for meperidine. She states that it calmed it down some. Pt states that she is staying hydrated and that she will take some more extra strength tylenol. I asked the patient if steroids are contraindicated and she states that she just received a dose pack for a issue related to her back. I advised her to go ahead and start that and see if that breaks the migraine cycle. Pt verbalized understanding.

## 2021-02-24 NOTE — Telephone Encounter (Signed)
Patient cathed by Dr. Saunders Revel on 02/22/21, but is a patient of Dr. Thompson Caul.  Routing to the [place] office.

## 2021-03-01 ENCOUNTER — Telehealth: Payer: Self-pay | Admitting: Pharmacist

## 2021-03-01 MED ORDER — REPATHA SURECLICK 140 MG/ML ~~LOC~~ SOAJ: SUBCUTANEOUS | 3 refills | Status: DC

## 2021-03-01 NOTE — Telephone Encounter (Signed)
Pt called clinic and left a voicemail reporting Robyn Aguilar is expensive at $80. Called pt back, she states this was for a 3 month supply which she did pick up. That is actually about the lowest possible copay for a Medicare plan for Jardiance. She also takes Ozempic and Repatha but is still in the [employer] for [place] so she does not pay its copay. Her grant expires on 12/13 of this year, I sent in a refill as a 3 month instead of 1 month supply so she can fill it once more before the grant expires, then hopefully funding will re-open in 2023 for re-enrollment. Pt wishes to see if we can lower Jardiance cost further for next year. It's already a Tier 2 on her formulary and cannot be lowered to a Tier 1 (generic meds only). Will mail her out the pt assistance form for Jardiance, unsure if she'll be approved since her insurance is already covering [place] pretty well. She will return this to the office once her portion is completed.

## 2021-03-03 NOTE — Progress Notes (Signed)
Cardiology Office Note    Date:  03/10/2021   ID:  Robyn Aguilar, DOB 07-Dec-1950, MRN [id number]   PCP:  Harlan Stains, MD   [place]  Cardiologist:  Sinclair Grooms, MD   Advanced Practice Provider:  No care team member to display Electrophysiologist:  None   [phone]   No chief complaint on file.   History of Present Illness:  Robyn Aguilar is a 70 y.o. female with history of hypertension with LVH, hyperlipidemia on Zetia and Repatha, OSA on CPAP, history of TIAs, DM2, multiple allergies including latex and statins.  Patient was admitted to the hospital with chest pain and new RBBB.  Cardiac cath showed nonobstructive CAD most significant stenosis 30 to 40% in the distal RCA normal LVEF 55 to 65% moderately elevated filling pressures LVEDP 25 mmHg.  Dr. [name] recommended Jardiance which she was started on.  Patient comes in for f/u. Has had some DOE with going up stairs but it has improved. No chest pain, edema. Many questions answered. Still having a lot of burning and reflux symptoms.   Past Medical History:  Diagnosis Date   Allergic rhinitis    Anxiety    Arthritis    knees and neck   Cataracts, bilateral    immature    CHF (congestive heart failure) ([place]) 2014   after surgery   Complication of anesthesia    had heart failure after knee surgery 02/04/13   DDD (degenerative disc disease), lumbar    Diabetes mellitus without complication (HCC)    Dizziness    takes Antivert prn   GERD (gastroesophageal reflux disease)    takes Aciphex prn   High cholesterol    but doesn't take any meds   History of colon polyps    History of migraine    last one 6-35months ago;takes Topamax nightly   Hypertension    takes Metoprolol and maxzide daily   Joint pain    Joint swelling    Migraine    Migraines    Pneumonia    at age 51   Seizures ([place])    hx of;last one 42yrs ago;no meds required;states it was brought on by  med;only one ever had   Shortness of breath 02/06/2013   Sleep apnea    sleep study in epic from 2014-uses CPAP   Stroke ([place])    2 TIA   TIA (transient ischemic attack)     Past Surgical History:  Procedure Laterality Date   ABDOMINAL HYSTERECTOMY  1991   ANTERIOR LAT LUMBAR FUSION N/A 11/21/2019   Procedure: ANTERIOR LATERAL LUMBAR FUSION (XLIF) with lateral plate Lumbar two through lumbar three;  Surgeon: Melina Schools, MD;  Location: [facility];  Service: Orthopedics;  Laterality: N/A;  3 hrs   carpel tunnel Right 1988   COLONOSCOPY     disc surgery  1996   ESOPHAGOGASTRODUODENOSCOPY     EYE SURGERY Bilateral    cataract surgery with lens implants   HERNIA REPAIR  1987   x 2   KNEE SURGERY Left 2011   replacement-partial   LEFT HEART CATH AND CORONARY ANGIOGRAPHY N/A 02/22/2021   Procedure: LEFT HEART CATH AND CORONARY ANGIOGRAPHY;  Surgeon: Nelva Bush, MD;  Location: [facility];  Service: Cardiovascular;  Laterality: N/A;   LUMBAR LAMINECTOMY/DECOMPRESSION MICRODISCECTOMY Right 07/13/2017   Procedure: Disectomy Right L2-3;  Surgeon: Melina Schools, MD;  Location: [facility];  Service: Orthopedics;  Laterality: Right;  2.5 hrs  ROBOTIC ASSISTED BILATERAL SALPINGO OOPHERECTOMY Bilateral 10/22/2013   Procedure: ROBOTIC ASSISTED BILATERAL SALPINGO OOPHORECTOMY;  Surgeon: Imagene Gurney A. Alycia Rossetti, MD;  Location: [facility];  Service: Gynecology;  Laterality: Bilateral;   ROTATOR CUFF REPAIR Right    TOTAL KNEE ARTHROPLASTY Right    TOTAL KNEE ARTHROPLASTY Right 02/04/2013   Procedure: RIGHT TOTAL KNEE ARTHROPLASTY;  Surgeon: Vickey Huger, MD;  Location: [facility];  Service: Orthopedics;  Laterality: Right;    Current Medications: Current Meds  Medication Sig   Accu-Chek Softclix Lancets lancets USE TO TEST EVERY DAY   acetaminophen (TYLENOL) 650 MG CR tablet Take 1,300 mg by mouth every 8 (eight) hours as needed for pain.   aspirin EC 81 MG tablet Take 81 mg by mouth daily.   cetirizine  (ZYRTEC) 10 MG tablet Take 10 mg by mouth daily.    cholecalciferol (VITAMIN D3) 25 MCG (1000 UNIT) tablet Take 1,000 Units by mouth daily.   cyclobenzaprine (FLEXERIL) 10 MG tablet Take 10 mg by mouth 3 (three) times daily as needed for muscle spasms.   empagliflozin (JARDIANCE) 10 MG TABS tablet Take 1 tablet (10 mg total) by mouth daily before breakfast.   Evolocumab (REPATHA SURECLICK) 161 MG/ML SOAJ INJECT 1 PEN INTO THE SKIN EVERY 14 DAYS   ezetimibe (ZETIA) 10 MG tablet Take 1 tablet (10 mg total) by mouth daily. (Patient taking differently: Take 10 mg by mouth every other day.)   fluticasone (FLONASE) 50 MCG/ACT nasal spray Place 1-2 sprays into both nostrils at bedtime.   glucose blood (ACCU-CHEK AVIVA PLUS) test strip TEST EVERY DAY AS DIRECTED   lidocaine (LIDODERM) 5 % Place 1 patch onto the skin daily as needed (pain).   LORazepam (ATIVAN) 0.5 MG tablet Take 0.25-0.5 mg by mouth daily as needed for anxiety.    MegaRed Omega-3 Krill Oil 500 MG CAPS Take 500 mg by mouth daily.   meperidine (DEMEROL) 50 MG tablet Take 50 mg by mouth every 6 (six) hours as needed for severe pain.   metoprolol succinate (TOPROL-XL) 50 MG 24 hr tablet Take 50 mg by mouth daily. Take with or immediately following a meal.   nitroGLYCERIN (NITROSTAT) 0.4 MG SL tablet Place 1 tablet (0.4 mg total) under the tongue every 5 (five) minutes as needed for chest pain. Take up to 3 tablets   RABEprazole (ACIPHEX) 20 MG tablet Take 20 mg by mouth every other day.   Semaglutide,0.25 or 0.5MG /DOS, (OZEMPIC, 0.25 OR 0.5 MG/DOSE,) 2 MG/1.5ML SOPN Inject 0.25 mg into the skin every Sunday.   triamterene-hydrochlorothiazide (MAXZIDE) 75-50 MG tablet Take 0.5 tablets by mouth daily. Pt take one half tablet daily     Allergies:   Codeine, Statins, Ambien [zolpidem], Bempedoic acid, Budesonide, Crestor [rosuvastatin], Demerol [meperidine hcl], Edarbi [azilsartan], Fexofenadine, Olmesartan, Sulfamethoxazole-trimethoprim,  Valsartan-hydrochlorothiazide, Welchol [colesevelam], Adhesive [tape], Latex, and Tapentadol   Social History   Socioeconomic History   Marital status: Married    Spouse name: Not on file   Number of children: 2   Years of education: college   Highest education level: Not on file  Occupational History    Employer: [employer]  Tobacco Use   Smoking status: Never   Smokeless tobacco: Never  Vaping Use   Vaping Use: Never used  Substance and Sexual Activity   Alcohol use: No    Alcohol/week: 0.0 standard drinks   Drug use: No   Sexual activity: Yes    Birth control/protection: Surgical  Other Topics Concern   Not on  file  Social History Narrative   ** Merged History Encounter **       Lives with husband Caffeine use:   Social Determinants of Radio broadcast assistant Strain: Not on file  Food Insecurity: Not on file  Transportation Needs: Not on file  Physical Activity: Not on file  Stress: Not on file  Social Connections: Not on file     Family History:  The patient's  family history includes Hypertension in her mother; Pancreatic cancer in her mother; Prostate cancer in an other family member.   ROS:   Please see the history of present illness.    ROS All other systems reviewed and are negative.   PHYSICAL EXAM:   VS:  BP 106/60   Pulse 99   Ht 5' 4.5" (1.638 m)   Wt 223 lb 3.2 oz (101.2 kg)   SpO2 97%   BMI 37.72 kg/m   Physical Exam  GEN: Obese, in no acute distress  Neck: no JVD, carotid bruits, or masses Cardiac:RRR; no murmurs, rubs, or gallops  Respiratory:  clear to auscultation bilaterally, normal work of breathing GI: soft, nontender, nondistended, + BS Ext: without cyanosis, clubbing, or edema, Good distal pulses bilaterally Neuro:  Alert and Oriented x 3, Psych: euthymic mood, full affect  Wt Readings from Last 3 Encounters:  03/10/21 223 lb 3.2 oz (101.2 kg)  02/22/21 225 lb (102.1 kg)  02/19/21 225 lb 3.2 oz (102.2 kg)       Studies/Labs Reviewed:   EKG:  EKG is not ordered today.     Recent Labs: 08/24/2020: ALT 35 02/19/2021: BUN 16; Creatinine, Ser 1.28; Hemoglobin 14.3; Platelets 293; Potassium 4.5; Sodium 143   Lipid Panel    Component Value Date/Time   CHOL 146 08/24/2020 0919   TRIG 109 08/24/2020 0919   HDL 54 08/24/2020 0919   CHOLHDL 2.7 08/24/2020 0919   CHOLHDL 10.6 02/07/2013 0650   VLDL 24 02/07/2013 0650   LDLCALC 72 08/24/2020 0919    Additional studies/ records that were reviewed today include:  Cardiac cath 02/22/21 Mildly ectatic coronary arteries with mild-moderate, non-obstructive coronary artery disease.  Most significant stenosis is 30-40% lesion in the distal RCA. Normal left ventricular contraction (LVEF 55-65%) with moderately elevated filling pressure (LVEDP ~25 mmHg).   Recommendations: Continue medical therapy and risk factor modification, as no critical stenosis is evident to explain recent chest pain. Medical therapy of diastolic dysfunction with moderately elevated left ventricular end-diastolic pressure.   Nelva Bush, MD [facility] ECHO 03/25/19 IMPRESSIONS     1. Left ventricular ejection fraction, by visual estimation, is 60 to  65%. The left ventricle has normal function. Left ventricular septal wall  thickness was mildly increased. Mildly increased left ventricular  posterior wall thickness. There is mildly  increased left ventricular hypertrophy.   2. No evidece of hypertrophic cardiomyopathy.   3. Global right ventricle has normal systolic function.The right  ventricular size is normal. No increase in right ventricular wall  thickness.   4. Left atrial size was normal.   5. Right atrial size was normal.   6. The mitral valve is normal in structure. Trace mitral valve  regurgitation. No evidence of mitral stenosis.   7. The tricuspid valve is normal in structure. Tricuspid valve  regurgitation is not demonstrated.   8. The aortic valve  is tricuspid. Aortic valve regurgitation is not  visualized. No evidence of aortic valve sclerosis or stenosis.   9. The pulmonic valve was normal  in structure. Pulmonic valve  regurgitation is mild.  10. The inferior vena cava is normal in size with greater than 50%  respiratory variability, suggesting right atrial pressure of 3 mmHg.   FINDINGS   Left Ventricle: Left ventricular ejection fraction, by visual estimation,  is 60 to 65%. The left ventricle has normal function. Mildly increased  left ventricular posterior wall thickness. There is mildly increased left  ventricular hypertrophy. Left  ventricular diastolic parameters were normal. Normal left atrial pressure.  No evidece of hypertrophic cardiomyopathy.   Right Ventricle: The right ventricular size is normal. No increase in  right ventricular wall thickness. Global RV systolic function is has  normal systolic function.   Left Atrium: Left atrial size was normal in size.   Right Atrium: Right atrial size was normal in size   Pericardium: There is no evidence of pericardial effusion.   Mitral Valve: The mitral valve is normal in structure. No evidence of  mitral valve stenosis by observation. Trace mitral valve regurgitation.   Tricuspid Valve: The tricuspid valve is normal in structure. Tricuspid  valve regurgitation is not demonstrated.   Aortic Valve: The aortic valve is tricuspid. Aortic valve regurgitation is  not visualized. The aortic valve is structurally normal, with no evidence  of sclerosis or stenosis.   Pulmonic Valve: The pulmonic valve was normal in structure. Pulmonic valve  regurgitation is mild.   Aorta: The aortic root, ascending aorta and aortic arch are all  structurally normal, with no evidence of dilitation or obstruction.   Venous: The inferior vena cava is normal in size with greater than 50%  respiratory variability, suggesting right atrial pressure of 3 mmHg.   IAS/Shunts: No atrial level  shunt detected by color flow Doppler. No  ventricular septal defect is seen or detected. There is no evidence of an  atrial septal defect.     Risk Assessment/Calculations:         ASSESSMENT:    1. Coronary artery disease involving native coronary artery of native heart without angina pectoris   2. Chronic diastolic CHF (congestive heart failure) ([place])   3. Essential hypertension   4. Other hyperlipidemia   5. History of TIA (transient ischemic attack)   6. Gastroesophageal reflux disease, unspecified whether esophagitis present      PLAN:  In order of problems listed above:  CAD nonobstructive normal LVEF 55 to 65% with moderately elevated filling pressures-no chest pain  Diastolic CHF started on Jardiance by Dr. Nira Conn has improved  Hypertension with LVH-controlled  Hyperlipidemia possibly heterozygous hypercholesterolemia on Repatha and zetia LDL 72 07/2020  History of TIAs  GERD/reflux-refer to GI  Shared Decision Making/Informed Consent        Medication Adjustments/Labs and Tests Ordered: Current medicines are reviewed at length with the patient today.  Concerns regarding medicines are outlined above.  Medication changes, Labs and Tests ordered today are listed in the Patient Instructions below. Patient Instructions  Medication Instructions:  Your physician recommends that you continue on your current medications as directed. Please refer to the Current Medication list given to you today.  *If you need a refill on your cardiac medications before your next appointment, please call your pharmacy*   Lab Work: None If you have labs (blood work) drawn today and your tests are completely normal, you will receive your results only by: [place] (if you have MyChart) OR A paper copy in the mail If you have any lab test that is abnormal or we need  to change your treatment, we will call you to review the results.   Follow-Up: At [facility], you and  your health needs are our priority.  As part of our continuing mission to provide you with exceptional heart care, we have created designated Provider Care Teams.  These Care Teams include your primary Cardiologist (physician) and Advanced Practice Providers (APPs -  Physician Assistants and Nurse Practitioners) who all work together to provide you with the care you need, when you need it.   Your next appointment:   4-5 month(s)  The format for your next appointment:   In Person  Provider:   Sinclair Grooms, MD {    Signed, Ermalinda Barrios, PA-C  03/10/2021 9:39 AM    [address], [place], [place]  [address] Phone: [phone]; Fax: [phone]

## 2021-03-05 ENCOUNTER — Telehealth: Payer: Self-pay | Admitting: Physician Assistant

## 2021-03-05 NOTE — Telephone Encounter (Signed)
Spoke with the pt and she will try to keep her appt. But will call if she has any difficulty.

## 2021-03-05 NOTE — Telephone Encounter (Signed)
Robyn Aguilar is calling about her appointment scheduled for 03/10/21. She is off 11/08 and 11/11 and is wanting to know if she can be worked in for either of those instead.

## 2021-03-10 ENCOUNTER — Ambulatory Visit: Payer: Medicare PPO | Admitting: Physician Assistant

## 2021-03-10 ENCOUNTER — Encounter: Payer: Self-pay | Admitting: Physician Assistant

## 2021-03-10 ENCOUNTER — Other Ambulatory Visit: Payer: Self-pay

## 2021-03-10 VITALS — BP 106/60 | HR 99 | Ht 64.5 in | Wt 223.2 lb

## 2021-03-10 DIAGNOSIS — Z8673 Personal history of transient ischemic attack (TIA), and cerebral infarction without residual deficits: Secondary | ICD-10-CM

## 2021-03-10 DIAGNOSIS — K219 Gastro-esophageal reflux disease without esophagitis: Secondary | ICD-10-CM | POA: Diagnosis not present

## 2021-03-10 DIAGNOSIS — I5032 Chronic diastolic (congestive) heart failure: Secondary | ICD-10-CM

## 2021-03-10 DIAGNOSIS — I10 Essential (primary) hypertension: Secondary | ICD-10-CM | POA: Diagnosis not present

## 2021-03-10 DIAGNOSIS — E7849 Other hyperlipidemia: Secondary | ICD-10-CM

## 2021-03-10 DIAGNOSIS — I2510 Atherosclerotic heart disease of native coronary artery without angina pectoris: Secondary | ICD-10-CM

## 2021-03-10 NOTE — Patient Instructions (Addendum)
Medication Instructions:  Your physician recommends that you continue on your current medications as directed. Please refer to the Current Medication list given to you today.  *If you need a refill on your cardiac medications before your next appointment, please call your pharmacy*   Lab Work: None If you have labs (blood work) drawn today and your tests are completely normal, you will receive your results only by: [place] (if you have MyChart) OR A paper copy in the mail If you have any lab test that is abnormal or we need to change your treatment, we will call you to review the results.   Follow-Up: At [facility], you and your health needs are our priority.  As part of our continuing mission to provide you with exceptional heart care, we have created designated Provider Care Teams.  These Care Teams include your primary Cardiologist (physician) and Advanced Practice Providers (APPs -  Physician Assistants and Nurse Practitioners) who all work together to provide you with the care you need, when you need it.   Your next appointment:   06/16/2021 at 4:00 PM  The format for your next appointment:   In Person  Provider:   Sinclair Grooms, MD {

## 2021-03-18 ENCOUNTER — Telehealth: Payer: Self-pay | Admitting: Physician Assistant

## 2021-03-18 ENCOUNTER — Encounter: Payer: Self-pay | Admitting: Gastroenterology

## 2021-03-18 NOTE — Telephone Encounter (Signed)
New Message:     Patient said when she saw Robyn Aguilar last week she said she was going to refer her to a particular GI doctor at [facility]. She would like the name of that doctor please.

## 2021-03-19 NOTE — Telephone Encounter (Signed)
Pt advised:   Robyn Aguilar at [facility], or Resa Miner at [facility]

## 2021-03-30 DIAGNOSIS — Z01419 Encounter for gynecological examination (general) (routine) without abnormal findings: Secondary | ICD-10-CM | POA: Diagnosis not present

## 2021-03-31 ENCOUNTER — Encounter: Payer: Self-pay | Admitting: Adult Health

## 2021-03-31 ENCOUNTER — Ambulatory Visit: Payer: Medicare PPO | Admitting: Adult Health

## 2021-03-31 VITALS — BP 115/79 | HR 91 | Ht 64.0 in | Wt 223.4 lb

## 2021-03-31 DIAGNOSIS — Z9989 Dependence on other enabling machines and devices: Secondary | ICD-10-CM | POA: Diagnosis not present

## 2021-03-31 DIAGNOSIS — R251 Tremor, unspecified: Secondary | ICD-10-CM

## 2021-03-31 DIAGNOSIS — G4733 Obstructive sleep apnea (adult) (pediatric): Secondary | ICD-10-CM | POA: Diagnosis not present

## 2021-03-31 NOTE — Patient Instructions (Addendum)
Continue using CPAP nightly and greater than 4 hours each night Monitor tremor If your symptoms worsen or you develop new symptoms please let us know.

## 2021-03-31 NOTE — Progress Notes (Signed)
PATIENT: Robyn Aguilar DOB: Sep 07, 1950  REASON FOR VISIT: follow up HISTORY FROM: patient  HISTORY OF PRESENT ILLNESS: Today 03/31/21:  Robyn Aguilar is a 70 year old female with a history of obstructive sleep apnea on CPAP.  She returns today for follow-up.  Her download is attached to this note.  Reports that CPAP is working well for her.  She denies any issues.  Patient does note that she has a tremor in the upper extremities right greater than left.  It tends to be more pronounced in the morning but gets better as the day goes on.  She notices it with her handwriting.  When she is at rest she does not pay much attention to the tremor.  She reports that her grandfather did have a diagnosis of Parkinson's disease.  She returns today for an evaluation.  03/31/20: Robyn Aguilar is a 70 year old female with a history of obstructive sleep apnea on CPAP.  Her download indicates that she use her machine nightly for compliance of 100%.  She use her machine greater than 4 hours each night.  On average she uses her machine 8 hours and 33 minutes.  Her residual AHI is 0.9 on 5 to 15 cm of water with EPR 3.  Leak in the 95th percentile is 6.2 L/min.  Reports that the CPAP is working well for her.  She returns today for follow-up.  HISTORY 03/27/19:   Robyn Aguilar is a 70 year old female with a history of obstructive sleep apnea on CPAP.  She returns today for follow-up.  Her download indicates that she use her machine nightly for compliance of 100%.  She use her machine greater than 4 hours 29 days for compliance of 97%.  On average she uses her machine 7 hours and 52 minutes.  Her residual AHI is 0.7 on 5 to 15 cm of water with EPR of 3.  Her leak in the 95th percentile is 1.7 L/min.  She reports that the CPAP is working well for her.  She states that she also has migraine headaches.  Her PCP gives her Demerol to treat her headaches.  She reports that she has been on  Topamax in the past but was unable to tolerate it.  She states that she has 2-3 migraines a week.  They typically occur in the left frontal region.  She does have photophobia and phonophobia as well as nausea.  Her headaches are typically triggered by weather changes.  She states that this past Saturday and Sunday she had a different type of headache.  It was in the left occipital region and she describes it as a sharp stabbing pain that will last for 20 seconds and then she would have a break and then it may do it again.  With Demerol her symptoms finally resolved.  She returns today for an evaluation.  REVIEW OF SYSTEMS: Out of a complete 14 system review of symptoms, the patient complains only of the following symptoms, and all other reviewed systems are negative.  [id number]  ALLERGIES: Allergies  Allergen Reactions   Codeine Hives   Statins Other (See Comments)    Muscle pain   Ambien [Zolpidem] Other (See Comments)    headache   Bempedoic Acid     Increase in uric acid   Budesonide     Other reaction(s): nose bleeds, headaches   Crestor [Rosuvastatin]     Other reaction(s): cramps, nausea   Demerol [Meperidine Hcl]     Other reaction(s): oversedation  Edarbi [Azilsartan]     Other reaction(s): migraines   Fexofenadine     Other reaction(s): headache   Olmesartan     Other reaction(s): not effective   Sulfamethoxazole-Trimethoprim     Other reaction(s): dizziness   Valsartan-Hydrochlorothiazide     Other reaction(s): not effective   Welchol [Colesevelam]     abdominal pain   Adhesive [Tape] Rash   Latex Rash   Tapentadol Rash    HOME MEDICATIONS: Outpatient Medications Prior to Visit  Medication Sig Dispense Refill   Accu-Chek Softclix Lancets lancets USE TO TEST EVERY DAY     acetaminophen (TYLENOL) 650 MG CR tablet Take 1,300 mg by mouth every 8 (eight) hours as needed for pain.     aspirin EC 81 MG tablet Take 81 mg by mouth daily.     cetirizine (ZYRTEC) 10 MG  tablet Take 10 mg by mouth daily.      cholecalciferol (VITAMIN D3) 25 MCG (1000 UNIT) tablet Take 1,000 Units by mouth daily.     cyclobenzaprine (FLEXERIL) 10 MG tablet Take 10 mg by mouth 3 (three) times daily as needed for muscle spasms.     empagliflozin (JARDIANCE) 10 MG TABS tablet Take 1 tablet (10 mg total) by mouth daily before breakfast. 90 tablet 3   Evolocumab (REPATHA SURECLICK) 160 MG/ML SOAJ INJECT 1 PEN INTO THE SKIN EVERY 14 DAYS 6 mL 3   ezetimibe (ZETIA) 10 MG tablet Take 1 tablet (10 mg total) by mouth daily. (Patient taking differently: Take 10 mg by mouth every other day.) 90 tablet 3   fluticasone (FLONASE) 50 MCG/ACT nasal spray Place 1-2 sprays into both nostrils at bedtime.     glucose blood (ACCU-CHEK AVIVA PLUS) test strip TEST EVERY DAY AS DIRECTED     lidocaine (LIDODERM) 5 % Place 1 patch onto the skin daily as needed (pain).     LORazepam (ATIVAN) 0.5 MG tablet Take 0.25-0.5 mg by mouth daily as needed for anxiety.      MegaRed Omega-3 Krill Oil 500 MG CAPS Take 500 mg by mouth daily.     meperidine (DEMEROL) 50 MG tablet Take 50 mg by mouth every 6 (six) hours as needed for severe pain.     metoprolol succinate (TOPROL-XL) 50 MG 24 hr tablet Take 50 mg by mouth daily. Take with or immediately following a meal.     nitroGLYCERIN (NITROSTAT) 0.4 MG SL tablet Place 1 tablet (0.4 mg total) under the tongue every 5 (five) minutes as needed for chest pain. Take up to 3 tablets 25 tablet 2   oxyCODONE-acetaminophen (PERCOCET/ROXICET) 5-325 MG tablet Take 1 tablet by mouth every 6 (six) hours as needed for severe pain. 15 tablet 0   RABEprazole (ACIPHEX) 20 MG tablet Take 20 mg by mouth every other day.     Semaglutide,0.25 or 0.5MG /DOS, (OZEMPIC, 0.25 OR 0.5 MG/DOSE,) 2 MG/1.5ML SOPN Inject 0.25 mg into the skin every Sunday.     triamterene-hydrochlorothiazide (MAXZIDE) 75-50 MG tablet Take 0.5 tablets by mouth daily. Pt take one half tablet daily  1   No  facility-administered medications prior to visit.    PAST MEDICAL HISTORY: Past Medical History:  Diagnosis Date   Allergic rhinitis    Anxiety    Arthritis    knees and neck   Cataracts, bilateral    immature    CHF (congestive heart failure) ([place]) 2014   after surgery   Complication of anesthesia    had heart failure after knee surgery  02/04/13   DDD (degenerative disc disease), lumbar    Diabetes mellitus without complication (HCC)    Dizziness    takes Antivert prn   GERD (gastroesophageal reflux disease)    takes Aciphex prn   High cholesterol    but doesn't take any meds   History of colon polyps    History of migraine    last one 6-54months ago;takes Topamax nightly   Hypertension    takes Metoprolol and maxzide daily   Joint pain    Joint swelling    Migraine    Migraines    Pneumonia    at age 84   Seizures ([place])    hx of;last one 35yrs ago;no meds required;states it was brought on by med;only one ever had   Shortness of breath 02/06/2013   Sleep apnea    sleep study in epic from 2014-uses CPAP   Stroke ([place])    2 TIA   TIA (transient ischemic attack)     PAST SURGICAL HISTORY: Past Surgical History:  Procedure Laterality Date   ABDOMINAL HYSTERECTOMY  1991   ANTERIOR LAT LUMBAR FUSION N/A 11/21/2019   Procedure: ANTERIOR LATERAL LUMBAR FUSION (XLIF) with lateral plate Lumbar two through lumbar three;  Surgeon: Melina Schools, MD;  Location: [facility];  Service: Orthopedics;  Laterality: N/A;  3 hrs   carpel tunnel Right 1988   COLONOSCOPY     disc surgery  1996   ESOPHAGOGASTRODUODENOSCOPY     EYE SURGERY Bilateral    cataract surgery with lens implants   HERNIA REPAIR  1987   x 2   KNEE SURGERY Left 2011   replacement-partial   LEFT HEART CATH AND CORONARY ANGIOGRAPHY N/A 02/22/2021   Procedure: LEFT HEART CATH AND CORONARY ANGIOGRAPHY;  Surgeon: Nelva Bush, MD;  Location: [facility];  Service: Cardiovascular;  Laterality: N/A;   LUMBAR  LAMINECTOMY/DECOMPRESSION MICRODISCECTOMY Right 07/13/2017   Procedure: Disectomy Right L2-3;  Surgeon: Melina Schools, MD;  Location: [facility];  Service: Orthopedics;  Laterality: Right;  2.5 hrs   ROBOTIC ASSISTED BILATERAL SALPINGO OOPHERECTOMY Bilateral 10/22/2013   Procedure: ROBOTIC ASSISTED BILATERAL SALPINGO OOPHORECTOMY;  Surgeon: Imagene Gurney A. Alycia Rossetti, MD;  Location: [facility];  Service: Gynecology;  Laterality: Bilateral;   ROTATOR CUFF REPAIR Right    TOTAL KNEE ARTHROPLASTY Right    TOTAL KNEE ARTHROPLASTY Right 02/04/2013   Procedure: RIGHT TOTAL KNEE ARTHROPLASTY;  Surgeon: Vickey Huger, MD;  Location: [facility];  Service: Orthopedics;  Laterality: Right;    FAMILY HISTORY: Family History  Problem Relation Age of Onset   Hypertension Mother    Pancreatic cancer Mother    Sleep apnea Mother    Sleep apnea Brother    Sleep apnea Maternal Uncle    Prostate cancer Other     SOCIAL HISTORY: Social History   Socioeconomic History   Marital status: Married    Spouse name: Not on file   Number of children: 2   Years of education: college   Highest education level: Not on file  Occupational History    Employer: [employer]  Tobacco Use   Smoking status: Never   Smokeless tobacco: Never  Vaping Use   Vaping Use: Never used  Substance and Sexual Activity   Alcohol use: No    Alcohol/week: 0.0 standard drinks   Drug use: No   Sexual activity: Yes    Birth control/protection: Surgical  Other Topics Concern   Not on file  Social History Narrative   **  Merged History Encounter **       Lives with husband Caffeine use:   Social Determinants of Radio broadcast assistant Strain: Not on file  Food Insecurity: Not on file  Transportation Needs: Not on file  Physical Activity: Not on file  Stress: Not on file  Social Connections: Not on file  Intimate Partner Violence: Not on file      PHYSICAL EXAM  Vitals:   03/31/21 0936  BP: 115/79  Pulse: 91  Weight: 223  lb 6.4 oz (101.3 kg)  Height: 5\' 4"  (1.626 m)   Body mass index is 38.35 kg/m.  Generalized: Well developed, in no acute distress  Chest: Lungs clear to auscultation bilaterally  Neurological examination  Mentation: Alert oriented to time, place, history taking. Follows all commands speech and language fluent Cranial nerve II-XII: Extraocular movements were full, visual field were full on confrontational test Head turning and shoulder shrug  were normal and symmetric. Motor: The motor testing reveals 5 over 5 strength of all 4 extremities. Good symmetric motor tone is noted throughout.  No rigidity or cogwheeling noted. Sensory: Sensory testing is intact to soft touch on all 4 extremities. No evidence of extinction is noted.  Coordination: Mild tremor noted with finger-nose-finger right greater than left.  Spiral drawing mildly impaired Gait and station: Gait is normal.  Good stride, no difficulty with turns   DIAGNOSTIC DATA (LABS, IMAGING, TESTING) - I reviewed patient records, labs, notes, testing and imaging myself where available.  Lab Results  Component Value Date   WBC 4.6 02/19/2021   HGB 14.3 02/19/2021   HCT 43.3 02/19/2021   MCV 83 02/19/2021   PLT 293 02/19/2021      Component Value Date/Time   NA 143 02/19/2021 1624   K 4.5 02/19/2021 1624   CL 100 02/19/2021 1624   CO2 28 02/19/2021 1624   GLUCOSE 105 (H) 02/19/2021 1624   GLUCOSE 174 (H) 10/01/2020 1206   BUN 16 02/19/2021 1624   CREATININE 1.28 (H) 02/19/2021 1624   CALCIUM 10.4 (H) 02/19/2021 1624   PROT 7.3 08/24/2020 0919   ALBUMIN 4.6 08/24/2020 0919   AST 33 08/24/2020 0919   ALT 35 (H) 08/24/2020 0919   ALKPHOS 97 08/24/2020 0919   BILITOT 0.5 08/24/2020 0919   GFRNONAA 58 (L) 10/01/2020 1206   GFRAA >60 11/14/2019 0955   Lab Results  Component Value Date   CHOL 146 08/24/2020   HDL 54 08/24/2020   LDLCALC 72 08/24/2020   TRIG 109 08/24/2020   CHOLHDL 2.7 08/24/2020   Lab Results   Component Value Date   HGBA1C 6.9 (H) 11/14/2019   No results found for: [id number] Lab Results  Component Value Date   TSH 0.556 02/07/2013      ASSESSMENT AND PLAN 70 y.o. year old female  has a past medical history of Allergic rhinitis, Anxiety, Arthritis, Cataracts, bilateral, CHF (congestive heart failure) ([place]) (4163), Complication of anesthesia, DDD (degenerative disc disease), lumbar, Diabetes mellitus without complication ([place]), Dizziness, GERD (gastroesophageal reflux disease), High cholesterol, History of colon polyps, History of migraine, Hypertension, Joint pain, Joint swelling, Migraine, Migraines, Pneumonia, Seizures ([place]), Shortness of breath (02/06/2013), Sleep apnea, Stroke ([place]), and TIA (transient ischemic attack). here with:  OSA on CPAP  - CPAP compliance excellent - Good treatment of AHI  - Encourage patient to use CPAP nightly and > 4 hours each night  2.  Tremor  -Patient presents with an action tremor.  Fairly mild at this  point.  We will continue to monitor for now.  No other symptoms of Parkinson's disease   - F/U in 6 months or sooner if needed    Ward Givens, MSN, NP-C 03/31/2021, 9:43 AM [facility] [address], [place], [place] [address] [phone]

## 2021-04-06 ENCOUNTER — Ambulatory Visit: Payer: Medicare PPO | Admitting: Gastroenterology

## 2021-04-22 ENCOUNTER — Ambulatory Visit (INDEPENDENT_AMBULATORY_CARE_PROVIDER_SITE_OTHER): Payer: Medicare PPO | Admitting: Internal Medicine

## 2021-04-22 ENCOUNTER — Encounter: Payer: Self-pay | Admitting: Internal Medicine

## 2021-04-22 VITALS — BP 132/88 | HR 78 | Ht 64.0 in | Wt 219.0 lb

## 2021-04-22 DIAGNOSIS — Z1211 Encounter for screening for malignant neoplasm of colon: Secondary | ICD-10-CM

## 2021-04-22 DIAGNOSIS — R1310 Dysphagia, unspecified: Secondary | ICD-10-CM | POA: Diagnosis not present

## 2021-04-22 DIAGNOSIS — R0989 Other specified symptoms and signs involving the circulatory and respiratory systems: Secondary | ICD-10-CM

## 2021-04-22 DIAGNOSIS — K219 Gastro-esophageal reflux disease without esophagitis: Secondary | ICD-10-CM

## 2021-04-22 NOTE — Progress Notes (Signed)
Chief Complaint: GERD  HPI : 70 year old female with history of GERD, DM, migraines, HFpEF, CVA, OSA, CAD presents with GERD  She has had issues with GERD for years. She is on rabeprazole but was told to potentially discontinue this medication because it may be affecting her kidney function. She is still taking rabeprazole 20 mg QOD at this time. She is still having reflux every day. Reflux symptoms were slightly better controlled when she was on QD dosing. She is currently taking her rabeprazole before bedtime, not before meals. Endorses some dysphagia. Food will sometimes take longer to go down. It will sometimes stall at the bottom of her throat. She feels like there is something in her throat. Has fairly severe chest discomfort as a result of her reflux. She has had issue with esophageal spasms for which she will take Mylanta PRN. She has tried famotidine 40 mg QD but this has not helped with her reflux at all. She has adjusted her diet to try to eat healthier, avoiding GERD inducing foods. Denies fam hx of colon cancer. Her grandfather had prostate cancer that travelled to colon. Mother had pancreatic cancer in her 90s. Endorses some issues with constipation on Ozempic and Jardiance, potentially due to decreased PO intake. She has been losing weight due to her Ozempic. Denies blood in stools. Has had 2x hysterectomy and ingnuinal hernia repairs. Denies abdominal pain. Denies blood thinners. Takes daily baby aspirin. She had a colonoscopy several years ago with Dr. Earlean Shawl.   Wt Readings from Last 3 Encounters:  04/22/21 219 lb (99.3 kg)  03/31/21 223 lb 6.4 oz (101.3 kg)  03/10/21 223 lb 3.2 oz (101.2 kg)    Past Medical History:  Diagnosis Date   Allergic rhinitis    Anxiety    Arthritis    knees and neck   Cataracts, bilateral    immature    CHF (congestive heart failure) ([facility]) 2014   after surgery   Complication of anesthesia    had heart failure after knee surgery 02/04/13   DDD  (degenerative disc disease), lumbar    Diabetes mellitus without complication (HCC)    Dizziness    takes Antivert prn   GERD (gastroesophageal reflux disease)    takes Aciphex prn   High cholesterol    but doesn't take any meds   History of colon polyps    History of migraine    last one 6-64months ago;takes Topamax nightly   Hypertension    takes Metoprolol and maxzide daily   Joint pain    Joint swelling    Migraine    Migraines    Pneumonia    at age 72   Seizures ([place])    hx of;last one 28yrs ago;no meds required;states it was brought on by med;only one ever had   Shortness of breath 02/06/2013   Sleep apnea    sleep study in epic from 2014-uses CPAP   Stroke ([place])    2 TIA   TIA (transient ischemic attack)      Past Surgical History:  Procedure Laterality Date   ABDOMINAL HYSTERECTOMY  1991   ANTERIOR LAT LUMBAR FUSION N/A 11/21/2019   Procedure: ANTERIOR LATERAL LUMBAR FUSION (XLIF) with lateral plate Lumbar two through lumbar three;  Surgeon: Melina Schools, MD;  Location: [facility];  Service: Orthopedics;  Laterality: N/A;  3 hrs   carpel tunnel Right 1988   COLONOSCOPY     disc surgery  1996   ESOPHAGOGASTRODUODENOSCOPY  EYE SURGERY Bilateral    cataract surgery with lens implants   [place]   x 2   KNEE SURGERY Left 2011   replacement-partial   LEFT HEART CATH AND CORONARY ANGIOGRAPHY N/A 02/22/2021   Procedure: LEFT HEART CATH AND CORONARY ANGIOGRAPHY;  Surgeon: Nelva Bush, MD;  Location: [facility];  Service: Cardiovascular;  Laterality: N/A;   LUMBAR LAMINECTOMY/DECOMPRESSION MICRODISCECTOMY Right 07/13/2017   Procedure: Disectomy Right L2-3;  Surgeon: Melina Schools, MD;  Location: [facility];  Service: Orthopedics;  Laterality: Right;  2.5 hrs   ROBOTIC ASSISTED BILATERAL SALPINGO OOPHERECTOMY Bilateral 10/22/2013   Procedure: ROBOTIC ASSISTED BILATERAL SALPINGO OOPHORECTOMY;  Surgeon: Imagene Gurney A. Alycia Rossetti, MD;  [facility];  Service:  Gynecology;  Laterality: Bilateral;   ROTATOR CUFF REPAIR Right    TOTAL KNEE ARTHROPLASTY Right    TOTAL KNEE ARTHROPLASTY Right 02/04/2013   Procedure: RIGHT TOTAL KNEE ARTHROPLASTY;  Surgeon: Vickey Huger, MD;  Location: [facility];  Service: Orthopedics;  Laterality: Right;   Family History  Problem Relation Age of Onset   Hypertension Mother    Pancreatic cancer Mother    Sleep apnea Mother    Sleep apnea Brother    Colon cancer Maternal Grandfather    Bone cancer Maternal Grandfather    Sleep apnea Maternal Uncle    Prostate cancer Other    Stomach cancer Neg Hx    Esophageal cancer Neg Hx    Social History   Tobacco Use   Smoking status: Never   Smokeless tobacco: Never  Vaping Use   Vaping Use: Never used  Substance Use Topics   Alcohol use: No    Alcohol/week: 0.0 standard drinks   Drug use: No   Current Outpatient Medications  Medication Sig Dispense Refill   Accu-Chek Softclix Lancets lancets USE TO TEST EVERY DAY     acetaminophen (TYLENOL) 650 MG CR tablet Take 1,300 mg by mouth every 8 (eight) hours as needed for pain.     aspirin EC 81 MG tablet Take 81 mg by mouth daily.     cetirizine (ZYRTEC) 10 MG tablet Take 10 mg by mouth daily.      cholecalciferol (VITAMIN D3) 25 MCG (1000 UNIT) tablet Take 1,000 Units by mouth daily.     cyclobenzaprine (FLEXERIL) 10 MG tablet Take 10 mg by mouth 3 (three) times daily as needed for muscle spasms.     empagliflozin (JARDIANCE) 10 MG TABS tablet Take 1 tablet (10 mg total) by mouth daily before breakfast. 90 tablet 3   Evolocumab (REPATHA SURECLICK) 161 MG/ML SOAJ INJECT 1 PEN INTO THE SKIN EVERY 14 DAYS 6 mL 3   ezetimibe (ZETIA) 10 MG tablet Take 1 tablet (10 mg total) by mouth daily. (Patient taking differently: Take 10 mg by mouth every other day.) 90 tablet 3   fluticasone (FLONASE) 50 MCG/ACT nasal spray Place 1-2 sprays into both nostrils at bedtime. As needed     glucose blood (ACCU-CHEK AVIVA PLUS) test strip TEST  EVERY DAY AS DIRECTED     lidocaine (LIDODERM) 5 % Place 1 patch onto the skin daily as needed (pain).     LORazepam (ATIVAN) 0.5 MG tablet Take 0.25-0.5 mg by mouth daily as needed for anxiety.      MegaRed Omega-3 Krill Oil 500 MG CAPS Take 500 mg by mouth daily.     meperidine (DEMEROL) 50 MG tablet Take 50 mg by mouth every 6 (six) hours as needed for severe pain.  metoprolol succinate (TOPROL-XL) 50 MG 24 hr tablet Take 50 mg by mouth daily. Take with or immediately following a meal.     nitroGLYCERIN (NITROSTAT) 0.4 MG SL tablet Place 1 tablet (0.4 mg total) under the tongue every 5 (five) minutes as needed for chest pain. Take up to 3 tablets 25 tablet 2   RABEprazole (ACIPHEX) 20 MG tablet Take 20 mg by mouth every other day.     Semaglutide,0.25 or 0.5MG /DOS, (OZEMPIC, 0.25 OR 0.5 MG/DOSE,) 2 MG/1.5ML SOPN Inject 0.25 mg into the skin every Sunday.     triamterene-hydrochlorothiazide (MAXZIDE) 75-50 MG tablet Take 0.5 tablets by mouth daily. Pt take one half tablet daily  1   No current facility-administered medications for this visit.   Allergies  Allergen Reactions   Codeine Hives   Statins Other (See Comments)    Muscle pain   Ambien [Zolpidem] Other (See Comments)    headache   Bempedoic Acid     Increase in uric acid   Budesonide     Other reaction(s): nose bleeds, headaches   Crestor [Rosuvastatin]     Other reaction(s): cramps, nausea   Demerol [Meperidine Hcl]     Other reaction(s): oversedation   Edarbi [Azilsartan]     Other reaction(s): migraines   Fexofenadine     Other reaction(s): headache   Olmesartan     Other reaction(s): not effective   Sulfamethoxazole-Trimethoprim     Other reaction(s): dizziness   Valsartan-Hydrochlorothiazide     Other reaction(s): not effective   Welchol [Colesevelam]     abdominal pain   Adhesive [Tape] Rash   Latex Rash   Tapentadol Rash     Review of Systems: All systems reviewed and negative except where noted in  HPI.   Physical Exam: BP 132/88    Pulse 78    Ht 5\' 4"  (1.626 m)    Wt 219 lb (99.3 kg)    SpO2 98%    BMI 37.59 kg/m  Constitutional: Pleasant,well-developed, female in no acute distress. HEENT: Normocephalic and atraumatic. Conjunctivae are normal. No scleral icterus. Cardiovascular: Normal rate, regular rhythm.  Pulmonary/chest: Effort normal and breath sounds normal. No wheezing, rales or rhonchi. Abdominal: Soft, nondistended, nontender. Bowel sounds active throughout. There are no masses palpable. No hepatomegaly. Extremities: No edema Neurological: Alert and oriented to person place and time. Skin: Skin is warm and dry. No rashes noted. Psychiatric: Normal mood and affect. Behavior is normal.  Labs 07/2020: LFTs with mildly elevated ALT of 35, other LFTs nml.  Labs 01/2021: CBC nml, BMP with mildly elevated Cr of 1.28, mildly elevated Ca of 10.4  ASSESSMENT AND PLAN:  GERD Dysphagia Globus Colon cancer screening Patient presents with uncontrolled GERD, particularly after decreasing the frequency of her Aciphex. PPIs can rarely cause AKI, but I would not suspect it to be the main culprit when the patient has been on Aciphex well before onset of her elevated Cr. Would look for alternative etiologies of her elevated creatinine first. Due to the patient's description of dysphagia, will plan for EGD and barium swallow for further evaluation. Patient does not think that she is due for colon cancer screening yet, but she was unsure about the timing or results of her last colonoscopy so we will obtain records. - Reflux friendly diet - Continue rabeprazole 20 mg QD, take 30 min before a meal - Barium swallow - EGD [facility] - Obtain records of last colonoscopy from Dr. Nicholes Rough, MD  I spent 61  minutes of time, including in depth chart review, independent review of results as outlined above, communicating results with the patient directly, face-to-face time with the patient,  coordinating care, ordering studies and medications as appropriate, and documentation.

## 2021-04-22 NOTE — Patient Instructions (Signed)
If you are age 71 or older, your body mass index should be between 23-30. Your Body mass index is 37.59 kg/m. If this is out of the aforementioned range listed, please consider follow up with your Primary Care Provider.  If you are age 71 or younger, your body mass index should be between 19-25. Your Body mass index is 37.59 kg/m. If this is out of the aformentioned range listed, please consider follow up with your Primary Care Provider.   ________________________________________________________  The [facility] providers would like to encourage you to use [facility] to communicate with providers for non-urgent requests or questions.  Due to long hold times on the telephone, sending your provider a message by [facility] may be a faster and more efficient way to get a response.  Please allow 48 business hours for a response.  Please remember that this is for non-urgent requests.  _______________________________________________________  Take Aciphex 30 minutes before each meal.  You have been scheduled for an endoscopy. Please follow written instructions given to you at your visit today. If you use inhalers (even only as needed), please bring them with you on the day of your procedure.  You have been scheduled for a Barium Esophogram at [facility] (1st floor of the hospital) on 04/30/21 at 10:30am. Please arrive 30 minutes prior to your appointment for registration. Make certain not to have anything to eat or drink 3 hours prior to your test. If you need to reschedule for any reason, please contact radiology at [phone] to do so. __________________________________________________________________ A barium swallow is an examination that concentrates on views of the esophagus. This tends to be a double contrast exam (barium and two liquids which, when combined, create a gas to distend the wall of the oesophagus) or single contrast (non-ionic iodine based). The study is usually tailored to your  symptoms so a good history is essential. Attention is paid during the study to the form, structure and configuration of the esophagus, looking for functional disorders (such as aspiration, dysphagia, achalasia, motility and reflux) EXAMINATION You may be asked to change into a gown, depending on the type of swallow being performed. A radiologist and radiographer will perform the procedure. The radiologist will advise you of the type of contrast selected for your procedure and direct you during the exam. You will be asked to stand, sit or lie in several different positions and to hold a small amount of fluid in your mouth before being asked to swallow while the imaging is performed .In some instances you may be asked to swallow barium coated marshmallows to assess the motility of a solid food bolus. The exam can be recorded as a digital or video fluoroscopy procedure. POST PROCEDURE It will take 1-2 days for the barium to pass through your system. To facilitate this, it is important, unless otherwise directed, to increase your fluids for the next 24-48hrs and to resume your normal diet.  This test typically takes about 30 minutes to perform. __________________________________________________________________________________

## 2021-04-28 ENCOUNTER — Other Ambulatory Visit: Payer: Self-pay

## 2021-04-28 ENCOUNTER — Ambulatory Visit (AMBULATORY_SURGERY_CENTER): Payer: Medicare PPO | Admitting: Internal Medicine

## 2021-04-28 ENCOUNTER — Encounter: Payer: Self-pay | Admitting: Internal Medicine

## 2021-04-28 VITALS — BP 136/86 | HR 82 | Temp 98.0°F | Resp 15 | Ht 64.0 in | Wt 219.0 lb

## 2021-04-28 DIAGNOSIS — K319 Disease of stomach and duodenum, unspecified: Secondary | ICD-10-CM | POA: Diagnosis not present

## 2021-04-28 DIAGNOSIS — R12 Heartburn: Secondary | ICD-10-CM | POA: Diagnosis not present

## 2021-04-28 DIAGNOSIS — R1310 Dysphagia, unspecified: Secondary | ICD-10-CM | POA: Diagnosis not present

## 2021-04-28 DIAGNOSIS — K222 Esophageal obstruction: Secondary | ICD-10-CM

## 2021-04-28 DIAGNOSIS — K219 Gastro-esophageal reflux disease without esophagitis: Secondary | ICD-10-CM | POA: Diagnosis not present

## 2021-04-28 DIAGNOSIS — R0989 Other specified symptoms and signs involving the circulatory and respiratory systems: Secondary | ICD-10-CM

## 2021-04-28 MED ORDER — SODIUM CHLORIDE 0.9 % IV SOLN: 500.0000 mL | Freq: Once | INTRAVENOUS | Status: DC

## 2021-04-28 NOTE — Patient Instructions (Addendum)
FOLLOW DILATATION DIET GIVEN TO YOU TODAY  AWAIT BIOPSY RESULTS   HANDOUT ON GASTRITIS GIVEN TO YOU TODAY  CONTINUE [place] ( REFLUX MED)   YOU HAD AN ENDOSCOPIC PROCEDURE TODAY AT [facility]:   Refer to the procedure report that was given to you for any specific questions about what was found during the examination.  If the procedure report does not answer your questions, please call your gastroenterologist to clarify.  If you requested that your care partner not be given the details of your procedure findings, then the procedure report has been included in a sealed envelope for you to review at your convenience later.  YOU SHOULD EXPECT: Some feelings of bloating in the abdomen. Passage of more gas than usual.  Walking can help get rid of the air that was put into your GI tract during the procedure and reduce the bloating. If you had a lower endoscopy (such as a colonoscopy or flexible sigmoidoscopy) you may notice spotting of blood in your stool or on the toilet paper. If you underwent a bowel prep for your procedure, you may not have a normal bowel movement for a few days.  Please Note:  You might notice some irritation and congestion in your nose or some drainage.  This is from the oxygen used during your procedure.  There is no need for concern and it should clear up in a day or so.  SYMPTOMS TO REPORT IMMEDIATELY:  Following upper endoscopy (EGD)  Vomiting of blood or coffee ground material  New chest pain or pain under the shoulder blades  Painful or persistently difficult swallowing  New shortness of breath  Fever of 100F or higher  Black, tarry-looking stools  For urgent or emergent issues, a gastroenterologist can be reached at any hour by calling [phone]. Do not use MyChart messaging for urgent concerns.    DIET:  FOLLOW DILATATION DIET GIVEN TO YOU TODAY  Drink plenty of fluids but you should avoid alcoholic beverages for 24  hours.  ACTIVITY:  You should plan to take it easy for the rest of today and you should NOT DRIVE or use heavy machinery until tomorrow (because of the sedation medicines used during the test).    FOLLOW UP: Our staff will call the number listed on your records 48-72 hours following your procedure to check on you and address any questions or concerns that you may have regarding the information given to you following your procedure. If we do not reach you, we will leave a message.  We will attempt to reach you two times.  During this call, we will ask if you have developed any symptoms of COVID 19. If you develop any symptoms (ie: fever, flu-like symptoms, shortness of breath, cough etc.) before then, please call [phone].  If you test positive for Covid 19 in the 2 weeks post procedure, please call and report this information to [place].    If any biopsies were taken you will be contacted by phone or by letter within the next 1-3 weeks.  Please call us at [phone] if you have not heard about the biopsies in 3 weeks.    SIGNATURES/CONFIDENTIALITY: You and/or your care partner have signed paperwork which will be entered into your electronic medical record.  These signatures attest to the fact that that the information above on your After Visit Summary has been reviewed and is understood.  Full responsibility of the confidentiality of this discharge information lies  with you and/or your care-partner.

## 2021-04-28 NOTE — Progress Notes (Unsigned)
Received colonoscopy records from Dr. Liliane Channel office.  Colonoscopy 05/30/01: Multiple polyps sigmoid colon to rectum, 4-7 mm.   Colonoscopy 10/06/14: Few right sided diverticulosis. Excellent prep. Repeat colonoscopy in 10 years.  Colonoscopy 06/09/17: Normal colonoscopy. Excellent prep. Repeat colonoscopy in 5-10 years.  Patient could have next colonoscopy in 2029 if she wishes, though she will be 70 years old at that time and could discontinue colon cancer screening if she wishes.

## 2021-04-28 NOTE — Op Note (Addendum)
[place] Patient Name: Robyn Aguilar Procedure Date: 04/28/2021 7:14 AM MRN: [id number] Endoscopist: Sonny Masters "Robyn Aguilar ,  Age: 70 Referring MD:  Date of Birth: 23-Nov-1950 Gender: Female Account #: [id number] Procedure:                Upper GI endoscopy Indications:              Dysphagia, Heartburn Medicines:                Monitored Anesthesia Care Procedure:                Pre-Anesthesia Assessment:                           - Prior to the procedure, a History and Physical                            was performed, and patient medications and                            allergies were reviewed. The patient's tolerance of                            previous anesthesia was also reviewed. The risks                            and benefits of the procedure and the sedation                            options and risks were discussed with the patient.                            All questions were answered, and informed consent                            was obtained. Prior Anticoagulants: The patient has                            taken no previous anticoagulant or antiplatelet                            agents. ASA Grade Assessment: III - A patient with                            severe systemic disease. After reviewing the risks                            and benefits, the patient was deemed in                            satisfactory condition to undergo the procedure.                           After obtaining informed consent, the endoscope was  passed under direct vision. Throughout the                            procedure, the patient's blood pressure, pulse, and                            oxygen saturations were monitored continuously. The                            GIF [id number] #[id number] was introduced through the                            mouth, and advanced to the second part of duodenum.                            The upper GI  endoscopy was accomplished without                            difficulty. The patient tolerated the procedure                            well. Scope In: Scope Out: Findings:                 Biopsies were taken with a cold forceps in the                            proximal esophagus and in the distal esophagus                            (including the strictures) for histology.                           Two benign-appearing, intrinsic moderate                            (circumferential scarring or stenosis; an endoscope                            may pass) stenoses were found in the distal                            esophagus. The stenoses were traversed. A TTS                            dilator was passed through the scope. Dilation with                            an 18-19-20 mm balloon dilator was performed to 20                            mm. There was mucosal disruption at the end of the  dilation.                           A non-obstructing Schatzki ring was found at the                            gastroesophageal junction. This was biopsied with a                            cold forceps for disruption of the ring.                           Localized mild inflammation characterized by                            congestion (edema) and erythema was found in the                            gastric antrum. Biopsies were taken with a cold                            forceps for Helicobacter pylori testing.                           The examined duodenum was normal. Biopsies were                            taken with a cold forceps for histology. Complications:            No immediate complications. Estimated Blood Loss:     Estimated blood loss was minimal. Impression:               - Benign-appearing esophageal stenoses. Dilated.                           - Non-obstructing Schatzki ring. Biopsied.                           - Gastritis. Biopsied.                            - Normal examined duodenum. Biopsied.                           - Biopsies were taken with a cold forceps for                            histology in the proximal esophagus and in the                            distal esophagus. Recommendation:           - Discharge patient to home (with escort).                           - Continue Aciphex.                           -  Await pathology results.                           - The findings and recommendations were discussed                            with the patient.                           - Return to GI clinic in 2 months. Sonny Masters "Robyn Aguilar,  04/28/2021 8:41:19 AM

## 2021-04-28 NOTE — Progress Notes (Signed)
A and O x3. Report to RN. Tolerated MAC anesthesia well. Teeth unchanged after procedure.

## 2021-04-28 NOTE — Progress Notes (Signed)
Called to room to assist during endoscopic procedure.  Patient ID and intended procedure confirmed with present staff. Received instructions for my participation in the procedure from the performing physician.  

## 2021-04-28 NOTE — Progress Notes (Signed)
GASTROENTEROLOGY PROCEDURE H&P NOTE   Primary Care Physician: Harlan Stains, MD    Reason for Procedure:   Dysphagia, globus sensation, GERD  Plan:    EGD  Patient is appropriate for endoscopic procedure(s) in the ambulatory ([place]) setting.  The nature of the procedure, as well as the risks, benefits, and alternatives were carefully and thoroughly reviewed with the patient. Ample time for discussion and questions allowed. The patient understood, was satisfied, and agreed to proceed.     HPI: Robyn Aguilar is a 70 y.o. female who presents for EGD for evaluation of dysphagia, globus, GERD .  Patient was most recently seen in the Gastroenterology Clinic on 04/22/21.  No interval change in medical history since that appointment. Please refer to that note for full details regarding GI history and clinical presentation.   Past Medical History:  Diagnosis Date   Allergic rhinitis    Anxiety    Arthritis    knees and neck   Cataracts, bilateral    immature    CHF (congestive heart failure) ([place]) 2014   after surgery   Complication of anesthesia    had heart failure after knee surgery 02/04/13   DDD (degenerative disc disease), lumbar    Diabetes mellitus without complication (HCC)    Dizziness    takes Antivert prn   GERD (gastroesophageal reflux disease)    takes Aciphex prn   High cholesterol    but doesn't take any meds   History of colon polyps    History of migraine    last one 6-57months ago;takes Topamax nightly   Hypertension    takes Metoprolol and maxzide daily   Joint pain    Joint swelling    Migraine    Migraines    Pneumonia    at age 64   Seizures ([place])    hx of;last one 31yrs ago;no meds required;states it was brought on by med;only one ever had   Shortness of breath 02/06/2013   Sleep apnea    sleep study in epic from 2014-uses CPAP   Stroke ([place])    2 TIA   TIA (transient ischemic attack)     Past Surgical History:  Procedure  Laterality Date   ABDOMINAL HYSTERECTOMY  1991   ANTERIOR LAT LUMBAR FUSION N/A 11/21/2019   Procedure: ANTERIOR LATERAL LUMBAR FUSION (XLIF) with lateral plate Lumbar two through lumbar three;  Surgeon: Melina Schools, MD;  Location: [place];  Service: Orthopedics;  Laterality: N/A;  3 hrs   carpel tunnel Right 1988   COLONOSCOPY     disc surgery  1996   ESOPHAGOGASTRODUODENOSCOPY     EYE SURGERY Bilateral    cataract surgery with lens implants   HERNIA REPAIR  1987   x 2   KNEE SURGERY Left 2011   replacement-partial   LEFT HEART CATH AND CORONARY ANGIOGRAPHY N/A 02/22/2021   Procedure: LEFT HEART CATH AND CORONARY ANGIOGRAPHY;  Surgeon: Nelva Bush, MD;  Location: [place] [facility];  Service: Cardiovascular;  Laterality: N/A;   LUMBAR LAMINECTOMY/DECOMPRESSION MICRODISCECTOMY Right 07/13/2017   Procedure: Disectomy Right L2-3;  Surgeon: Melina Schools, MD;  Location: [place];  Service: Orthopedics;  Laterality: Right;  2.5 hrs   ROBOTIC ASSISTED BILATERAL SALPINGO OOPHERECTOMY Bilateral 10/22/2013   Procedure: ROBOTIC ASSISTED BILATERAL SALPINGO OOPHORECTOMY;  Surgeon: Imagene Gurney A. Alycia Rossetti, MD;  Location: [facility];  Service: Gynecology;  Laterality: Bilateral;   ROTATOR CUFF REPAIR Right    TOTAL KNEE ARTHROPLASTY Right    TOTAL KNEE  ARTHROPLASTY Right 02/04/2013   Procedure: RIGHT TOTAL KNEE ARTHROPLASTY;  Surgeon: Vickey Huger, MD;  Location: [place];  Service: Orthopedics;  Laterality: Right;    Prior to Admission medications   Medication Sig Start Date End Date Taking? Authorizing Provider  Accu-Chek Softclix Lancets lancets USE TO TEST EVERY DAY 10/14/20  Yes [provider]  acetaminophen (TYLENOL) 650 MG CR tablet Take 1,300 mg by mouth every 8 (eight) hours as needed for pain.   Yes [provider]  aspirin EC 81 MG tablet Take 81 mg by mouth daily.   Yes [provider]  cetirizine (ZYRTEC) 10 MG tablet Take 10 mg by mouth daily.  11/05/14  Yes [provider]  cholecalciferol (VITAMIN D3) 25 MCG (1000 UNIT) tablet Take 1,000 Units by mouth daily.   Yes [provider]  empagliflozin (JARDIANCE) 10 MG TABS tablet Take 1 tablet (10 mg total) by mouth daily before breakfast. 02/24/21  Yes Belva Crome, MD  Evolocumab (REPATHA SURECLICK) 301 MG/ML SOAJ INJECT 1 PEN INTO THE SKIN EVERY 14 DAYS 03/01/21  Yes Belva Crome, MD  ezetimibe (ZETIA) 10 MG tablet Take 1 tablet (10 mg total) by mouth daily. Patient taking differently: Take 10 mg by mouth every other day. 05/27/20 05/22/21 Yes Belva Crome, MD  glucose blood (ACCU-CHEK AVIVA PLUS) test strip TEST EVERY DAY AS DIRECTED 10/10/20  Yes [provider]  MegaRed Omega-3 Krill Oil 500 MG CAPS Take 500 mg by mouth daily.   Yes [provider]  metoprolol succinate (TOPROL-XL) 50 MG 24 hr tablet Take 50 mg by mouth daily. Take with or immediately following a meal.   Yes [provider]  RABEprazole (ACIPHEX) 20 MG tablet Take 20 mg by mouth every other day.   Yes [provider]  Semaglutide,0.25 or 0.5MG /DOS, (OZEMPIC, 0.25 OR 0.5 MG/DOSE,) 2 MG/1.5ML SOPN Inject 0.25 mg into the skin every Sunday.   Yes [provider]  triamterene-hydrochlorothiazide (MAXZIDE) 75-50 MG tablet Take 0.5 tablets by mouth daily. Pt take one half tablet daily 01/29/15  Yes [provider]  cyclobenzaprine (FLEXERIL) 10 MG tablet Take 10 mg by mouth 3 (three) times daily as needed for muscle spasms. Patient not taking: Reported on 04/28/2021    [provider]  fluticasone (FLONASE) 50 MCG/ACT nasal spray Place 1-2 sprays into both nostrils at bedtime. As needed Patient not taking: Reported on 04/28/2021 08/26/14   [provider]  lidocaine (LIDODERM) 5 % Place 1 patch onto the skin daily as needed (pain). Patient not taking: Reported on 04/28/2021 11/18/20   [provider]  LORazepam (ATIVAN) 0.5 MG tablet Take 0.25-0.5 mg  by mouth daily as needed for anxiety.  Patient not taking: Reported on 04/28/2021 01/09/19   [provider]  meperidine (DEMEROL) 50 MG tablet Take 50 mg by mouth every 6 (six) hours as needed for severe pain. Patient not taking: Reported on 04/28/2021 02/17/20   [provider]  nitroGLYCERIN (NITROSTAT) 0.4 MG SL tablet Place 1 tablet (0.4 mg total) under the tongue every 5 (five) minutes as needed for chest pain. Take up to 3 tablets Patient not taking: Reported on 04/28/2021 02/19/21   Isaiah Serge, NP    Current Outpatient Medications  Medication Sig Dispense Refill   Accu-Chek Softclix Lancets lancets USE TO TEST EVERY DAY     acetaminophen (TYLENOL) 650 MG CR tablet Take 1,300 mg by mouth every 8 (eight) hours as needed for pain.  aspirin EC 81 MG tablet Take 81 mg by mouth daily.     cetirizine (ZYRTEC) 10 MG tablet Take 10 mg by mouth daily.      cholecalciferol (VITAMIN D3) 25 MCG (1000 UNIT) tablet Take 1,000 Units by mouth daily.     empagliflozin (JARDIANCE) 10 MG TABS tablet Take 1 tablet (10 mg total) by mouth daily before breakfast. 90 tablet 3   Evolocumab (REPATHA SURECLICK) 644 MG/ML SOAJ INJECT 1 PEN INTO THE SKIN EVERY 14 DAYS 6 mL 3   ezetimibe (ZETIA) 10 MG tablet Take 1 tablet (10 mg total) by mouth daily. (Patient taking differently: Take 10 mg by mouth every other day.) 90 tablet 3   glucose blood (ACCU-CHEK AVIVA PLUS) test strip TEST EVERY DAY AS DIRECTED     MegaRed Omega-3 Krill Oil 500 MG CAPS Take 500 mg by mouth daily.     metoprolol succinate (TOPROL-XL) 50 MG 24 hr tablet Take 50 mg by mouth daily. Take with or immediately following a meal.     RABEprazole (ACIPHEX) 20 MG tablet Take 20 mg by mouth every other day.     Semaglutide,0.25 or 0.5MG /DOS, (OZEMPIC, 0.25 OR 0.5 MG/DOSE,) 2 MG/1.5ML SOPN Inject 0.25 mg into the skin every Sunday.     triamterene-hydrochlorothiazide (MAXZIDE) 75-50 MG tablet Take 0.5 tablets by mouth daily. Pt  take one half tablet daily  1   cyclobenzaprine (FLEXERIL) 10 MG tablet Take 10 mg by mouth 3 (three) times daily as needed for muscle spasms. (Patient not taking: Reported on 04/28/2021)     fluticasone (FLONASE) 50 MCG/ACT nasal spray Place 1-2 sprays into both nostrils at bedtime. As needed (Patient not taking: Reported on 04/28/2021)     lidocaine (LIDODERM) 5 % Place 1 patch onto the skin daily as needed (pain). (Patient not taking: Reported on 04/28/2021)     LORazepam (ATIVAN) 0.5 MG tablet Take 0.25-0.5 mg by mouth daily as needed for anxiety.  (Patient not taking: Reported on 04/28/2021)     meperidine (DEMEROL) 50 MG tablet Take 50 mg by mouth every 6 (six) hours as needed for severe pain. (Patient not taking: Reported on 04/28/2021)     nitroGLYCERIN (NITROSTAT) 0.4 MG SL tablet Place 1 tablet (0.4 mg total) under the tongue every 5 (five) minutes as needed for chest pain. Take up to 3 tablets (Patient not taking: Reported on 04/28/2021) 25 tablet 2   Current Facility-Administered Medications  Medication Dose Route Frequency Provider Last Rate Last Admin   0.9 %  sodium chloride infusion  500 mL Intravenous Once Sharyn Creamer, MD        Allergies as of 04/28/2021 - Review Complete 04/28/2021  Allergen Reaction Noted   Codeine Hives 09/12/2012   Statins Other (See Comments) 06/26/2017   Ambien [zolpidem] Other (See Comments) 08/03/2020   Bempedoic acid  01/08/2020   Budesonide  08/03/2020   Crestor [rosuvastatin]  08/03/2020   Demerol [meperidine hcl]  08/03/2020   Edarbi [azilsartan]  08/03/2020   Fexofenadine  08/03/2020   Olmesartan  08/03/2020   Sulfamethoxazole-trimethoprim  08/03/2020   Valsartan-hydrochlorothiazide  08/03/2020   Welchol [colesevelam]  08/03/2020   Adhesive [tape] Rash 10/15/2013   Latex Rash 03/08/2013   Tapentadol Rash 10/15/2013    Family History  Problem Relation Age of Onset   Hypertension Mother    Pancreatic cancer Mother    Sleep apnea  Mother    Sleep apnea Brother    Colon cancer Maternal Grandfather  Bone cancer Maternal Grandfather    Sleep apnea Maternal Uncle    Prostate cancer Other    Stomach cancer Neg Hx    Esophageal cancer Neg Hx     Social History   Socioeconomic History   Marital status: Married    Spouse name: Not on file   Number of children: 2   Years of education: college   Highest education level: Not on file  Occupational History    Employer: [employer]  Tobacco Use   Smoking status: Never   Smokeless tobacco: Never  Vaping Use   Vaping Use: Never used  Substance and Sexual Activity   Alcohol use: No    Alcohol/week: 0.0 standard drinks   Drug use: No   Sexual activity: Yes    Birth control/protection: Surgical  Other Topics Concern   Not on file  Social History Narrative   ** Merged History Encounter **       Lives with husband Caffeine use:   Social Determinants of Radio broadcast assistant Strain: Not on file  Food Insecurity: Not on file  Transportation Needs: Not on file  Physical Activity: Not on file  Stress: Not on file  Social Connections: Not on file  Intimate Partner Violence: Not on file    Physical Exam: Vital signs in last 24 hours: BP (!) 170/99    Pulse 87    Temp 98 F (36.7 C) (Skin)    Resp (!) 9    Ht 5\' 4"  (1.626 m)    Wt 219 lb (99.3 kg)    SpO2 100%    BMI 37.59 kg/m  GEN: NAD EYE: Sclerae anicteric ENT: MMM CV: Non-tachycardic Pulm: No increased WOB GI: Soft NEURO:  Alert & Oriented   [name], MD [facility]   04/28/2021 8:04 AM

## 2021-04-30 ENCOUNTER — Telehealth: Payer: Self-pay | Admitting: *Deleted

## 2021-04-30 ENCOUNTER — Ambulatory Visit (HOSPITAL_COMMUNITY)
Admission: RE | Admit: 2021-04-30 | Discharge: 2021-04-30 | Disposition: A | Discharge status: Home / Self Care | Payer: Medicare PPO | Source: Ambulatory Visit | Attending: Internal Medicine | Admitting: Internal Medicine

## 2021-04-30 ENCOUNTER — Other Ambulatory Visit: Payer: Self-pay

## 2021-04-30 DIAGNOSIS — R1310 Dysphagia, unspecified: Secondary | ICD-10-CM

## 2021-04-30 DIAGNOSIS — R0989 Other specified symptoms and signs involving the circulatory and respiratory systems: Secondary | ICD-10-CM

## 2021-04-30 DIAGNOSIS — Z1211 Encounter for screening for malignant neoplasm of colon: Secondary | ICD-10-CM

## 2021-04-30 DIAGNOSIS — K219 Gastro-esophageal reflux disease without esophagitis: Secondary | ICD-10-CM

## 2021-04-30 NOTE — Telephone Encounter (Signed)
°  Follow up Call-  Call back number 04/28/2021  Post procedure Call Back phone  # [phone]  Permission to leave phone message Yes  Some recent data might be hidden     First attempt for follow up phone call. No answer at number given.  Left message on voicemail.

## 2021-04-30 NOTE — Telephone Encounter (Signed)
°  Follow up Call-  Call back number 04/28/2021  Post procedure Call Back phone  # [phone]  Permission to leave phone message Yes  Some recent data might be hidden     Patient questions:  Do you have a fever, pain , or abdominal swelling? No. Pain Score  0 *  Have you tolerated food without any problems? Yes.    Have you been able to return to your normal activities? Yes.    Do you have any questions about your discharge instructions: Diet   No. Medications  No. Follow up visit  No.  Do you have questions or concerns about your Care? Yes.  Patient went for her Barium swallow yesterday but the radiologist canceled as it was too close to her EGD from Tuesday has been rescheduled.  Actions: * If pain score is 4 or above: No action needed, pain <4.

## 2021-05-04 ENCOUNTER — Other Ambulatory Visit: Payer: Self-pay

## 2021-05-04 ENCOUNTER — Ambulatory Visit (HOSPITAL_COMMUNITY)
Admission: RE | Admit: 2021-05-04 | Discharge: 2021-05-04 | Disposition: A | Discharge status: Home / Self Care | Payer: Medicare PPO | Source: Ambulatory Visit | Attending: Internal Medicine | Admitting: Internal Medicine

## 2021-05-04 DIAGNOSIS — R1310 Dysphagia, unspecified: Secondary | ICD-10-CM | POA: Insufficient documentation

## 2021-05-04 DIAGNOSIS — R0989 Other specified symptoms and signs involving the circulatory and respiratory systems: Secondary | ICD-10-CM | POA: Insufficient documentation

## 2021-05-04 DIAGNOSIS — Z1211 Encounter for screening for malignant neoplasm of colon: Secondary | ICD-10-CM | POA: Insufficient documentation

## 2021-05-04 DIAGNOSIS — K219 Gastro-esophageal reflux disease without esophagitis: Secondary | ICD-10-CM | POA: Insufficient documentation

## 2021-05-04 DIAGNOSIS — K224 Dyskinesia of esophagus: Secondary | ICD-10-CM | POA: Diagnosis not present

## 2021-05-06 ENCOUNTER — Other Ambulatory Visit: Payer: Medicare PPO

## 2021-05-09 ENCOUNTER — Encounter: Payer: Self-pay | Admitting: Internal Medicine

## 2021-05-11 ENCOUNTER — Telehealth: Payer: Self-pay | Admitting: Pharmacist

## 2021-05-11 NOTE — Telephone Encounter (Signed)
Pt left voicemail asking about renewing [facility] for her [place] [facility]. Her last grant expired 04/13/21. [facility], are you able to reactivate [employer] grant and provide updated info to pt please?

## 2021-05-11 NOTE — Telephone Encounter (Signed)
Called and spoke w/pt and provided the information from [facility] and they voiced gratitude and understanding.  Pharmacy Card CARD NO. [id number]   CARD STATUS Active   BIN [id number]   PCN [id number]   PC GROUP [id number]   HELP DESK [phone]   PROVIDER [id number]   PROCESSOR PDMI

## 2021-05-12 DIAGNOSIS — E041 Nontoxic single thyroid nodule: Secondary | ICD-10-CM | POA: Diagnosis not present

## 2021-05-12 DIAGNOSIS — E559 Vitamin D deficiency, unspecified: Secondary | ICD-10-CM | POA: Diagnosis not present

## 2021-05-17 DIAGNOSIS — N183 Chronic kidney disease, stage 3 unspecified: Secondary | ICD-10-CM | POA: Diagnosis not present

## 2021-05-17 DIAGNOSIS — E119 Type 2 diabetes mellitus without complications: Secondary | ICD-10-CM | POA: Diagnosis not present

## 2021-05-17 DIAGNOSIS — E041 Nontoxic single thyroid nodule: Secondary | ICD-10-CM | POA: Diagnosis not present

## 2021-05-17 DIAGNOSIS — I2510 Atherosclerotic heart disease of native coronary artery without angina pectoris: Secondary | ICD-10-CM | POA: Diagnosis not present

## 2021-05-17 DIAGNOSIS — E782 Mixed hyperlipidemia: Secondary | ICD-10-CM | POA: Diagnosis not present

## 2021-05-17 DIAGNOSIS — I10 Essential (primary) hypertension: Secondary | ICD-10-CM | POA: Diagnosis not present

## 2021-05-19 ENCOUNTER — Encounter: Payer: Self-pay | Admitting: Internal Medicine

## 2021-05-26 ENCOUNTER — Other Ambulatory Visit: Payer: Self-pay | Admitting: Internal Medicine

## 2021-05-26 DIAGNOSIS — E041 Nontoxic single thyroid nodule: Secondary | ICD-10-CM

## 2021-06-13 NOTE — Progress Notes (Signed)
Cardiology Office Note:    Date:  06/16/2021   ID:  Robyn Aguilar, DOB 1951-01-31, MRN [id number]  PCP:  Harlan Stains, MD  Cardiologist:  Sinclair Grooms, MD   Referring MD: Harlan Stains, MD   Chief Complaint  Patient presents with   Congestive Heart Failure   Coronary Artery Disease   Hypertension    History of Present Illness:    Robyn Aguilar is a 71 y.o. female with a hx of LVH, hyperlipidemia on Zetia and Repatha, OSA on CPAP, history of TIAs, DM2, multiple allergies including latex, and statins.   Overall doing better.  Has questions about her heart catheterization and medication adjustments that were made.  She has had no recurrence of chest pain.  Coronary angiography as noted below did not reveal any significant obstructive coronary disease but she did have significant elevation in LVEDP  Past Medical History:  Diagnosis Date   Allergic rhinitis    Anxiety    Arthritis    knees and neck   Cataracts, bilateral    immature    CHF (congestive heart failure) ([place]) 2014   after surgery   Complication of anesthesia    had heart failure after knee surgery 02/04/13   DDD (degenerative disc disease), lumbar    Diabetes mellitus without complication (HCC)    Dizziness    takes Antivert prn   GERD (gastroesophageal reflux disease)    takes Aciphex prn   High cholesterol    but doesn't take any meds   History of colon polyps    History of migraine    last one 6-31months ago;takes Topamax nightly   Hypertension    takes Metoprolol and maxzide daily   Joint pain    Joint swelling    Migraine    Migraines    Pneumonia    at age 21   Seizures ([place])    hx of;last one 66yrs ago;no meds required;states it was brought on by med;only one ever had   Shortness of breath 02/06/2013   Sleep apnea    sleep study in epic from 2014-uses CPAP   Stroke ([place])    2 TIA   TIA (transient ischemic attack)     Past Surgical History:  Procedure Laterality  Date   ABDOMINAL HYSTERECTOMY  1991   ANTERIOR LAT LUMBAR FUSION N/A 11/21/2019   Procedure: ANTERIOR LATERAL LUMBAR FUSION (XLIF) with lateral plate Lumbar two through lumbar three;  Surgeon: Melina Schools, MD;  Location: [facility];  Service: Orthopedics;  Laterality: N/A;  3 hrs   carpel tunnel Right 1988   COLONOSCOPY     disc surgery  1996   ESOPHAGOGASTRODUODENOSCOPY     EYE SURGERY Bilateral    cataract surgery with lens implants   HERNIA REPAIR  1987   x 2   KNEE SURGERY Left 2011   replacement-partial   LEFT HEART CATH AND CORONARY ANGIOGRAPHY N/A 02/22/2021   Procedure: LEFT HEART CATH AND CORONARY ANGIOGRAPHY;  Surgeon: Nelva Bush, MD;  Location: [facility];  Service: Cardiovascular;  Laterality: N/A;   LUMBAR LAMINECTOMY/DECOMPRESSION MICRODISCECTOMY Right 07/13/2017   Procedure: Disectomy Right L2-3;  Surgeon: Melina Schools, MD;  Location: [facility];  Service: Orthopedics;  Laterality: Right;  2.5 hrs   ROBOTIC ASSISTED BILATERAL SALPINGO OOPHERECTOMY Bilateral 10/22/2013   Procedure: ROBOTIC ASSISTED BILATERAL SALPINGO OOPHORECTOMY;  Surgeon: Imagene Gurney A. Alycia Rossetti, MD;  Location: [facility];  Service: Gynecology;  Laterality: Bilateral;   ROTATOR CUFF REPAIR Right  TOTAL KNEE ARTHROPLASTY Right    TOTAL KNEE ARTHROPLASTY Right 02/04/2013   Procedure: RIGHT TOTAL KNEE ARTHROPLASTY;  Surgeon: Vickey Huger, MD;  Location: [facility];  Service: Orthopedics;  Laterality: Right;    Current Medications: Current Meds  Medication Sig   Accu-Chek Softclix Lancets lancets USE TO TEST EVERY DAY   acetaminophen (TYLENOL) 650 MG CR tablet Take 1,300 mg by mouth every 8 (eight) hours as needed for pain.   aspirin EC 81 MG tablet Take 81 mg by mouth daily.   cetirizine (ZYRTEC) 10 MG tablet Take 10 mg by mouth daily.    cholecalciferol (VITAMIN D3) 25 MCG (1000 UNIT) tablet Take 1,000 Units by mouth daily.   cyclobenzaprine (FLEXERIL) 10 MG tablet Take 10 mg by mouth 3 (three) times daily as  needed for muscle spasms.   empagliflozin (JARDIANCE) 10 MG TABS tablet Take 1 tablet (10 mg total) by mouth daily before breakfast.   Evolocumab (REPATHA SURECLICK) 528 MG/ML SOAJ INJECT 1 PEN INTO THE SKIN EVERY 14 DAYS   fluticasone (FLONASE) 50 MCG/ACT nasal spray Place 1-2 sprays into both nostrils at bedtime. As needed   glucose blood (ACCU-CHEK AVIVA PLUS) test strip TEST EVERY DAY AS DIRECTED   lidocaine (LIDODERM) 5 % Place 1 patch onto the skin daily as needed (pain).   LORazepam (ATIVAN) 0.5 MG tablet Take 0.25-0.5 mg by mouth daily as needed for anxiety.   MegaRed Omega-3 Krill Oil 500 MG CAPS Take 500 mg by mouth daily.   meperidine (DEMEROL) 50 MG tablet Take 50 mg by mouth every 6 (six) hours as needed for severe pain.   metoprolol succinate (TOPROL-XL) 50 MG 24 hr tablet Take 50 mg by mouth daily. Take with or immediately following a meal.   nitroGLYCERIN (NITROSTAT) 0.4 MG SL tablet Place 1 tablet (0.4 mg total) under the tongue every 5 (five) minutes as needed for chest pain. Take up to 3 tablets   RABEprazole (ACIPHEX) 20 MG tablet Take 20 mg by mouth every other day.   Semaglutide,0.25 or 0.5MG /DOS, (OZEMPIC, 0.25 OR 0.5 MG/DOSE,) 2 MG/1.5ML SOPN Inject 0.25 mg into the skin every Sunday.   triamterene-hydrochlorothiazide (MAXZIDE) 75-50 MG tablet Take 0.5 tablets by mouth daily. Pt take one half tablet daily     Allergies:   Codeine, Statins, Ambien [zolpidem], Bempedoic acid, Budesonide, Crestor [rosuvastatin], Demerol [meperidine hcl], Edarbi [azilsartan], Fexofenadine, Olmesartan, Sulfamethoxazole-trimethoprim, Valsartan-hydrochlorothiazide, Welchol [colesevelam], Adhesive [tape], Latex, and Tapentadol   Social History   Socioeconomic History   Marital status: Married    Spouse name: Not on file   Number of children: 2   Years of education: college   Highest education level: Not on file  Occupational History    Employer: [employer]  Tobacco Use    Smoking status: Never   Smokeless tobacco: Never  Vaping Use   Vaping Use: Never used  Substance and Sexual Activity   Alcohol use: No    Alcohol/week: 0.0 standard drinks   Drug use: No   Sexual activity: Yes    Birth control/protection: Surgical  Other Topics Concern   Not on file  Social History Narrative   ** Merged History Encounter **       Lives with husband Caffeine use:   Social Determinants of Health   Financial Resource Strain: Not on file  Food Insecurity: Not on file  Transportation Needs: Not on file  Physical Activity: Not on file  Stress: Not on file  Social Connections: Not on file  Family History: The patient's family history includes Bone cancer in her maternal grandfather; Colon cancer in her maternal grandfather; Hypertension in her mother; Pancreatic cancer in her mother; Prostate cancer in an other family member; Sleep apnea in her brother, maternal uncle, and mother. There is no history of Stomach cancer or Esophageal cancer.  ROS:   Please see the history of present illness.    Worried about the numbers of medications that she is on.  Otherwise no complaints.  Losing weight.  All other systems reviewed and are negative.  EKGs/Labs/Other Studies Reviewed:    The following studies were reviewed today: Coronary angiography performed in October 2022: Diagnostic Dominance: Right Intervention Flowsheet Row Most Recent Value  AO Systolic Pressure 812 mmHg  AO Diastolic Pressure 72 mmHg  AO Mean 98 mmHg  LV Systolic Pressure 751 mmHg  LV Diastolic Pressure 8 mmHg  LV EDP 24 mmHg  AOp Systolic Pressure 700 mmHg  AOp Diastolic Pressure 78 mmHg  AOp Mean Pressure 174 mmHg  LVp Systolic Pressure 944 mmHg  LVp Diastolic Pressure 13 mmHg  LVp EDP Pressure 17 mmHg   EKG:  EKG not repeated  Recent Labs: 08/24/2020: ALT 35 02/19/2021: BUN 16; Creatinine, Ser 1.28; Hemoglobin 14.3; Platelets 293; Potassium 4.5; Sodium 143  Recent Lipid Panel     Component Value Date/Time   CHOL 146 08/24/2020 0919   TRIG 109 08/24/2020 0919   HDL 54 08/24/2020 0919   CHOLHDL 2.7 08/24/2020 0919   CHOLHDL 10.6 02/07/2013 0650   VLDL 24 02/07/2013 0650   LDLCALC 72 08/24/2020 0919    Physical Exam:    VS:  BP 110/70    Pulse 76    Ht 5\' 4"  (1.626 m)    Wt 214 lb 6.4 oz (97.3 kg)    SpO2 98%    BMI 36.80 kg/m     Wt Readings from Last 3 Encounters:  06/16/21 214 lb 6.4 oz (97.3 kg)  04/28/21 219 lb (99.3 kg)  04/22/21 219 lb (99.3 kg)     GEN: Significant weight loss on semaglutide. No acute distress HEENT: Normal NECK: No JVD. LYMPHATICS: No lymphadenopathy CARDIAC: No murmur. RRR no gallop, or edema. VASCULAR:  Normal Pulses. No bruits. RESPIRATORY:  Clear to auscultation without rales, wheezing or rhonchi  ABDOMEN: Soft, non-tender, non-distended, No pulsatile mass, MUSCULOSKELETAL: No deformity  SKIN: Warm and dry NEUROLOGIC:  Alert and oriented x 3 PSYCHIATRIC:  Normal affect   ASSESSMENT:    1. Coronary artery disease involving native coronary artery of native heart without angina pectoris   2. Chronic diastolic CHF (congestive heart failure) ([place])   3. Essential hypertension   4. Other hyperlipidemia   5. History of TIA (transient ischemic attack)   6. Type 2 diabetes mellitus with complication, without long-term current use of insulin (HCC)    PLAN:    In order of problems listed above:  Continue secondary prevention Continue Jardiance 10 mg/day.  Weight loss and exercise recommended Excellent blood pressure control Continue PCSK9 therapy Continue risk reduction Now on semaglutide and Jardiance.   Medication Adjustments/Labs and Tests Ordered: Current medicines are reviewed at length with the patient today.  Concerns regarding medicines are outlined above.  No orders of the defined types were placed in this encounter.  No orders of the defined types were placed in this encounter.   Patient Instructions   Medication Instructions:  Your physician recommends that you continue on your current medications as directed. Please refer to the Current  Medication list given to you today.  *If you need a refill on your cardiac medications before your next appointment, please call your pharmacy*   Lab Work: NONE If you have labs (blood work) drawn today and your tests are completely normal, you will receive your results only by: [place] (if you have MyChart) OR A paper copy in the mail If you have any lab test that is abnormal or we need to change your treatment, we will call you to review the results.   Testing/Procedures: NONE   Follow-Up: At [facility], you and your health needs are our priority.  As part of our continuing mission to provide you with exceptional heart care, we have created designated Provider Care Teams.  These Care Teams include your primary Cardiologist (physician) and Advanced Practice Providers (APPs -  Physician Assistants and Nurse Practitioners) who all work together to provide you with the care you need, when you need it.  We recommend signing up for the patient portal called "MyChart".  Sign up information is provided on this After Visit Summary.  MyChart is used to connect with patients for Virtual Visits (Telemedicine).  Patients are able to view lab/test results, encounter notes, upcoming appointments, etc.  Non-urgent messages can be sent to your provider as well.   To learn more about what you can do with MyChart, go to [email or web address].    Your next appointment:   6-9 month(s)  The format for your next appointment:   In Person  Provider:   Sinclair Grooms, MD      Signed, Sinclair Grooms, MD  06/16/2021 4:48 PM    [place]

## 2021-06-16 ENCOUNTER — Other Ambulatory Visit: Payer: Self-pay

## 2021-06-16 ENCOUNTER — Encounter: Payer: Self-pay | Admitting: Interventional Cardiology

## 2021-06-16 ENCOUNTER — Ambulatory Visit: Payer: Medicare PPO | Admitting: Interventional Cardiology

## 2021-06-16 VITALS — BP 110/70 | HR 76 | Ht 64.0 in | Wt 214.4 lb

## 2021-06-16 DIAGNOSIS — I10 Essential (primary) hypertension: Secondary | ICD-10-CM | POA: Diagnosis not present

## 2021-06-16 DIAGNOSIS — I5032 Chronic diastolic (congestive) heart failure: Secondary | ICD-10-CM | POA: Diagnosis not present

## 2021-06-16 DIAGNOSIS — E118 Type 2 diabetes mellitus with unspecified complications: Secondary | ICD-10-CM | POA: Diagnosis not present

## 2021-06-16 DIAGNOSIS — E7849 Other hyperlipidemia: Secondary | ICD-10-CM | POA: Diagnosis not present

## 2021-06-16 DIAGNOSIS — Z8673 Personal history of transient ischemic attack (TIA), and cerebral infarction without residual deficits: Secondary | ICD-10-CM | POA: Diagnosis not present

## 2021-06-16 DIAGNOSIS — I2510 Atherosclerotic heart disease of native coronary artery without angina pectoris: Secondary | ICD-10-CM

## 2021-06-16 NOTE — Patient Instructions (Signed)
Medication Instructions:  Your physician recommends that you continue on your current medications as directed. Please refer to the Current Medication list given to you today.  *If you need a refill on your cardiac medications before your next appointment, please call your pharmacy*   Lab Work: NONE If you have labs (blood work) drawn today and your tests are completely normal, you will receive your results only by: [place] (if you have MyChart) OR A paper copy in the mail If you have any lab test that is abnormal or we need to change your treatment, we will call you to review the results.   Testing/Procedures: NONE   Follow-Up: At [facility], you and your health needs are our priority.  As part of our continuing mission to provide you with exceptional heart care, we have created designated Provider Care Teams.  These Care Teams include your primary Cardiologist (physician) and Advanced Practice Providers (APPs -  Physician Assistants and Nurse Practitioners) who all work together to provide you with the care you need, when you need it.  We recommend signing up for the patient portal called "MyChart".  Sign up information is provided on this After Visit Summary.  MyChart is used to connect with patients for Virtual Visits (Telemedicine).  Patients are able to view lab/test results, encounter notes, upcoming appointments, etc.  Non-urgent messages can be sent to your provider as well.   To learn more about what you can do with MyChart, go to [email or web address].    Your next appointment:   6-9 month(s)  The format for your next appointment:   In Person  Provider:   Sinclair Grooms, MD

## 2021-06-17 ENCOUNTER — Other Ambulatory Visit: Payer: Self-pay | Admitting: Interventional Cardiology

## 2021-06-30 DIAGNOSIS — G43909 Migraine, unspecified, not intractable, without status migrainosus: Secondary | ICD-10-CM | POA: Diagnosis not present

## 2021-06-30 DIAGNOSIS — I10 Essential (primary) hypertension: Secondary | ICD-10-CM | POA: Diagnosis not present

## 2021-06-30 DIAGNOSIS — E538 Deficiency of other specified B group vitamins: Secondary | ICD-10-CM | POA: Diagnosis not present

## 2021-06-30 DIAGNOSIS — E559 Vitamin D deficiency, unspecified: Secondary | ICD-10-CM | POA: Diagnosis not present

## 2021-06-30 DIAGNOSIS — E785 Hyperlipidemia, unspecified: Secondary | ICD-10-CM | POA: Diagnosis not present

## 2021-06-30 DIAGNOSIS — K219 Gastro-esophageal reflux disease without esophagitis: Secondary | ICD-10-CM | POA: Diagnosis not present

## 2021-06-30 DIAGNOSIS — E1169 Type 2 diabetes mellitus with other specified complication: Secondary | ICD-10-CM | POA: Diagnosis not present

## 2021-06-30 DIAGNOSIS — R21 Rash and other nonspecific skin eruption: Secondary | ICD-10-CM | POA: Diagnosis not present

## 2021-06-30 DIAGNOSIS — Z7984 Long term (current) use of oral hypoglycemic drugs: Secondary | ICD-10-CM | POA: Diagnosis not present

## 2021-07-06 ENCOUNTER — Encounter: Payer: Self-pay | Admitting: Internal Medicine

## 2021-07-06 ENCOUNTER — Ambulatory Visit: Payer: Medicare PPO | Admitting: Internal Medicine

## 2021-07-06 VITALS — BP 114/78 | HR 80 | Ht 64.0 in | Wt 212.4 lb

## 2021-07-06 DIAGNOSIS — R1310 Dysphagia, unspecified: Secondary | ICD-10-CM

## 2021-07-06 DIAGNOSIS — R0989 Other specified symptoms and signs involving the circulatory and respiratory systems: Secondary | ICD-10-CM | POA: Diagnosis not present

## 2021-07-06 DIAGNOSIS — K219 Gastro-esophageal reflux disease without esophagitis: Secondary | ICD-10-CM

## 2021-07-06 MED ORDER — AMBULATORY NON FORMULARY MEDICATION: 1 refills | Status: DC

## 2021-07-06 NOTE — Patient Instructions (Signed)
We have sent the following medications to your pharmacy for you to pick up at your convenience: ?GI Cocktail - 10 ml every 4-6 hours as needed for nausea. ? ?If you are age 72 or older, your body mass index should be between 23-30. Your Body mass index is 36.45 kg/m?[name] If this is out of the aforementioned range listed, please consider follow up with your Primary Care Provider. ?________________________________________________________ ? ?The [facility] providers would like to encourage you to use [facility] to communicate with providers for non-urgent requests or questions.  Due to long hold times on the telephone, sending your provider a message by [facility] may be a faster and more efficient way to get a response.  Please allow 48 business hours for a response.  Please remember that this is for non-urgent requests.  ?_______________________________________________________ ? ?

## 2021-07-06 NOTE — Progress Notes (Signed)
? ?Chief Complaint: GERD ? ?HPI : 71 year old female with history of GERD, DM, migraines, HFpEF, CVA, OSA, CAD presents for follow-up of GERD ? ?Interval History: Patient states that her symptoms have improved after her EGD.  She does feel like her dysphagia has improved after her dilation.  She is only having minimal sensations of globus at this time.  She has been taking daily Aciphex 30 min before supper. The change in the timing of when she takes her PPI has has been helped with her reflux symptoms. She is still having a little bit of regurgitation when she does not take her PPI at the optimal time. She had really bad migraines last week, which resulted in some regurgitation.  However sometimes she will have some nausea when she is having migraines so her regurgitation during her migraines may be due to migraine related side effects. ? ?Wt Readings from Last 3 Encounters:  ?07/06/21 212 lb 6 oz (96.3 kg)  ?06/16/21 214 lb 6.4 oz (97.3 kg)  ?04/28/21 219 lb (99.3 kg)  ? ? ?Current Outpatient Medications  ?Medication Sig Dispense Refill  ? Accu-Chek Softclix Lancets lancets USE TO TEST EVERY DAY    ? acetaminophen (TYLENOL) 650 MG CR tablet Take 1,300 mg by mouth every 8 (eight) hours as needed for pain.    ? aspirin EC 81 MG tablet Take 81 mg by mouth daily.    ? cetirizine (ZYRTEC) 10 MG tablet Take 10 mg by mouth daily.     ? cholecalciferol (VITAMIN D3) 25 MCG (1000 UNIT) tablet Take 1,000 Units by mouth daily.    ? cyclobenzaprine (FLEXERIL) 10 MG tablet Take 10 mg by mouth 3 (three) times daily as needed for muscle spasms.    ? desonide (DESOWEN) 0.05 % cream Apply 1 application. topically as needed.    ? empagliflozin (JARDIANCE) 10 MG TABS tablet Take 1 tablet (10 mg total) by mouth daily before breakfast. 90 tablet 3  ? Evolocumab (REPATHA SURECLICK) 062 MG/ML SOAJ INJECT 1 PEN INTO THE SKIN EVERY 14 DAYS 6 mL 3  ? ezetimibe (ZETIA) 10 MG tablet TAKE 1 TABLET(10 MG) BY MOUTH DAILY 90 tablet 3  ?  fluticasone (FLONASE) 50 MCG/ACT nasal spray Place 1-2 sprays into both nostrils at bedtime. As needed    ? glucose blood (ACCU-CHEK AVIVA PLUS) test strip TEST EVERY DAY AS DIRECTED    ? lidocaine (LIDODERM) 5 % Place 1 patch onto the skin daily as needed (pain).    ? LORazepam (ATIVAN) 0.5 MG tablet Take 0.25-0.5 mg by mouth daily as needed for anxiety.    ? meperidine (DEMEROL) 50 MG tablet Take 50 mg by mouth every 6 (six) hours as needed for severe pain.    ? metoprolol succinate (TOPROL-XL) 50 MG 24 hr tablet Take 50 mg by mouth daily. Take with or immediately following a meal.    ? Omega-3 Fatty Acids (FISH OIL PO) Take 1 capsule by mouth daily.    ? RABEprazole (ACIPHEX) 20 MG tablet Take 20 mg by mouth every other day.    ? Semaglutide,0.25 or 0.'5MG'$ /DOS, (OZEMPIC, 0.25 OR 0.5 MG/DOSE,) 2 MG/1.5ML SOPN Inject 0.25 mg into the skin every Sunday.    ? triamterene-hydrochlorothiazide (MAXZIDE) 75-50 MG tablet Take 0.5 tablets by mouth daily. Pt take one half tablet daily  1  ? nitroGLYCERIN (NITROSTAT) 0.4 MG SL tablet Place 1 tablet (0.4 mg total) under the tongue every 5 (five) minutes as needed for chest pain. Take  up to 3 tablets (Patient not taking: Reported on 07/06/2021) 25 tablet 2  ? ?No current facility-administered medications for this visit.  ? ?Review of Systems: ?All systems reviewed and negative except where noted in HPI.  ? ?Physical Exam: ?BP 114/78 (BP Location: Left Arm, Patient Position: Sitting, Cuff Size: Normal)   Pulse 80   Ht '5\' 4"'$  (1.626 m)   Wt 212 lb 6 oz (96.3 kg)   BMI 36.45 kg/m?  ?Constitutional: Pleasant,well-developed, female in no acute distress. ?HEENT: Normocephalic and atraumatic. Conjunctivae are normal. No scleral icterus. ?Cardiovascular: Normal rate, regular rhythm.  ?Pulmonary/chest: Effort normal and breath sounds normal. No wheezing, rales or rhonchi. ?Abdominal: Soft, nondistended, nontender. Bowel sounds active throughout. There are no masses palpable. No  hepatomegaly. ?Extremities: No edema ?Neurological: Alert and oriented to person place and time. ?Skin: Skin is warm and dry. No rashes noted. ?Psychiatric: Normal mood and affect. Behavior is normal. ? ?Labs 07/2020: LFTs with mildly elevated ALT of 35, other LFTs nml. ? ?Labs 01/2021: CBC nml, BMP with mildly elevated Cr of 1.28, mildly elevated Ca of 10.4 ? ?Barium swallow 05/04/21: ?IMPRESSION: ?Minimal esophageal dysmotility, likely presbyesophagus. Otherwise, ?normal esophagram. ? ?Colonoscopy 05/30/01: Multiple polyps sigmoid colon to rectum, 4-7 mm.  ?  ?Colonoscopy 10/06/14: Few right sided diverticulosis. Excellent prep. Repeat colonoscopy in 10 years. ?  ?Colonoscopy 06/09/17: Normal colonoscopy. Excellent prep. Repeat colonoscopy in 5-10 years. ? ?EGD 04/28/21: ?- Benign-appearing esophageal stenoses. Dilated. ?- Non-obstructing Schatzki ring. Biopsied. ?- Gastritis. Biopsied. ?- Normal examined duodenum. Biopsied. ?- Biopsies were taken with a cold forceps for histology in the proximal esophagus and in the distal esophagus. ?Path: ?1. Surgical [P], duodenum ?- BENIGN DUODENAL MUCOSA ?- NO ACUTE INFLAMMATION, VILLOUS BLUNTING OR INCREASED INTRAEPITHELIAL LYMPHOCYTES IDENTIFIED ?2. Surgical [P], gastric antrum and gastric body ?- REACTIVE GASTROPATHY ?- NO H. PYLORI OR INTESTINAL METAPLASIA IDENTIFIED ?- SEE COMMENT ?3. Surgical [P], distal esophagus ?- BENIGN SQUAMOUS MUCOSA ?- NO INCREASED INTRAEPITHELIAL EOSINOPHILS ?4. Surgical [P], proximal esophagus ?- BENIGN SQUAMOUS MUCOSA ?- NO INCREASED INTRAEPITHELIAL EOSINOPHILS ? ?ASSESSMENT AND PLAN: ? ?GERD ?Dysphagia ?Globus ?Colon cancer screening ?Patient has been doing well from a reflux standpoint.  The optimization of timing of taking her PPI has significantly helped with reflux control.  She does occasionally have breakthrough symptoms so we will give her some GI cocktail to use as needed. Her dysphagia improved after esophageal dilation and disruption of  the Schatzki's ring during her last EGD.  Globus sensation is minimal at this time. ?- Continue rabeprazole 20 mg QD, take 30 min before a meal ?- Will give GI cocktail to use PRN ?- Can discuss in 06/2022 whether or not to repeat a colonoscopy at that time for colon cancer screening ?- RTC 6 months ? ?[name], MD ? ?

## 2021-07-12 ENCOUNTER — Telehealth: Payer: Self-pay | Admitting: Internal Medicine

## 2021-07-12 NOTE — Telephone Encounter (Signed)
Inbound call from patient stating she did not receive script for GI cocktail.  Please advise. ?

## 2021-07-14 MED ORDER — AMBULATORY NON FORMULARY MEDICATION
1 refills | Status: AC
Start: 2021-07-14 — End: ?

## 2021-07-14 NOTE — Telephone Encounter (Signed)
Refaxed script to patient's pharmacy. Patient informed. ?

## 2021-07-19 ENCOUNTER — Other Ambulatory Visit: Payer: Medicare PPO

## 2021-07-26 ENCOUNTER — Other Ambulatory Visit: Payer: Medicare PPO

## 2021-07-26 ENCOUNTER — Ambulatory Visit
Admission: RE | Admit: 2021-07-26 | Discharge: 2021-07-26 | Disposition: A | Discharge status: Home / Self Care | Payer: Medicare PPO | Source: Ambulatory Visit | Attending: Internal Medicine | Admitting: Internal Medicine

## 2021-07-26 ENCOUNTER — Telehealth: Payer: Self-pay | Admitting: Interventional Cardiology

## 2021-07-26 DIAGNOSIS — E042 Nontoxic multinodular goiter: Secondary | ICD-10-CM | POA: Diagnosis not present

## 2021-07-26 DIAGNOSIS — E041 Nontoxic single thyroid nodule: Secondary | ICD-10-CM

## 2021-07-26 NOTE — Telephone Encounter (Signed)
Pt c/o medication issue: ? ?1. Name of Medication:  ?Ibuprofen  ? ?2. How are you currently taking this medication (dosage and times per day)?  ? ?3. Are you having a reaction (difficulty breathing--STAT)?  ? ?4. What is your medication issue?  ? ?Patient states her left shoulder has been injured due to injury she had last year. She states she normally experiences shoulder pain due to this, but yesterday it became unbearable, to the point where she was in tears. She denies chest pain, but states the pain radiates into her back. She would like to know if it would  be alright to take Ibuprofen for the pain. Please advise. ? ?

## 2021-07-26 NOTE — Telephone Encounter (Signed)
Spoke with pt and made her aware ok to use Ibuprofen to help with this pain.  Pt appreciative for call.   ?

## 2021-07-26 NOTE — Telephone Encounter (Signed)
Okay to use Ibuprofen. ?

## 2021-08-03 DIAGNOSIS — I10 Essential (primary) hypertension: Secondary | ICD-10-CM | POA: Diagnosis not present

## 2021-08-03 DIAGNOSIS — E119 Type 2 diabetes mellitus without complications: Secondary | ICD-10-CM | POA: Diagnosis not present

## 2021-08-03 DIAGNOSIS — E041 Nontoxic single thyroid nodule: Secondary | ICD-10-CM | POA: Diagnosis not present

## 2021-08-06 DIAGNOSIS — E782 Mixed hyperlipidemia: Secondary | ICD-10-CM | POA: Diagnosis not present

## 2021-08-06 DIAGNOSIS — E041 Nontoxic single thyroid nodule: Secondary | ICD-10-CM | POA: Diagnosis not present

## 2021-08-06 DIAGNOSIS — E119 Type 2 diabetes mellitus without complications: Secondary | ICD-10-CM | POA: Diagnosis not present

## 2021-08-06 DIAGNOSIS — Z6837 Body mass index (BMI) 37.0-37.9, adult: Secondary | ICD-10-CM | POA: Diagnosis not present

## 2021-08-06 DIAGNOSIS — N183 Chronic kidney disease, stage 3 unspecified: Secondary | ICD-10-CM | POA: Diagnosis not present

## 2021-08-06 DIAGNOSIS — I10 Essential (primary) hypertension: Secondary | ICD-10-CM | POA: Diagnosis not present

## 2021-09-24 ENCOUNTER — Ambulatory Visit
Admission: RE | Admit: 2021-09-24 | Discharge: 2021-09-24 | Disposition: A | Discharge status: Home / Self Care | Payer: Medicare PPO | Source: Ambulatory Visit | Attending: Family Medicine | Admitting: Family Medicine

## 2021-09-24 DIAGNOSIS — Z780 Asymptomatic menopausal state: Secondary | ICD-10-CM | POA: Diagnosis not present

## 2021-09-24 DIAGNOSIS — E2839 Other primary ovarian failure: Secondary | ICD-10-CM

## 2021-10-11 ENCOUNTER — Ambulatory Visit: Payer: Medicare PPO | Admitting: Neurology

## 2021-10-18 ENCOUNTER — Ambulatory Visit: Payer: Medicare PPO | Admitting: Neurology

## 2021-11-11 DIAGNOSIS — F5101 Primary insomnia: Secondary | ICD-10-CM | POA: Diagnosis not present

## 2021-11-11 DIAGNOSIS — G43909 Migraine, unspecified, not intractable, without status migrainosus: Secondary | ICD-10-CM | POA: Diagnosis not present

## 2021-11-11 DIAGNOSIS — E1169 Type 2 diabetes mellitus with other specified complication: Secondary | ICD-10-CM | POA: Diagnosis not present

## 2021-11-11 DIAGNOSIS — E785 Hyperlipidemia, unspecified: Secondary | ICD-10-CM | POA: Diagnosis not present

## 2021-11-11 DIAGNOSIS — M25561 Pain in right knee: Secondary | ICD-10-CM | POA: Diagnosis not present

## 2021-11-11 DIAGNOSIS — I10 Essential (primary) hypertension: Secondary | ICD-10-CM | POA: Diagnosis not present

## 2021-11-23 ENCOUNTER — Other Ambulatory Visit: Payer: Self-pay | Admitting: Pharmacist

## 2021-11-23 MED ORDER — REPATHA SURECLICK 140 MG/ML ~~LOC~~ SOAJ: SUBCUTANEOUS | 11 refills | Status: DC

## 2021-12-13 ENCOUNTER — Encounter: Payer: Self-pay | Admitting: Neurology

## 2021-12-13 ENCOUNTER — Ambulatory Visit: Payer: Medicare PPO | Admitting: Neurology

## 2021-12-13 VITALS — BP 121/80 | HR 87 | Ht 64.0 in | Wt 200.0 lb

## 2021-12-13 DIAGNOSIS — G43009 Migraine without aura, not intractable, without status migrainosus: Secondary | ICD-10-CM | POA: Diagnosis not present

## 2021-12-13 DIAGNOSIS — Z9989 Dependence on other enabling machines and devices: Secondary | ICD-10-CM

## 2021-12-13 DIAGNOSIS — G250 Essential tremor: Secondary | ICD-10-CM | POA: Diagnosis not present

## 2021-12-13 DIAGNOSIS — G4733 Obstructive sleep apnea (adult) (pediatric): Secondary | ICD-10-CM | POA: Diagnosis not present

## 2021-12-13 NOTE — Patient Instructions (Signed)
Essential Tremor ?A tremor is trembling or shaking that a person cannot control. Most tremors affect the hands or arms. Tremors can also affect the head, vocal cords, legs, and other parts of the body. Essential tremor is a tremor without a known cause. Usually, it occurs while a person is trying to perform an action. It tends to get worse gradually as a person ages. ?What are the causes? ?The cause of this condition is not known, but it often runs in families. ?What increases the risk? ?You are more likely to develop this condition if: ?You have a family member with essential tremor. ?You are 40 years of age or older. ?What are the signs or symptoms? ?The main sign of a tremor is a rhythmic shaking of certain parts of your body that is uncontrolled and unintentional. You may: ?Have difficulty eating with a spoon or fork. ?Have difficulty writing. ?Nod your head up and down or side to side. ?Have a quivering voice. ?The shaking may: ?Get worse over time. ?Come and go. ?Be more noticeable on one side of your body. ?Get worse due to stress, tiredness (fatigue), caffeine, and extreme heat or cold. ?How is this diagnosed? ?This condition may be diagnosed based on: ?Your symptoms and medical history. ?A physical exam. ?There is no single test to diagnose an essential tremor. However, your health care provider may order tests to rule out other causes of your condition. These may include: ?Blood and urine tests. ?Imaging studies of your brain, such as a CT scan or MRI. ?How is this treated? ?Treatment for essential tremor depends on the severity of the condition. ?Mild tremors may not need treatment if they do not affect your day-to-day life. ?Severe tremors may need to be treated using one or more of the following options: ?Medicines. ?Injections of a substance called botulinum toxin. ?Procedures such as deep brain stimulation (DBS) implantation or MRI-guided ultrasound treatment. ?Lifestyle changes. ?Occupational or  physical therapy. ?Follow these instructions at home: ?Lifestyle ? ?Do not use any products that contain nicotine or tobacco. These products include cigarettes, chewing tobacco, and vaping devices, such as e-cigarettes. If you need help quitting, ask your health care provider. ?Limit your caffeine intake as told by your health care provider. ?Try to get 8 hours of sleep each night. ?Find ways to manage your stress that fit your lifestyle and personality. Consider trying meditation or yoga. ?Try to anticipate stressful situations and allow extra time to manage them. ?If you are struggling emotionally with the effects of your tremor, consider working with a mental health provider. ?General instructions ?Take over-the-counter and prescription medicines only as told by your health care provider. ?Avoid extreme heat and extreme cold. ?Keep all follow-up visits. This is important. Visits may include physical therapy visits. ?Where to find more information ?[employer] and Stroke: www.ninds.nih.gov ?Contact a health care provider if: ?You experience any changes in the location or intensity of your tremors. ?You start having a tremor after starting a new medicine. ?You have a tremor with other symptoms, such as: ?Numbness. ?Tingling. ?Pain. ?Weakness. ?Your tremor gets worse. ?Your tremor interferes with your daily life. ?You feel down, blue, or sad for at least 2 weeks in a row. ?Worrying about your tremor and what other people think about you interferes with your everyday life functions, including relationships, work, or school. ?Summary ?Essential tremor is a tremor without a known cause. Usually, it occurs when you are trying to perform an action. ?You are more likely   to develop this condition if you have a family member with essential tremor. ?The main sign of a tremor is a rhythmic shaking of certain parts of your body that is uncontrolled and unintentional. ?Treatment for essential  tremor depends on the severity of the condition. ?This information is not intended to replace advice given to you by your health care provider. Make sure you discuss any questions you have with your health care provider. ?Document Revised: 02/05/2021 Document Reviewed: 02/05/2021 ?[employer] Patient Education ? 2023 [employer] ? ?

## 2021-12-13 NOTE — Progress Notes (Signed)
SLEEP MEDICINE CLINIC   Provider:  Larey Seat, M D  Referring Provider: Harlan Stains, MD Primary Care Physician:  Harlan Stains, MD  Chief Complaint  Patient presents with   Obstructive Sleep Apnea    Rm 11, alone. Here to f/u for OSA and on CPAP. Pt reports doing well. Reports no concerns today.   CPAP compliance visit.  Interval history - 12-13-2021:  Robyn Aguilar is an established sleep apnea patient 2014 - having seen her in 2004 for TIA and migraines, 2014 for a sleep study-  in our practice but would like today's visit on 12-13-2021 to be dedicated to her trauma work-up.  She had noticed the tremor for a while, about one year.  The patient is right-hand dominant and this is where her tremor manifests with action or intention, she is not aware of a tremor at rest. She lost weight , #196  from 228 pounds in 2021. She carries a diagnosis of DM since 2019 now on Ozempic.  Has GERD, DDD,  3-14 -2019 dr Rolena Infante- shoulder pain, sinusitis, She is status post  bilateral total knee replacement  2014  dr Ronnie Derby-, she had fluid overload- heart failure after surgery!  Robyn Aguilar also has remained highly compliant CPAP user her machine was used 30 out of 30 days each day over 4 hours with an average of 8 hours 53 minutes.  This is an AutoSet with a minimum pressure setting of 5, maximum pressure setting of 15 cm water 3 cm EPR, 95th percentile pressure 9.5 cm water.  Air leak is low at 6 L at the 95th percentile.  Residual events AHI of 1.1/h.  This download does not state when the machine was first set up.  I was able to do to draw a traumagram with the patient who had no tremor ataxia or dysmetria on finger-nose test.  She does not have cogwheel rigidity in either biceps and no increased tone at the wrist.  There is a tremor noted and visible on tremagram.     03/31/21 MM- : Robyn Aguilar is a 71 year old female with a history of obstructive sleep apnea on  CPAP.  She returns today for follow-up.  Her download is attached to this note.  Reports that CPAP is working well for her.  She denies any issues.  Patient does note that she has a tremor in the upper extremities right greater than left.  It tends to be more pronounced in the morning but gets better as the day goes on.  She notices it with her handwriting.  When she is at rest she does not pay much attention to the tremor.  She reports that her grandfather did have a diagnosis of Parkinson's disease.  She returns today for an evaluation.    03/31/20: MM Robyn Aguilar is a 71 year old female with a history of obstructive sleep apnea on CPAP.  Her download indicates that she use her machine nightly for compliance of 100%.  She use her machine greater than 4 hours each night.  On average she uses her machine 8 hours and 33 minutes.  Her residual AHI is 0.9 on 5 to 15 cm of water with EPR 3.  Leak in the 95th percentile is 6.2 L/min.  Reports that the CPAP is working well for her.  She returns today for follow-up.  HISTORY 03/27/19:   Robyn Aguilar is a 71 year old female with a history of obstructive sleep apnea on CPAP.  She returns  today for follow-up.  Her download indicates that she use her machine nightly for compliance of 100%.  She use her machine greater than 4 hours 29 days for compliance of 97%.  On average she uses her machine 7 hours and 52 minutes.  Her residual AHI is 0.7 on 5 to 15 cm of water with EPR of 3.  Her leak in the 95th percentile is 1.7 L/min.  She reports that the CPAP is working well for her.  She states that she also has migraine headaches.  Her PCP gives her Demerol to treat her headaches.  She reports that she has been on Topamax in the past but was unable to tolerate it.  She states that she has 2-3 migraines a week.  They typically occur in the left frontal region.  She does have photophobia and phonophobia as well as nausea.  Her headaches are typically  triggered by weather changes.  She states that this past Saturday and Sunday she had a different type of headache.  It was in the left occipital region and she describes it as a sharp stabbing pain that will last for 20 seconds and then she would have a break and then it may do it again.  With Demerol her symptoms finally resolved.  She returns today for an evaluation.    MM: Robyn Aguilar is a 71 year old female with a history of obstructive sleep apnea on CPAP.  She returns today for follow-up.  Her download is attached to this note.  Reports that CPAP is working well for her.  She denies any issues.  Patient does note that she has a tremor in the upper extremities right greater than left.  It tends to be more pronounced in the morning but gets better as the day goes on.  She notices it with her handwriting.  When she is at rest she does not pay much attention to the tremor.  She reports that her grandfather did have a diagnosis of Parkinson's disease.  She returns today for an evaluation.  03/31/20: Robyn Aguilar is a 71 year old female with a history of obstructive sleep apnea on CPAP.  Her download indicates that she use her machine nightly for compliance of 100%.  She use her machine greater than 4 hours each night.  On average she uses her machine 8 hours and 33 minutes.  Her residual AHI is 0.9 on 5 to 15 cm of water with EPR 3.  Leak in the 95th percentile is 6.2 L/min.  Reports that the CPAP is working well for her.  She returns today for follow-up.  HISTORY 03/27/19:   Robyn Aguilar is a 71 year old female with a history of obstructive sleep apnea on CPAP.  She returns today for follow-up.  Her download indicates that she use her machine nightly for compliance of 100%.  She use her machine greater than 4 hours 29 days for compliance of 97%.  On average she uses her machine 7 hours and 52 minutes.  Her residual AHI is 0.7 on 5 to 15 cm of water with EPR of 3.  Her leak in  the 95th percentile is 1.7 L/min.  She reports that the CPAP is working well for her.  She states that she also has migraine headaches.  Her PCP gives her Demerol to treat her headaches.  She reports that she has been on Topamax in the past but was unable to tolerate it.  She states that she has 2-3 migraines a week.  They typically occur in the left frontal region.  She does have photophobia and phonophobia as well as nausea.  Her headaches are typically triggered by weather changes.  She states that this past Saturday and 'Sunday she had a different type of headache.  It was in the left occipital region and she describes it as a sharp stabbing pain that will last for 20 seconds and then she would have a break and then it may do it again.  With Demerol her symptoms finally resolved.  She returns today for an evaluation.       Interval history from 22 March 2018.  I have the pleasure of meeting today with Robyn Aguilar who underwent a home sleep tablet on watch Pat on 19 December 2017 to confirm the presence of sleep apnea in order to get a new CPAP machine.  Her last sleep study was in April 2014 with an AHI of 17.3.  The sleep study resulted in an AHI of 30.1, RDI of 32.2/h.which indicates that she has now severe underlying sleep apnea associated with loud snoring. She did not produce any significant oxygen desaturations, her heart rate remained in sinus rhythm.  She was furnished with a an auto CPAP machine set between 5 and 15 cmH2O with 3 cm EPR her current 95th percentile pressure is 8.1 cm her residual AHI is 0.8, there are no central apneas emerging, she is 100% compliant by dates and time with an average user time of 7 hours 47 minutes and has minimal air leakage.  I do not need to change the entire face, the settings will remain the same and the patient is very happy with the CPAP and actually also travels always with her CPAP.  She feels less sleepy less fatigued and able to mentally  focus and participate in social activities that would otherwise be impaired by sleepiness and fatigue.    11-27-2017 ; Robyn Aguilar is an established Sleep Apnea Patient; 71 years of age and status post hysterectomy and lumbar fusion and bone spurr reducution. She has had her machine for over 5 years and has always been compliant. She needs a new machine and would like to switch DME- from [facility]. Her last titration study was in April 2014. AHI of 17.3/h and CPAPat 6 cm water.  Mallampati 5, neck 14.5 inches , BMI 35.1 , no change in sleep habits. Epworth 6/ 24. Retired school teacher.       20'$ 18; Robyn Aguilar is a 22 year old African-American lady but recently retired, and has been on CPAP after being diagnosed with obstructive sleep apnea at [facility] sleep. Her baseline AHI was 17.3 but exacerbated during REM sleep to 34.1 and in supine sleep her AHI was 26.7 she did not have significant oxygen desaturations, no significant periodic limb movements and she return for CPAP titration on 10/26/2012. She used CPAP at 6 cm water pressure, which is rather low. She continued to use her CPAP but had problems with the machine being broken, being sent off for repairs and still feels that this is not a reliable CPAP machine. She was told by [facility] [place] that her machine cannot be replaced as it is not 72 years old. She wasn't even given a loaner machine for the time that her CPAP was in repair. The patient has used the machine over the last 90 days 77% of the time for over 4 hours nightly. This time includes a period when she had shoulder  surgery and the period when she couldn't use her machine because it was broken. She also had knee replacement surgery. CPAP is still set at 6 cm water pressure with 2 cm EPR, the patient is an AHI of 1.1, her average usage is 6 hours and 9 minutes there is no need to adjust pressures. She purchased a clean machine . She loves her CPAP- she  sleeps better , she is rested and restored.     Robyn Aguilar is a 71 y.o. female , seen here as a referral from Dr. Dema Severin for a re- evaluation of sleep apnea.  [name] reports that she is not sleeping well and that the CPAP machine she is currently using seems to have nothing to do with it. She has not gotten new supplies through [facility] including the nasal pillow. She brought her machine with her today. I would like to "from the patient's sleep study dated 09/28/2012. At the time the patient was 71 years of age and underwent a baseline PSG which revealed an AHI of 17.3 during REM sleep the AHI was 34.1 and in supine sleep 26.7. The lowest oxygen desaturation was at 83% with only 4.7 minutes of desaturation time. There were no significant periodic limb movements noted. The patient kindly agreed to return for a CPAP titration on 10/26/2012 and at that time was titrated to 6 cm water she slept over now her at this pressure with an AHI of 0.0 and an oxygen nadir of 90%. She reports having compliantly used her machine. She noted air leaks at the nasal pillow.  She is followed by [facility] is her durable medical equipment company, we were able today to obtain a download from her machine but this encompasses the time of November into early December 2016. She has used the machine on average 7 hours and 13 minutes with an 83% compliance and a setting of 6 cm water with 2 cm expiratory pressure relief. Her residual AHI was 2.7. Based on these 106-monthold data she was doing very well with her machine and the machine controlled her apnea well. She has to see her provider today that she will need a prescription to get for the supplies,  but she also needs her CPAP to be evaluated and she needs a new date chip .   2014; She is now referred by Dr. WDema Severinafter she presented  With  migrainous headaches again. Dr. WDema Severinevaluated her in January 2013 for a cluster headache with  sinusitis and this winter of for her migraines.  A CT of the maxillofacial skull was negative for sinusitis.  Her labs were normal. The only remarkable finding was an elevated cholesterol level, a low vitamin D level she had a very slightly decreased level of potassium at 3.4 ,and a slight decrease in her glomerular filtration rate. The patient developed again severe migraines and was seen on 07/20/2012. She had reported falling after the migraine cord" hit her and her eyes became watery and she couldn't see. She is to be picked up from work. She had good relief of frequent migraines by an ascending doors of topiramate every evening finally reaching 100 mg at night. For breakthrough pain she can take a Demerol per Dr. WDema Severinprescribed and she has not needed many of those for the last 6 months. She had some residual pain after the day of her fall in the right eye. She stated today that she had no other migraine attacks since March.  She has photophobia, nausea and some dizziness with severe migraines. She reports today that her migraines have again left off she is also coincidentally at this time on prednisone which may have helped this and presumed allergic component to the migraine origin. Upon questioning her about side effects of topiramate may house the patient indicated that she has noted work finding difficulties. Tingling in the fingers was noted that was transient during the titration period I have asked her to see if she can get by the 75 mg at night instead of 100, and I will add 100 mg Neurontin at night to see if this will help her to sleep better at night  ,as she also complained of insomnia and restless movements.  Chief complaint according to patient : Her chief problem is the leakage of air at the nostril level. Sleep habits are as follows in 2014 :  After work she comes home , she sleeps in a recliner ( had rotator cuff surgery ) she has PT/ OT in afternoons.She goes to sleep at 9.30 PM and  goes to urinate at 2 AM, mostly returns to sleep quickly. Average sleep time over night is 6.5 hours.  She feels the air leaking on the side of her nostrils. Every time she will get a new nasal pillow she has an acute sense that the air leaks are diminished but once she has them for a  couple of weeks, the air leak returned. Leaves at 5.30 AM for work. 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  Social history: recent rotator cuff surgery, on meloxicam prn.    Review of Systems: Out of a complete 14 system review, the patient complains of only the following symptoms, and all other reviewed systems are negative.  How likely are you to doze in the following situations: 0 = not likely, 1 = slight chance, 2 = moderate chance, 3 = high chance  Sitting and Reading?1 Watching Television?0 Sitting inactive in a public place (theater or meeting)?0 Lying down in the afternoon when circumstances permit?3 Sitting and talking to someone?0 Sitting quietly after lunch without alcohol?1 In a car, while stopped for a few minutes in traffic?0 As a passenger in a car for an hour without a break?2  Total = today endorsed at 7 points.   Epworth Sleepiness score  was 10/ 24 points in 2021,5 points in 2022  Fatigue severity score was 22 / 63 points,  was 24 in the last visit . GDS 1/ 15 points   MM - FSS24 ESS5   not snoring on CPAP auto, 95% 8 cm water.    Social History   Socioeconomic History   Marital status: Married    Spouse name: Not on file   Number of children: 2   Years of education: college   Highest education level: Not on file  Occupational History    Employer: [employer]   Financial resource strain: Not on file   Food insecurity:    Worry: Not on file    Inability: Not on file   Transportation needs:    Medical: Not on file    Non-medical: Not on file  Tobacco Use   Smoking status: Never Smoker   Smokeless tobacco: Never Used   Substance and Sexual Activity   Alcohol use: No    Alcohol/week: 0.0 standard drinks Caffeine use    Drug use: None   Sexual activity: Yes    Birth control/protection:  Surgical  Lifestyle   Physical activity:    Days per week: Not on file    Minutes per session: Not on file      Relationships   Social connections:                                 Intimate partner violence:                      Other Topics Concern   Not on file  Social History Narrative   Lives with husband   Caffeine use:      Family History  Problem Relation Age of Onset   Hypertension Mother    Pancreatic cancer Mother    Sleep apnea Mother    Sleep apnea Brother    Colon cancer Maternal Grandfather    Bone cancer Maternal Grandfather    Sleep apnea Maternal Uncle    Prostate cancer Other    Stomach cancer Neg Hx    Esophageal cancer Neg Hx     Past Medical History:  Diagnosis Date   Allergic rhinitis    Anxiety    Arthritis    knees and neck   Cataracts, bilateral    immature    CHF (congestive heart failure) ([place]) 2014   after surgery   Complication of anesthesia    had heart failure after knee surgery 02/04/13   DDD (degenerative disc disease), lumbar    Diabetes mellitus without complication (HCC)    Dizziness    takes Antivert prn   GERD (gastroesophageal reflux disease)    takes Aciphex prn   High cholesterol    but doesn't take any meds   History of colon polyps    History of migraine    last one 6-40month ago;takes Topamax nightly   Hypertension    takes Metoprolol and maxzide daily   Joint pain    Joint swelling    Migraine    Migraines    Pneumonia    at age 67   [place] of;last one 34yrago;no meds required;states it was brought on by med;only one ever had   Shortness of breath 02/06/2013   Sleep apnea    sleep study in epic from 2014-uses CPAP   [place] ([facility]   2 TIA   TIA (transient ischemic attack)     Past Surgical History:   Procedure Laterality Date   ABDOMINAL HYSTERECTOMY  1991   ANTERIOR LAT LUMBAR FUSION N/A 11/21/2019   Procedure: ANTERIOR LATERAL LUMBAR FUSION (XLIF) with lateral plate Lumbar two through lumbar three;  Surgeon: BrMelina SchoolsMD;  [facility];  Laterality: N/A;  3 hrs   carpel tunnel Right 1988   COLONOSCOPY     disc surgery  1996   ESOPHAGOGASTRODUODENOSCOPY     EYE SURGERY Bilateral    cataract surgery with lens implants   HERNIA REPAIR  1987   x 2   KNEE SURGERY Left 2011   replacement-partial   LEFT HEART CATH AND CORONARY ANGIOGRAPHY N/A 02/22/2021   Procedure: LEFT HEART CATH AND CORONARY ANGIOGRAPHY;  Surgeon: EnNelva BushMD;  [facility];  Service: Cardiovascular;  Laterality: N/A;   LUMBAR LAMINECTOMY/DECOMPRESSION MICRODISCECTOMY Right 07/13/2017   Procedure: Disectomy Right L2-3;  Surgeon: BrMelina SchoolsMD;  [facility]: [facility];  Laterality: Right;  2.5 hrs  ROBOTIC ASSISTED BILATERAL SALPINGO OOPHERECTOMY Bilateral 10/22/2013   Procedure: ROBOTIC ASSISTED BILATERAL SALPINGO OOPHORECTOMY;  Surgeon: Imagene Gurney A. Alycia Rossetti, MD;  Location: [facility];  Service: Gynecology;  Laterality: Bilateral;   ROTATOR CUFF REPAIR Right    TOTAL KNEE ARTHROPLASTY Right    TOTAL KNEE ARTHROPLASTY Right 02/04/2013   Procedure: RIGHT TOTAL KNEE ARTHROPLASTY;  Surgeon: Vickey Huger, MD;  Location: [facility];  Service: Orthopedics;  Laterality: Right;    Current Outpatient Medications  Medication Sig Dispense Refill   Accu-Chek Softclix Lancets lancets USE TO TEST EVERY DAY     acetaminophen (TYLENOL) 650 MG CR tablet Take 1,300 mg by mouth every 8 (eight) hours as needed for pain.     AMBULATORY NON FORMULARY MEDICATION Medication Name: 90 ml viscous lidocaine:90 ml '10mg'$ /38m Dicyclomine:270 ml Maalox - 10 ml every 4-6 hours as needed. 450 mL 1   aspirin EC 81 MG tablet Take 81 mg by mouth daily.     cetirizine (ZYRTEC) 10 MG tablet Take  10 mg by mouth daily.      cholecalciferol (VITAMIN D3) 25 MCG (1000 UNIT) tablet Take 1,000 Units by mouth daily.     cyclobenzaprine (FLEXERIL) 10 MG tablet Take 10 mg by mouth 3 (three) times daily as needed for muscle spasms.     desonide (DESOWEN) 0.05 % cream Apply 1 application. topically as needed.     empagliflozin (JARDIANCE) 10 MG TABS tablet Take 1 tablet (10 mg total) by mouth daily before breakfast. 90 tablet 3   Evolocumab (REPATHA SURECLICK) 1620MG/ML SOAJ INJECT 1 PEN INTO THE SKIN EVERY 14 DAYS 2 mL 11   ezetimibe (ZETIA) 10 MG tablet TAKE 1 TABLET(10 MG) BY MOUTH DAILY 90 tablet 3   fluticasone (FLONASE) 50 MCG/ACT nasal spray Place 1-2 sprays into both nostrils at bedtime. As needed     glucose blood (ACCU-CHEK AVIVA PLUS) test strip TEST EVERY DAY AS DIRECTED     lidocaine (LIDODERM) 5 % Place 1 patch onto the skin daily as needed (pain).     LORazepam (ATIVAN) 0.5 MG tablet Take 0.25-0.5 mg by mouth daily as needed for anxiety.     meperidine (DEMEROL) 50 MG tablet Take 50 mg by mouth every 6 (six) hours as needed for severe pain.     metoprolol succinate (TOPROL-XL) 50 MG 24 hr tablet Take 50 mg by mouth daily. Take with or immediately following a meal.     nitroGLYCERIN (NITROSTAT) 0.4 MG SL tablet Place 1 tablet (0.4 mg total) under the tongue every 5 (five) minutes as needed for chest pain. Take up to 3 tablets 25 tablet 2   RABEprazole (ACIPHEX) 20 MG tablet Take 20 mg by mouth every other day.     Semaglutide,0.25 or 0.'5MG'$ /DOS, (OZEMPIC, 0.25 OR 0.5 MG/DOSE,) 2 MG/1.5ML SOPN Inject 0.25 mg into the skin every Sunday.     triamterene-hydrochlorothiazide (MAXZIDE) 75-50 MG tablet Take 0.5 tablets by mouth daily. Pt take one half tablet daily  1   No current facility-administered medications for this visit.    Allergies as of 12/13/2021 - Review Complete 12/13/2021  Allergen Reaction Noted   Codeine Hives 09/12/2012   Statins Other (See Comments) 06/26/2017   Ambien  [zolpidem] Other (See Comments) 08/03/2020   Bempedoic acid  01/08/2020   Budesonide  08/03/2020   Crestor [rosuvastatin]  08/03/2020   Demerol [meperidine hcl]  08/03/2020   Edarbi [azilsartan]  08/03/2020   Fexofenadine  08/03/2020   Olmesartan  08/03/2020   Sulfamethoxazole-trimethoprim  08/03/2020   Valsartan-hydrochlorothiazide  08/03/2020   Welchol [colesevelam]  08/03/2020   Adhesive [tape] Rash 10/15/2013   Latex Rash 03/08/2013   Tapentadol Rash 10/15/2013    Vitals: BP 121/80   Pulse 87   Ht '5\' 4"'$  (1.626 m)   Wt 200 lb (90.7 kg)   BMI 34.33 kg/m  Last Weight:  Wt Readings from Last 1 Encounters:  12/13/21 200 lb (90.7 kg)   PIR:JJOA mass index is 34.33 kg/m.     Last Height:   Ht Readings from Last 1 Encounters:  12/13/21 '5\' 4"'$  (1.626 m)    Physical exam:  General: The patient is awake, alert and appears not in acute distress. The patient is well groomed. Head: Normocephalic, atraumatic. Neck is supple. Mallampati 3,  neck circumference:14.25'. Nasal airflow restricted,  TMJ is evident.  Retrognathia is seen.  Cardiovascular:  Regular rate and rhythm . Respiratory: Lungs are clear to auscultation. Skin:  Without evidence of edema, or rash Trunk: BMI is elevated - The patient's posture is erect.   Neurologic exam : The patient is awake and alert, oriented to place and time.   Cranial nerves: intact sense of smell and taste.  Pupils are equal and briskly reactive to light.Visual fields by finger perimetry are intact. Hearing to finger rub intact. Facial sensation intact to fine touch.  Facial motor strength is symmetric and tongue and uvula move midline. Shoulder shrug was symmetrical.  Deep tendon reflexes: in the  upper and lower extremities are symmetric and intact.   The patient was advised of the nature of the diagnosed sleep disorder, the treatment options and risks for general a health and wellness arising from not treating the condition.  I spent  more than 15 minutes of face to face time with the patient. Greater than 50% of time was spent in counseling and coordination of care. We have discussed the diagnosis and differential and I answered the patient's questions.     Assessment:  After physical and neurologic examination, review of laboratory studies,  Personal review of imaging studies, reports of other /same  Imaging studies ,  Results of polysomnography/ neurophysiology testing and pre-existing records as far as provided in visit., my assessment is   0) essential tremor, no cogwheelig, no abnormal EOM, no facial masking, no sign of PD.   1) OSA- keep on using  Auto CPAP- at current settings.  New DME.   2) Migraine-No longer on topiramate for headaches. She tapered of sucessfully.  CPAP improved sleepiness, fatigue, and headaches.   3) reduced BMI- keep exercising, low carbohydrate diet, increase protein intake.   RV once a year, DME changed to adapt. New machine due in the next year 2024/   Larey Seat MD   12-13-2021  12/13/2021   CC: Harlan Stains, [place],   [id number]

## 2021-12-23 ENCOUNTER — Ambulatory Visit: Payer: Self-pay | Admitting: Licensed Clinical Social Worker

## 2021-12-23 NOTE — Patient Outreach (Signed)
  Care Coordination   Initial Visit Note   12/23/2021 Name: Robyn Aguilar MRN: [id number] DOB: 06/30/1950  Robyn Aguilar is a 71 y.o. year old female who sees Harlan Stains, MD for primary care. I spoke with  Robyn Aguilar by phone today  What matters to the patients health and wellness today?  Patient requested appointment for 08/25.     Goals Addressed               This Visit's Progress     MSW Care Coordination (pt-stated)        Patient advise she has several needs and concerns. Patient unable to discuss due to being at work. SW will contact patient on 08/25 as preferred by patient  in the morning.        SDOH assessments and interventions completed:  Yes     Care Coordination Interventions Activated:  Yes  Care Coordination Interventions:  Yes, provided   Follow up plan: Follow up call scheduled for 08/25    Encounter Outcome:  Pt. Scheduled   [name], MSW  Social Worker [facility]  [phone]

## 2021-12-24 ENCOUNTER — Encounter: Payer: Self-pay | Admitting: Licensed Clinical Social Worker

## 2021-12-24 ENCOUNTER — Telehealth: Payer: Self-pay | Admitting: Licensed Clinical Social Worker

## 2021-12-24 NOTE — Patient Outreach (Signed)
  Care Coordination   12/24/2021 Name: Robyn Aguilar MRN: [id number] DOB: 28-May-1950   Care Coordination Outreach Attempts:  An unsuccessful telephone outreach was attempted today to offer the patient information about available care coordination services as a benefit of their health plan.   Follow Up Plan:  Additional outreach attempts will be made to offer the patient care coordination information and services.   Encounter Outcome:  No Answer  Care Coordination Interventions Activated:  No   Care Coordination Interventions:  No, not indicated    [name], MSW  Social Worker [facility]  [phone]

## 2022-01-31 DIAGNOSIS — J069 Acute upper respiratory infection, unspecified: Secondary | ICD-10-CM | POA: Diagnosis not present

## 2022-01-31 DIAGNOSIS — R059 Cough, unspecified: Secondary | ICD-10-CM | POA: Diagnosis not present

## 2022-01-31 DIAGNOSIS — Z20822 Contact with and (suspected) exposure to covid-19: Secondary | ICD-10-CM | POA: Diagnosis not present

## 2022-03-08 DIAGNOSIS — E559 Vitamin D deficiency, unspecified: Secondary | ICD-10-CM | POA: Diagnosis not present

## 2022-03-08 DIAGNOSIS — E041 Nontoxic single thyroid nodule: Secondary | ICD-10-CM | POA: Diagnosis not present

## 2022-03-08 DIAGNOSIS — I10 Essential (primary) hypertension: Secondary | ICD-10-CM | POA: Diagnosis not present

## 2022-03-08 DIAGNOSIS — E1169 Type 2 diabetes mellitus with other specified complication: Secondary | ICD-10-CM | POA: Diagnosis not present

## 2022-03-08 DIAGNOSIS — E785 Hyperlipidemia, unspecified: Secondary | ICD-10-CM | POA: Diagnosis not present

## 2022-03-10 NOTE — Progress Notes (Signed)
Cardiology Office Note:    Date:  03/15/2022   ID:  Robyn Aguilar, DOB April 08, 1951, MRN [id number]  PCP:  Harlan Stains, MD   Ali Molina Providers Cardiologist:  Sinclair Grooms, MD   Referring MD: Harlan Stains, MD    History of Present Illness:    Robyn Aguilar is a 71 y.o. female with a hx of HFpEF, HLD, OSA on CPAP, prior TIAs, and DMII who was previously followed by Dr. [name] who now presents to clinic for follow-up.   Patient has been followed by Dr. [name] for HFpEF and HLD. TTE in 2020 with LVEF 60-65%, mild LVH, normal RV, mild PR. Was seen in 01/2021 for chest pain and ultimately underwent LHC on 02/22/21 which was negative for obstructive disease. She has been maintained on medical therapy.  Today, the patient overall feels okay. She states that she is suffering fatigue which has been ongoing for the past 4-5years. No chest pain, SOB, LE edema, orthopnea. She is able to walk without significant exertional symptoms. Has been compliant with her CPAP machine.  Has lost about 30lbs on ozempic and she feels like this has helped her fatigue.   Past Medical History:  Diagnosis Date   Allergic rhinitis    Anxiety    Arthritis    knees and neck   Cataracts, bilateral    immature    CHF (congestive heart failure) ([place]) 2014   after surgery   Complication of anesthesia    had heart failure after knee surgery 02/04/13   DDD (degenerative disc disease), lumbar    Diabetes mellitus without complication (HCC)    Dizziness    takes Antivert prn   GERD (gastroesophageal reflux disease)    takes Aciphex prn   High cholesterol    but doesn't take any meds   History of colon polyps    History of migraine    last one 6-64month ago;takes Topamax nightly   Hypertension    takes Metoprolol and maxzide daily   Joint pain    Joint swelling    Migraine    Migraines    Pneumonia    at age 51   Seizures ([facility]    hx of;last one 377yrago;no meds  required;states it was brought on by med;only one ever had   Shortness of breath 02/06/2013   Sleep apnea    sleep study in epic from 2014-uses CPAP   Stroke ([facility]   2 TIA   TIA (transient ischemic attack)     Past Surgical History:  Procedure Laterality Date   ABDOMINAL HYSTERECTOMY  1991   ANTERIOR LAT LUMBAR FUSION N/A 11/21/2019   Procedure: ANTERIOR LATERAL LUMBAR FUSION (XLIF) with lateral plate Lumbar two through lumbar three;  Surgeon: BrMelina SchoolsMD;  [facility] Service: Orthopedics;  Laterality: N/A;  3 hrs   carpel tunnel Right 1988   COLONOSCOPY     disc surgery  1996   ESOPHAGOGASTRODUODENOSCOPY     EYE SURGERY Bilateral    cataract surgery with lens implants   HERNIA REPAIR  1987   x 2   KNEE SURGERY Left 2011   replacement-partial   LEFT HEART CATH AND CORONARY ANGIOGRAPHY N/A 02/22/2021   Procedure: LEFT HEART CATH AND CORONARY ANGIOGRAPHY;  Surgeon: EnNelva BushMD;  [facility];  Service: Cardiovascular;  Laterality: N/A;   LUMBAR LAMINECTOMY/DECOMPRESSION MICRODISCECTOMY Right 07/13/2017   Procedure: Disectomy Right L2-3;  Surgeon: BrMelina SchoolsMD;  [facility];  Service: Orthopedics;  Laterality: Right;  2.5 hrs   ROBOTIC ASSISTED BILATERAL SALPINGO OOPHERECTOMY Bilateral 10/22/2013   Procedure: ROBOTIC ASSISTED BILATERAL SALPINGO OOPHORECTOMY;  Surgeon: Imagene Gurney A. Alycia Rossetti, MD;  Location: [facility];  Service: Gynecology;  Laterality: Bilateral;   ROTATOR CUFF REPAIR Right    TOTAL KNEE ARTHROPLASTY Right    TOTAL KNEE ARTHROPLASTY Right 02/04/2013   Procedure: RIGHT TOTAL KNEE ARTHROPLASTY;  Surgeon: Vickey Huger, MD;  Location: [place];  Service: Orthopedics;  Laterality: Right;    Current Medications: Current Meds  Medication Sig   Accu-Chek Softclix Lancets lancets USE TO TEST EVERY DAY   acetaminophen (TYLENOL) 650 MG CR tablet Take 1,300 mg by mouth every 8 (eight) hours as needed for pain.   AMBULATORY NON FORMULARY  MEDICATION Medication Name: 90 ml viscous lidocaine:90 ml '10mg'$ /50m Dicyclomine:270 ml Maalox - 10 ml every 4-6 hours as needed.   aspirin EC 81 MG tablet Take 81 mg by mouth daily.   cetirizine (ZYRTEC) 10 MG tablet Take 10 mg by mouth daily.    cholecalciferol (VITAMIN D3) 25 MCG (1000 UNIT) tablet Take 1,000 Units by mouth daily.   cyclobenzaprine (FLEXERIL) 10 MG tablet Take 10 mg by mouth 3 (three) times daily as needed for muscle spasms.   empagliflozin (JARDIANCE) 10 MG TABS tablet Take 1 tablet (10 mg total) by mouth daily before breakfast.   Evolocumab (REPATHA SURECLICK) 1588MG/ML SOAJ INJECT 1 PEN INTO THE SKIN EVERY 14 DAYS   ezetimibe (ZETIA) 10 MG tablet TAKE 1 TABLET(10 MG) BY MOUTH DAILY   fluticasone (FLONASE) 50 MCG/ACT nasal spray Place 1-2 sprays into both nostrils at bedtime. As needed   glucose blood (ACCU-CHEK AVIVA PLUS) test strip TEST EVERY DAY AS DIRECTED   lidocaine (LIDODERM) 5 % Place 1 patch onto the skin daily as needed (pain).   LORazepam (ATIVAN) 0.5 MG tablet Take 0.25-0.5 mg by mouth daily as needed for anxiety.   meperidine (DEMEROL) 50 MG tablet Take 50 mg by mouth every 6 (six) hours as needed for severe pain.   metoprolol succinate (TOPROL-XL) 50 MG 24 hr tablet Take 50 mg by mouth daily. Take with or immediately following a meal.   nitroGLYCERIN (NITROSTAT) 0.4 MG SL tablet Place 1 tablet (0.4 mg total) under the tongue every 5 (five) minutes as needed for chest pain. Take up to 3 tablets   RABEprazole (ACIPHEX) 20 MG tablet Take 20 mg by mouth every other day.   Semaglutide,0.25 or 0.'5MG'$ /DOS, (OZEMPIC, 0.25 OR 0.5 MG/DOSE,) 2 MG/1.5ML SOPN Inject 0.25 mg into the skin every Sunday.   triamterene-hydrochlorothiazide (MAXZIDE) 75-50 MG tablet Take 0.5 tablets by mouth daily. Pt take one half tablet daily     Allergies:   Codeine, Statins, Ambien [zolpidem], Bempedoic acid, Budesonide, Crestor [rosuvastatin], Demerol [meperidine hcl], Edarbi [azilsartan],  Fexofenadine, Olmesartan, Sulfamethoxazole-trimethoprim, Valsartan-hydrochlorothiazide, Welchol [colesevelam], Adhesive [tape], Latex, and Tapentadol   Social History   Socioeconomic History   Marital status: Married    Spouse name: Not on file   Number of children: 2   Years of education: college   Highest education level: Not on file  Occupational History    Employer: [employer]  Tobacco Use   Smoking status: Never   Smokeless tobacco: Never  Vaping Use   Vaping Use: Never used  Substance and Sexual Activity   Alcohol use: No    Alcohol/week: 0.0 standard drinks of alcohol   Drug use: No   Sexual activity: Yes  Birth control/protection: Surgical  Other Topics Concern   Not on file  Social History Narrative   ** Merged History Encounter **       Lives with husband Caffeine use:   Social Determinants of Radio broadcast assistant Strain: Not on file  Food Insecurity: Not on file  Transportation Needs: Not on file  Physical Activity: Not on file  Stress: Not on file  Social Connections: Not on file     Family History: The patient's family history includes Bone cancer in her maternal grandfather; Colon cancer in her maternal grandfather; Hypertension in her mother; Pancreatic cancer in her mother; Prostate cancer in an other family member; Sleep apnea in her brother, maternal uncle, and mother. There is no history of Stomach cancer or Esophageal cancer.  ROS:   Please see the history of present illness.     All other systems reviewed and are negative.  EKGs/Labs/Other Studies Reviewed:    The following studies were reviewed today: Cath 01/2021: Coronary angiography performed in October 2022: Diagnostic Dominance: Right Intervention Flowsheet Row Most Recent Value  AO Systolic Pressure 381 mmHg  AO Diastolic Pressure 72 mmHg  AO Mean 98 mmHg  LV Systolic Pressure 829 mmHg  LV Diastolic Pressure 8 mmHg  LV EDP 24 mmHg  AOp Systolic Pressure 937  mmHg  AOp Diastolic Pressure 78 mmHg  AOp Mean Pressure 169 mmHg  LVp Systolic Pressure 678 mmHg  LVp Diastolic Pressure 13 mmHg  LVp EDP Pressure 17 mmHg   TTE 03/2019: IMPRESSIONS     1. Left ventricular ejection fraction, by visual estimation, is 60 to  65%. The left ventricle has normal function. Left ventricular septal wall  thickness was mildly increased. Mildly increased left ventricular  posterior wall thickness. There is mildly  increased left ventricular hypertrophy.   2. No evidece of hypertrophic cardiomyopathy.   3. Global right ventricle has normal systolic function.The right  ventricular size is normal. No increase in right ventricular wall  thickness.   4. Left atrial size was normal.   5. Right atrial size was normal.   6. The mitral valve is normal in structure. Trace mitral valve  regurgitation. No evidence of mitral stenosis.   7. The tricuspid valve is normal in structure. Tricuspid valve  regurgitation is not demonstrated.   8. The aortic valve is tricuspid. Aortic valve regurgitation is not  visualized. No evidence of aortic valve sclerosis or stenosis.   9. The pulmonic valve was normal in structure. Pulmonic valve  regurgitation is mild.  10. The inferior vena cava is normal in size with greater than 50%  respiratory variability, suggesting right atrial pressure of 3 mmHg.   EKG:  EKG is  ordered today.  The ekg ordered today demonstrates NSR, RBBB, LAFB, HR 82  Recent Labs: No results found for requested labs within last 365 days.  Recent Lipid Panel    Component Value Date/Time   CHOL 146 08/24/2020 0919   TRIG 109 08/24/2020 0919   HDL 54 08/24/2020 0919   CHOLHDL 2.7 08/24/2020 0919   CHOLHDL 10.6 02/07/2013 0650   VLDL 24 02/07/2013 0650   LDLCALC 72 08/24/2020 0919     Risk Assessment/Calculations:                Physical Exam:    VS:  BP 138/80   Pulse 82   Ht '5\' 4"'$  (1.626 m)   Wt 193 lb (87.5 kg)   SpO2 96%   BMI 33.13  kg/m     Wt Readings from Last 3 Encounters:  03/15/22 193 lb (87.5 kg)  12/13/21 200 lb (90.7 kg)  07/06/21 212 lb 6 oz (96.3 kg)     GEN:  Well nourished, well developed in no acute distress HEENT: Normal NECK: No JVD; No carotid bruits CARDIAC: RRR, no murmurs, rubs, gallops RESPIRATORY:  Clear to auscultation without rales, wheezing or rhonchi  ABDOMEN: Soft, non-tender, non-distended MUSCULOSKELETAL:  No edema; No deformity  SKIN: Warm and dry NEUROLOGIC:  Alert and oriented x 3 PSYCHIATRIC:  Normal affect   ASSESSMENT:    1. Coronary artery disease involving native coronary artery of native heart without angina pectoris   2. Chronic diastolic CHF (congestive heart failure) ([place])   3. Essential hypertension   4. History of TIA (transient ischemic attack)   5. OSA on CPAP   6. Chronic diastolic heart failure (HCC)    PLAN:    In order of problems listed above:  #Mild nonobstructive CAD: Cath 01/2021 with 30-40% distal RCA lesion. Currently, doing well without chest pain or SOB. Will continue with medical therapy as below. -Continue zetia '10mg'$  daily, repatha '140mg'$  q2 weeks -Continue ASA '81mg'$  daily  #Chronic Diastolic HF: #HFpEF: Remote history of acute HFpEF exacerbation following knee surgery. Last TTE 2020 with LVEF 60-65%, normal RV, mild PR. Currently euvolemic and compensated. -Continue jardiance '10mg'$  daily -Continue ozempic  -Low Na diet  #Fatigue: Has been ongoing for 4-5 years. TSH normal. Patient is not anemic. Has been compliant with CPAP for underlying OSA. Cath without significant CAD. Will trial changing her metop to the evening to see if this helps. Can always try changing the med in the future as well if needed.   #HTN: Well controlled and at goal <130/90. -Continue metop '50mg'$  XL daily (will trial at night) -Triamterene-HCTZ 75-'50mg'$    #HLD: #Statin Intolerance: -Continue zetia '10mg'$  daily, repatha '140mg'$  q2 weeks -Follows with Pharm  D  #History of TIAs: -Continue zetia '10mg'$  daily, repatha '140mg'$  q2 weeks -Continue ASA '81mg'$  daily  #Bifascicular Block: ECG with RBBB, LAFB. Asymptomatic at this time. Will monitor.  #OSA: Compliant with CPAP         Medication Adjustments/Labs and Tests Ordered: Current medicines are reviewed at length with the patient today.  Concerns regarding medicines are outlined above.  Orders Placed This Encounter  Procedures   EKG 12-Lead   No orders of the defined types were placed in this encounter.   Patient Instructions  Medication Instructions:   Your physician recommends that you continue on your current medications as directed. Please refer to the Current Medication list given to you today.  *If you need a refill on your cardiac medications before your next appointment, please call your pharmacy*    Follow-Up: At [facility], you and your health needs are our priority.  As part of our continuing mission to provide you with exceptional heart care, we have created designated Provider Care Teams.  These Care Teams include your primary Cardiologist (physician) and Advanced Practice Providers (APPs -  Physician Assistants and Nurse Practitioners) who all work together to provide you with the care you need, when you need it.  We recommend signing up for the patient portal called "MyChart".  Sign up information is provided on this After Visit Summary.  MyChart is used to connect with patients for Virtual Visits (Telemedicine).  Patients are able to view lab/test results, encounter notes, upcoming appointments, etc.  Non-urgent messages can be sent to your  provider as well.   To learn more about what you can do with MyChart, go to [email or web address].    Your next appointment:   6 month(s)  The format for your next appointment:   In Person  Provider:   DR. Johney Frame   Important Information About Sugar         Signed, Freada Bergeron, MD  03/15/2022  9:40 AM    [place]

## 2022-03-15 ENCOUNTER — Encounter: Payer: Self-pay | Admitting: Cardiology

## 2022-03-15 ENCOUNTER — Ambulatory Visit: Payer: Medicare PPO | Attending: Cardiology | Admitting: Cardiology

## 2022-03-15 VITALS — BP 138/80 | HR 82 | Ht 64.0 in | Wt 193.0 lb

## 2022-03-15 DIAGNOSIS — I5032 Chronic diastolic (congestive) heart failure: Secondary | ICD-10-CM | POA: Diagnosis not present

## 2022-03-15 DIAGNOSIS — I10 Essential (primary) hypertension: Secondary | ICD-10-CM

## 2022-03-15 DIAGNOSIS — G4733 Obstructive sleep apnea (adult) (pediatric): Secondary | ICD-10-CM | POA: Diagnosis not present

## 2022-03-15 DIAGNOSIS — I2510 Atherosclerotic heart disease of native coronary artery without angina pectoris: Secondary | ICD-10-CM | POA: Diagnosis not present

## 2022-03-15 DIAGNOSIS — Z8673 Personal history of transient ischemic attack (TIA), and cerebral infarction without residual deficits: Secondary | ICD-10-CM | POA: Diagnosis not present

## 2022-03-15 NOTE — Patient Instructions (Signed)
Medication Instructions:   Your physician recommends that you continue on your current medications as directed. Please refer to the Current Medication list given to you today.  *If you need a refill on your cardiac medications before your next appointment, please call your pharmacy*   Follow-Up: At [facility], you and your health needs are our priority.  As part of our continuing mission to provide you with exceptional heart care, we have created designated Provider Care Teams.  These Care Teams include your primary Cardiologist (physician) and Advanced Practice Providers (APPs -  Physician Assistants and Nurse Practitioners) who all work together to provide you with the care you need, when you need it.  We recommend signing up for the patient portal called "MyChart".  Sign up information is provided on this After Visit Summary.  MyChart is used to connect with patients for Virtual Visits (Telemedicine).  Patients are able to view lab/test results, encounter notes, upcoming appointments, etc.  Non-urgent messages can be sent to your provider as well.   To learn more about what you can do with MyChart, go to [email or web address].    Your next appointment:   6 month(s)  The format for your next appointment:   In Person  Provider:   DR. PEMBERTON  Important Information About Sugar       

## 2022-03-30 ENCOUNTER — Ambulatory Visit (LOCAL_COMMUNITY_HEALTH_CENTER): Payer: Medicare PPO

## 2022-03-30 DIAGNOSIS — Z23 Encounter for immunization: Secondary | ICD-10-CM | POA: Diagnosis not present

## 2022-03-30 DIAGNOSIS — Z719 Counseling, unspecified: Secondary | ICD-10-CM

## 2022-03-30 NOTE — Progress Notes (Signed)
Client seen in [place] vaccine.  VIS provided   Are you feeling sick today? No   Have you ever received a dose of COVID-19 Vaccine? [employer], [place], [place], [place], Other) Yes  If yes, which vaccine and how many doses?    Stout  5   Did you bring the vaccination record card or other documentation?  Yes   Do you have a health condition or are undergoing treatment that makes you moderately or severely immunocompromised? This would include, but not be limited to: cancer, HIV, organ transplant, immunosuppressive therapy/high-dose corticosteroids, or moderate/severe primary immunodeficiency.  No  Have you received COVID-19 vaccine before or during hematopoietic cell transplant (HCT) or CAR-T-cell therapies? No  Have you ever had an allergic reaction to: (This would include a severe allergic reaction or a reaction that caused hives, swelling, or respiratory distress, including wheezing.) A component of a COVID-19 vaccine or a previous dose of COVID-19 vaccine? No   Have you ever had an allergic reaction to another vaccine (other thanCOVID-19 vaccine) or an injectable medication? (This would include a severe allergic reaction or a reaction that caused hives, swelling, or respiratory distress, including wheezing.)   No    Do you have a history of any of the following:  Myocarditis or Pericarditis No  Dermal fillers:  No  Multisystem Inflammatory Syndrome (MIS-C or MIS-A)? No  COVID-19 disease within the past 3 months? No  Vaccinated with monkeypox vaccine in the last 4 weeks? No   Vaccine administered and tolerated well. After vaccine care reviewed. Copy of NCIR provided.  Client observed for 15 minutes with no issues.  [name] Kitchenme

## 2022-04-01 ENCOUNTER — Other Ambulatory Visit: Payer: Self-pay

## 2022-04-01 ENCOUNTER — Emergency Department (HOSPITAL_COMMUNITY): Payer: Medicare PPO

## 2022-04-01 ENCOUNTER — Encounter (HOSPITAL_COMMUNITY): Payer: Self-pay

## 2022-04-01 ENCOUNTER — Emergency Department (HOSPITAL_COMMUNITY)
Admission: EM | Admit: 2022-04-01 | Discharge: 2022-04-02 | Disposition: A | Discharge status: Home / Self Care | Payer: Medicare PPO | Attending: Emergency Medicine | Admitting: Emergency Medicine

## 2022-04-01 DIAGNOSIS — M160 Bilateral primary osteoarthritis of hip: Secondary | ICD-10-CM | POA: Diagnosis not present

## 2022-04-01 DIAGNOSIS — S065X0A Traumatic subdural hemorrhage without loss of consciousness, initial encounter: Secondary | ICD-10-CM | POA: Diagnosis not present

## 2022-04-01 DIAGNOSIS — K760 Fatty (change of) liver, not elsewhere classified: Secondary | ICD-10-CM | POA: Diagnosis not present

## 2022-04-01 DIAGNOSIS — I509 Heart failure, unspecified: Secondary | ICD-10-CM | POA: Insufficient documentation

## 2022-04-01 DIAGNOSIS — S8011XA Contusion of right lower leg, initial encounter: Secondary | ICD-10-CM | POA: Insufficient documentation

## 2022-04-01 DIAGNOSIS — S065XAA Traumatic subdural hemorrhage with loss of consciousness status unknown, initial encounter: Secondary | ICD-10-CM

## 2022-04-01 DIAGNOSIS — S299XXA Unspecified injury of thorax, initial encounter: Secondary | ICD-10-CM | POA: Diagnosis not present

## 2022-04-01 DIAGNOSIS — I2510 Atherosclerotic heart disease of native coronary artery without angina pectoris: Secondary | ICD-10-CM | POA: Insufficient documentation

## 2022-04-01 DIAGNOSIS — Z981 Arthrodesis status: Secondary | ICD-10-CM | POA: Diagnosis not present

## 2022-04-01 DIAGNOSIS — Z79899 Other long term (current) drug therapy: Secondary | ICD-10-CM | POA: Diagnosis not present

## 2022-04-01 DIAGNOSIS — M19031 Primary osteoarthritis, right wrist: Secondary | ICD-10-CM | POA: Diagnosis not present

## 2022-04-01 DIAGNOSIS — I10 Essential (primary) hypertension: Secondary | ICD-10-CM | POA: Diagnosis not present

## 2022-04-01 DIAGNOSIS — S199XXA Unspecified injury of neck, initial encounter: Secondary | ICD-10-CM | POA: Diagnosis not present

## 2022-04-01 DIAGNOSIS — Z7982 Long term (current) use of aspirin: Secondary | ICD-10-CM | POA: Insufficient documentation

## 2022-04-01 DIAGNOSIS — R519 Headache, unspecified: Secondary | ICD-10-CM | POA: Insufficient documentation

## 2022-04-01 DIAGNOSIS — R0789 Other chest pain: Secondary | ICD-10-CM | POA: Diagnosis not present

## 2022-04-01 DIAGNOSIS — Y92410 Unspecified street and highway as the place of occurrence of the external cause: Secondary | ICD-10-CM | POA: Insufficient documentation

## 2022-04-01 DIAGNOSIS — Z96642 Presence of left artificial hip joint: Secondary | ICD-10-CM | POA: Diagnosis not present

## 2022-04-01 DIAGNOSIS — R1030 Lower abdominal pain, unspecified: Secondary | ICD-10-CM | POA: Insufficient documentation

## 2022-04-01 DIAGNOSIS — S8012XA Contusion of left lower leg, initial encounter: Secondary | ICD-10-CM | POA: Diagnosis not present

## 2022-04-01 DIAGNOSIS — Z91040 Latex allergy status: Secondary | ICD-10-CM | POA: Insufficient documentation

## 2022-04-01 DIAGNOSIS — S301XXA Contusion of abdominal wall, initial encounter: Secondary | ICD-10-CM | POA: Diagnosis not present

## 2022-04-01 DIAGNOSIS — M79604 Pain in right leg: Secondary | ICD-10-CM | POA: Diagnosis present

## 2022-04-01 DIAGNOSIS — Z041 Encounter for examination and observation following transport accident: Secondary | ICD-10-CM | POA: Diagnosis not present

## 2022-04-01 DIAGNOSIS — S6730XA Crushing injury of unspecified wrist, initial encounter: Secondary | ICD-10-CM | POA: Diagnosis not present

## 2022-04-01 LAB — URINALYSIS, ROUTINE W REFLEX MICROSCOPIC
Bilirubin Urine: NEGATIVE
Glucose, UA: >=500 mg/dL — AB
Hgb urine dipstick: NEGATIVE
Ketones, ur: NEGATIVE mg/dL
Leukocytes,Ua: NEGATIVE
Nitrite: NEGATIVE
Protein, ur: NEGATIVE mg/dL
Specific Gravity, Urine: 1.012 (ref 1.005–1.030)
pH: 6 (ref 5.0–8.0)

## 2022-04-01 LAB — CBC
HCT: 44.4 % (ref 36.0–46.0)
Hemoglobin: 14.9 g/dL (ref 12.0–15.0)
MCH: 28.4 pg (ref 26.0–34.0)
MCHC: 33.6 g/dL (ref 30.0–36.0)
MCV: 84.7 fL (ref 80.0–100.0)
Platelets: 247 10*3/uL (ref 150–400)
RBC: 5.24 MIL/uL — ABNORMAL HIGH (ref 3.87–5.11)
RDW: 15.4 % (ref 11.5–15.5)
WBC: 6.6 10*3/uL (ref 4.0–10.5)
nRBC: 0 % (ref 0.0–0.2)

## 2022-04-01 LAB — I-STAT CHEM 8, ED
BUN: 20 mg/dL (ref 8–23)
Calcium, Ion: 1.13 mmol/L — ABNORMAL LOW (ref 1.15–1.40)
Chloride: 101 mmol/L (ref 98–111)
Creatinine, Ser: 0.9 mg/dL (ref 0.44–1.00)
Glucose, Bld: 111 mg/dL — ABNORMAL HIGH (ref 70–99)
HCT: 46 % (ref 36.0–46.0)
Hemoglobin: 15.6 g/dL — ABNORMAL HIGH (ref 12.0–15.0)
Potassium: 3.3 mmol/L — ABNORMAL LOW (ref 3.5–5.1)
Sodium: 140 mmol/L (ref 135–145)
TCO2: 29 mmol/L (ref 22–32)

## 2022-04-01 LAB — ETHANOL: Alcohol, Ethyl (B): <10 mg/dL (ref <10)

## 2022-04-01 MED ORDER — FENTANYL CITRATE PF 50 MCG/ML IJ SOSY
50.0000 ug | PREFILLED_SYRINGE | Freq: Once | INTRAMUSCULAR | Status: AC
  Administered 2022-04-01: 50 ug via INTRAVENOUS
  Filled 2022-04-01: qty 1

## 2022-04-01 MED ORDER — ONDANSETRON HCL 4 MG/2ML IJ SOLN
4.0000 mg | Freq: Once | INTRAMUSCULAR | Status: AC
  Administered 2022-04-01: 4 mg via INTRAVENOUS
  Filled 2022-04-01: qty 2
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
## 2022-04-02 DIAGNOSIS — S065X0A Traumatic subdural hemorrhage without loss of consciousness, initial encounter: Secondary | ICD-10-CM | POA: Diagnosis not present

## 2022-04-02 DIAGNOSIS — Z981 Arthrodesis status: Secondary | ICD-10-CM | POA: Diagnosis not present

## 2022-04-02 DIAGNOSIS — S299XXA Unspecified injury of thorax, initial encounter: Secondary | ICD-10-CM | POA: Diagnosis not present

## 2022-04-02 DIAGNOSIS — S199XXA Unspecified injury of neck, initial encounter: Secondary | ICD-10-CM | POA: Diagnosis not present

## 2022-04-02 DIAGNOSIS — K760 Fatty (change of) liver, not elsewhere classified: Secondary | ICD-10-CM | POA: Diagnosis not present

## 2022-04-02 LAB — COMPREHENSIVE METABOLIC PANEL
ALT: 36 U/L (ref 0–44)
AST: 36 U/L (ref 15–41)
Albumin: 4.3 g/dL (ref 3.5–5.0)
Alkaline Phosphatase: 73 U/L (ref 38–126)
Anion gap: 10 (ref 5–15)
BUN: 17 mg/dL (ref 8–23)
CO2: 29 mmol/L (ref 22–32)
Calcium: 10.1 mg/dL (ref 8.9–10.3)
Chloride: 100 mmol/L (ref 98–111)
Creatinine, Ser: 0.99 mg/dL (ref 0.44–1.00)
GFR, Estimated: >60 mL/min (ref >60)
Glucose, Bld: 114 mg/dL — ABNORMAL HIGH (ref 70–99)
Potassium: 3.4 mmol/L — ABNORMAL LOW (ref 3.5–5.1)
Sodium: 139 mmol/L (ref 135–145)
Total Bilirubin: 0.6 mg/dL (ref 0.3–1.2)
Total Protein: 7.2 g/dL (ref 6.5–8.1)

## 2022-04-02 LAB — TROPONIN I (HIGH SENSITIVITY)
Troponin I (High Sensitivity): 13 ng/L (ref <18)
Troponin I (High Sensitivity): 15 ng/L (ref <18)

## 2022-04-02 MED ORDER — ACETAMINOPHEN 325 MG PO TABS
650.0000 mg | ORAL_TABLET | Freq: Once | ORAL | Status: AC
  Administered 2022-04-02: 650 mg via ORAL
  Filled 2022-04-02: qty 2

## 2022-04-02 MED ORDER — IOHEXOL 350 MG/ML SOLN
75.0000 mL | Freq: Once | INTRAVENOUS | Status: AC | PRN
  Administered 2022-04-02: 75 mL via INTRAVENOUS

## 2022-04-02 NOTE — Discharge Instructions (Addendum)
Stop taking your aspirin for the next week.  Please call your family doctor on Monday for prompt follow-up in the next few days.  Get rechecked immediately if you have confusion, vomiting, uncontrolled pain or new concerning symptoms.

## 2022-04-04 ENCOUNTER — Telehealth: Payer: Self-pay | Admitting: Neurology

## 2022-04-04 NOTE — Telephone Encounter (Signed)
Called pt. Relayed Dr. Guadelupe Sabin note. Pt verbalized understanding. She already put a call into PCP to schedule appt.   I scheduled appt with Dr Brett Fairy 06/30/22 at 8:30am. She wanted to leave appt scheduled in August as well. Asked this not be cx.

## 2022-04-04 NOTE — Telephone Encounter (Signed)
Please call patient back.  I reviewed the ED records and imaging results.  I recommend that patient FU with primary care today if she has any urgent concerns.  If she has sudden changes in symptoms such as sudden onset of one-sided weakness or numbness or tingling or droopy face or slurring of speech or severe headache, she will have to call 911 or have someone take her to the emergency room, this is the best way to get evaluated for any acute changes.  She can follow-up with the next available appointment with Dr. Brett Fairy, please move up her follow-up appointment in this regard.
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
## 2022-04-05 DIAGNOSIS — E119 Type 2 diabetes mellitus without complications: Secondary | ICD-10-CM | POA: Diagnosis not present

## 2022-04-05 DIAGNOSIS — G44311 Acute post-traumatic headache, intractable: Secondary | ICD-10-CM | POA: Diagnosis not present

## 2022-04-05 DIAGNOSIS — Z961 Presence of intraocular lens: Secondary | ICD-10-CM | POA: Diagnosis not present

## 2022-04-06 ENCOUNTER — Other Ambulatory Visit: Payer: Self-pay | Admitting: Family Medicine

## 2022-04-06 ENCOUNTER — Ambulatory Visit
Admission: RE | Admit: 2022-04-06 | Discharge: 2022-04-06 | Disposition: A | Discharge status: Home / Self Care | Payer: Medicare PPO | Source: Ambulatory Visit | Attending: Family Medicine | Admitting: Family Medicine

## 2022-04-06 DIAGNOSIS — S60211A Contusion of right wrist, initial encounter: Secondary | ICD-10-CM | POA: Diagnosis not present

## 2022-04-06 DIAGNOSIS — Z981 Arthrodesis status: Secondary | ICD-10-CM | POA: Diagnosis not present

## 2022-04-06 DIAGNOSIS — S065XAA Traumatic subdural hemorrhage with loss of consciousness status unknown, initial encounter: Secondary | ICD-10-CM

## 2022-04-06 DIAGNOSIS — M545 Low back pain, unspecified: Secondary | ICD-10-CM | POA: Diagnosis not present

## 2022-04-06 DIAGNOSIS — R42 Dizziness and giddiness: Secondary | ICD-10-CM | POA: Diagnosis not present

## 2022-04-06 DIAGNOSIS — M62838 Other muscle spasm: Secondary | ICD-10-CM | POA: Diagnosis not present

## 2022-04-06 DIAGNOSIS — S8012XA Contusion of left lower leg, initial encounter: Secondary | ICD-10-CM | POA: Diagnosis not present

## 2022-04-06 DIAGNOSIS — F419 Anxiety disorder, unspecified: Secondary | ICD-10-CM | POA: Diagnosis not present

## 2022-04-06 DIAGNOSIS — R519 Headache, unspecified: Secondary | ICD-10-CM | POA: Diagnosis not present

## 2022-04-06 DIAGNOSIS — G43909 Migraine, unspecified, not intractable, without status migrainosus: Secondary | ICD-10-CM | POA: Diagnosis not present

## 2022-04-06 DIAGNOSIS — M542 Cervicalgia: Secondary | ICD-10-CM | POA: Diagnosis not present
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
## 2022-04-11 NOTE — Telephone Encounter (Signed)
Ok to put in who ever has an opening in 1-2 weeks,

## 2022-04-19 ENCOUNTER — Ambulatory Visit: Payer: Medicare PPO | Admitting: Psychiatry

## 2022-04-19 ENCOUNTER — Encounter: Payer: Self-pay | Admitting: Psychiatry

## 2022-04-19 VITALS — BP 106/85 | HR 86 | Ht 64.0 in | Wt 187.0 lb

## 2022-04-19 DIAGNOSIS — G43109 Migraine with aura, not intractable, without status migrainosus: Secondary | ICD-10-CM

## 2022-04-19 DIAGNOSIS — S065XAA Traumatic subdural hemorrhage with loss of consciousness status unknown, initial encounter: Secondary | ICD-10-CM

## 2022-04-19 DIAGNOSIS — F0781 Postconcussional syndrome: Secondary | ICD-10-CM

## 2022-04-19 MED ORDER — UBRELVY 100 MG PO TABS: 100.0000 mg | ORAL_TABLET | ORAL | 6 refills | Status: DC | PRN
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
## 2022-04-19 NOTE — Patient Instructions (Signed)
Post Concussive Syndrome:  Post-concussion syndrome is a complex disorder in which various symptoms -- such as headaches and dizziness -- last for weeks and sometimes months after the injury that caused the concussion. Concussion is a mild traumatic brain injury, usually occurring after a blow to the head. Loss of consciousness isn't required for a diagnosis of concussion or post-concussion syndrome. In fact, the risk of post-concussion syndrome doesn't appear to be associated with the severity of the initial injury. In most people, post-concussion syndrome symptoms occur within the first seven to 10 days and go away within three months, though they can persist for a year or more. Post-concussion syndrome treatments are aimed at easing specific symptoms.  Post-concussion symptoms include: Headaches Headaches that occur after a concussion can vary and may feel like tension-type headaches or migraines. Most, however, are tension-type headaches, which may be associated with a neck injury that happened at the same time as the head injury. Dizziness  Fatigue  Irritability  In some cases, people experience behavior or emotional changes after a mild traumatic brain injury. Family members may notice that the person has become more irritable, suspicious, argumentative or stubborn. Anxiety  Insomnia  Loss of concentration and memory  Noise and light sensitivity  When to see a doctor See a doctor if you experience a head injury severe enough to cause confusion or amnesia -- even if you never lost consciousness. If a concussion occurs while you're playing a sport, don't go back in the game. Seek medical attention so that you don't risk worsening your injury.  Causes of concussion: In many cases, both physiological effects of brain trauma and emotional reactions to these effects play a role in the development of symptoms. Researchers haven't determined why some people who've had concussions develop  persistent post-concussion symptoms while others do not. No proven correlation between the severity of the injury and the likelihood of developing persistent post-concussion symptoms exists. Risk factors for developing post-concussion syndrome include: Age. Studies have found increasing age to be a risk factor for post-concussion syndrome.  Sex. Women are more likely to be diagnosed with post-concussion syndrome, but this may be because women are generally more likely to seek medical care.  Trauma. Concussions resulting from car collisions, falls, assaults and sports injuries are commonly associated with post-concussion syndrome.  Treatment: There is no specific treatment for post-concussion syndrome. Instead, your doctor will treat the individual symptoms you're experiencing. The types of symptoms and their frequency are unique to each person.  Instead of stopping activities entirely, learn to recognize the triggers that bring on concussion symptoms. Start back slowly, in small amounts. When symptoms occur, back off and rest. It's okay to do some of the activities that don't make you feel worse. Limit any activities that worsen your symptoms.  For example, activities that may bring on symptoms include: Texting/spending time looking at your smartphone screen.  Reading.  Watching television.  Playing video games.  Listening to loud music.  Doing any physical activity. As your symptoms improve, you can continue to add more of your activities back into your day.  Headaches Medications commonly used for migraines or tension headaches, including some antidepressants, appear to be effective when these types of headaches are associated with post-concussion syndrome. Examples include: Amitriptyline. This medication has been widely used for post-traumatic injuries, as well as for symptoms commonly associated with post-concussion syndrome, such as irritability, dizziness and depression. Topiramate.  Commonly used to treat migraines, topiramate (Qudexy XR, Topamax, Trokendi XR) may   be effective in reducing headaches after head injury. Common side effects of topiramate include weight loss and cognitive problems.  Gabapentin. Gabapentin (Gralise, Neurontin) is frequently used to treat a variety of types of pain and may be helpful in treating post-traumatic headaches. A common side effect of gabapentin is drowsiness.  Light sensitivity       Post concussion suggestions to follow when reading:  1. limit the amount of information on the page  2. use index cards or cardboard to cover the page or computer  3. for computers and digital devices: decrease the brightness, increase the font size and increase the contrast  4. read in 10 minute blocks and take 10-20 minutes of rest (no near focusing) try to increase to 3- 4 total blocks before increasing the time 5. use audio reinforcement for books or lectures 6. wear a brimmed hat if overhead lights are producing glare 7. Consider tints:  amber or blue, anti-reflective or blue light blocking,  Sunglasses when outside and Amber or Blue glasses for indoor and night time.  Memory and thinking problems No medications are currently recommended specifically for the treatment of cognitive problems after mild traumatic brain injury. Time may be the best therapy for post-concussion syndrome if you have cognitive problems, as most of them go away on their own in the weeks to months following the injury. Certain forms of cognitive therapy may be helpful, including focused rehabilitation that provides training in how to use a pocket calendar, electronic organizer or other techniques to work around memory deficits and attention skills. Relaxation therapy also may help.  Dizziness Vestibular rehab (a specialized form of physical therapy can help this.  Depression and anxiety The symptoms of post-concussion syndrome often improve after the affected person learns that  there is a cause for his or her symptoms and that they will likely improve with time. Education about the disorder can ease a person's fears and help provide peace of mind. If you're experiencing new or increasing depression or anxiety after a concussion, some treatment options include: Psychotherapy. It may be helpful to discuss your concerns with a psychologist or psychiatrist who has experience in working with people with brain injury.  Medication. To combat anxiety or depression, antidepressants or anti-anxiety medications may be prescribed.  Prevention: The only known way to prevent post-concussion syndrome is to avoid the head injury in the first place. Avoiding head injuries Although you can't prepare for every potential situation, here are some tips for avoiding common causes of head injuries: Fasten your seat belt whenever you're traveling in a car, and be sure children are in age-appropriate safety seats. Children under 13 are safest riding in the back seat, especially if your car has air bags.  Use helmets whenever you or your children are bicycling, roller-skating, in-line skating, ice-skating, skiing, snowboarding, playing football, batting or running the bases in softball or baseball, skateboarding, or horseback riding. Wear a helmet when riding a motorcycle.  Take steps around the house to prevent falls, such as removing small area rugs, improving lighting and installing handrails.  

## 2022-04-20 DIAGNOSIS — M5451 Vertebrogenic low back pain: Secondary | ICD-10-CM | POA: Diagnosis not present

## 2022-04-21 ENCOUNTER — Telehealth: Payer: Self-pay | Admitting: Psychiatry

## 2022-04-21 ENCOUNTER — Telehealth: Payer: Self-pay | Admitting: Neurology

## 2022-04-21 NOTE — Telephone Encounter (Signed)
Robyn Aguilar: [id number] exp. 04/21/22-05/21/22 sent to [facility] [phone]

## 2022-04-21 NOTE — Telephone Encounter (Signed)
PA completed on [employer] KEY: [id number]

## 2022-04-21 NOTE — Telephone Encounter (Signed)
Sent to [facility] [phone]

## 2022-04-21 NOTE — Telephone Encounter (Signed)
Drug approved through 12.31.2024

## 2022-04-21 NOTE — Telephone Encounter (Signed)
Robyn Aguilar is calling from [place] alone with pt. Robyn Aguilar said he is calling to start PA on CT for pt. Pt said this should have been schedule by now.[name] KitchenMarland KitchenMarland Kitchen

## 2022-04-21 NOTE — Telephone Encounter (Signed)
CT of head was not ordered as STAT. Once they get authorization she can get scheduled

## 2022-04-27 ENCOUNTER — Ambulatory Visit
Admission: RE | Admit: 2022-04-27 | Discharge: 2022-04-27 | Disposition: A | Discharge status: Home / Self Care | Payer: Medicare PPO | Source: Ambulatory Visit | Attending: Psychiatry | Admitting: Psychiatry

## 2022-04-27 DIAGNOSIS — S065XAA Traumatic subdural hemorrhage with loss of consciousness status unknown, initial encounter: Secondary | ICD-10-CM | POA: Diagnosis not present

## 2022-04-28 ENCOUNTER — Telehealth: Payer: Self-pay | Admitting: Psychiatry

## 2022-04-28 ENCOUNTER — Encounter: Payer: Self-pay | Admitting: Neurology

## 2022-04-28 NOTE — Telephone Encounter (Signed)
Sent the patient a mychart message advising that Dr Billey Gosling has been out of the office this week and once she returns she will review and result for the patient.

## 2022-04-28 NOTE — Telephone Encounter (Signed)
Pt called wanting to know when she will be called with her CT Results.

## 2022-04-30 ENCOUNTER — Other Ambulatory Visit: Payer: Self-pay | Admitting: Cardiology

## 2022-05-01 DIAGNOSIS — F4311 Post-traumatic stress disorder, acute: Secondary | ICD-10-CM | POA: Diagnosis not present

## 2022-05-03 ENCOUNTER — Encounter: Payer: Self-pay | Admitting: Pharmacist

## 2022-05-03 ENCOUNTER — Telehealth: Payer: Self-pay | Admitting: Pharmacist

## 2022-05-03 DIAGNOSIS — G720 Drug-induced myopathy: Secondary | ICD-10-CM | POA: Diagnosis not present

## 2022-05-03 DIAGNOSIS — I700 Atherosclerosis of aorta: Secondary | ICD-10-CM | POA: Diagnosis not present

## 2022-05-03 DIAGNOSIS — I10 Essential (primary) hypertension: Secondary | ICD-10-CM | POA: Diagnosis not present

## 2022-05-03 DIAGNOSIS — G43909 Migraine, unspecified, not intractable, without status migrainosus: Secondary | ICD-10-CM | POA: Diagnosis not present

## 2022-05-03 DIAGNOSIS — F419 Anxiety disorder, unspecified: Secondary | ICD-10-CM | POA: Diagnosis not present

## 2022-05-03 DIAGNOSIS — S065XAA Traumatic subdural hemorrhage with loss of consciousness status unknown, initial encounter: Secondary | ICD-10-CM | POA: Diagnosis not present

## 2022-05-03 DIAGNOSIS — F0781 Postconcussional syndrome: Secondary | ICD-10-CM | POA: Diagnosis not present

## 2022-05-03 NOTE — Telephone Encounter (Signed)
Needs PA renewed for [place]- per CMM pt approved through 05/02/23 [place] had expired- attempted to renew- pt needs to submit proof of income I called pt to let her know. She says she isnt sure if she has her tax return. States the person who did it, messed it up and said that they owed $10,000. She had to take out a loan to pay it. Then the government said they didn't owe that much. I advised that she could submit her SSI statement and her husbands pay stubs.

## 2022-05-03 NOTE — Telephone Encounter (Signed)
Pt call checking on status of CT results. Would like a call from the nurse.

## 2022-05-04 NOTE — Telephone Encounter (Signed)
Called patient to inform her that her CT showed her bleeding has improved and she could restart her aspirin. Patient was concerned that she should have another CT scan before her f/u visit with Dr Brett Fairy in Feb. Advised we would question this with Dr Billey Gosling. Pt verbalized understanding. Pt had no questions at this time but was encouraged to call back if questions arise.

## 2022-05-04 NOTE — Telephone Encounter (Signed)
CT of the brain shows improvement in her subdural hematoma. She can restart her daily aspirin

## 2022-05-04 NOTE — Telephone Encounter (Signed)
She shouldn't need a repeat scan before her next appointment unless she develops new symptoms such as worsening headaches or unilateral numbness/weakness. She should let me know if any symptoms like that develop

## 2022-05-05 DIAGNOSIS — K5900 Constipation, unspecified: Secondary | ICD-10-CM | POA: Diagnosis not present

## 2022-05-05 DIAGNOSIS — R58 Hemorrhage, not elsewhere classified: Secondary | ICD-10-CM | POA: Diagnosis not present

## 2022-05-05 DIAGNOSIS — K625 Hemorrhage of anus and rectum: Secondary | ICD-10-CM | POA: Diagnosis not present

## 2022-05-14 DIAGNOSIS — J069 Acute upper respiratory infection, unspecified: Secondary | ICD-10-CM | POA: Diagnosis not present

## 2022-05-18 DIAGNOSIS — J069 Acute upper respiratory infection, unspecified: Secondary | ICD-10-CM | POA: Diagnosis not present

## 2022-05-18 DIAGNOSIS — E1169 Type 2 diabetes mellitus with other specified complication: Secondary | ICD-10-CM | POA: Diagnosis not present

## 2022-05-30 DIAGNOSIS — E1169 Type 2 diabetes mellitus with other specified complication: Secondary | ICD-10-CM | POA: Diagnosis not present

## 2022-05-30 DIAGNOSIS — E785 Hyperlipidemia, unspecified: Secondary | ICD-10-CM | POA: Diagnosis not present

## 2022-05-30 DIAGNOSIS — G720 Drug-induced myopathy: Secondary | ICD-10-CM | POA: Diagnosis not present

## 2022-05-30 DIAGNOSIS — I700 Atherosclerosis of aorta: Secondary | ICD-10-CM | POA: Diagnosis not present

## 2022-05-30 DIAGNOSIS — G43909 Migraine, unspecified, not intractable, without status migrainosus: Secondary | ICD-10-CM | POA: Diagnosis not present

## 2022-05-30 DIAGNOSIS — F5101 Primary insomnia: Secondary | ICD-10-CM | POA: Diagnosis not present

## 2022-05-30 DIAGNOSIS — I10 Essential (primary) hypertension: Secondary | ICD-10-CM | POA: Diagnosis not present

## 2022-05-30 DIAGNOSIS — Z0000 Encounter for general adult medical examination without abnormal findings: Secondary | ICD-10-CM | POA: Diagnosis not present

## 2022-05-30 DIAGNOSIS — F419 Anxiety disorder, unspecified: Secondary | ICD-10-CM | POA: Diagnosis not present

## 2022-06-01 DIAGNOSIS — Z1231 Encounter for screening mammogram for malignant neoplasm of breast: Secondary | ICD-10-CM | POA: Diagnosis not present

## 2022-06-01 DIAGNOSIS — F4311 Post-traumatic stress disorder, acute: Secondary | ICD-10-CM | POA: Diagnosis not present

## 2022-06-01 DIAGNOSIS — Z01419 Encounter for gynecological examination (general) (routine) without abnormal findings: Secondary | ICD-10-CM | POA: Diagnosis not present

## 2022-06-30 ENCOUNTER — Ambulatory Visit: Payer: Medicare PPO | Admitting: Neurology

## 2022-06-30 ENCOUNTER — Encounter: Payer: Self-pay | Admitting: Neurology

## 2022-06-30 VITALS — BP 140/93 | HR 77 | Ht 64.0 in | Wt 187.0 lb

## 2022-06-30 DIAGNOSIS — F0781 Postconcussional syndrome: Secondary | ICD-10-CM

## 2022-06-30 DIAGNOSIS — G4733 Obstructive sleep apnea (adult) (pediatric): Secondary | ICD-10-CM | POA: Diagnosis not present

## 2022-06-30 DIAGNOSIS — S065XAA Traumatic subdural hemorrhage with loss of consciousness status unknown, initial encounter: Secondary | ICD-10-CM | POA: Diagnosis not present

## 2022-06-30 DIAGNOSIS — F4311 Post-traumatic stress disorder, acute: Secondary | ICD-10-CM | POA: Diagnosis not present

## 2022-06-30 DIAGNOSIS — G43109 Migraine with aura, not intractable, without status migrainosus: Secondary | ICD-10-CM

## 2022-06-30 DIAGNOSIS — G250 Essential tremor: Secondary | ICD-10-CM

## 2022-06-30 NOTE — Patient Instructions (Signed)
ASSESSMENT AND PLAN 72 y.o. 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   2) OSA on CPAP remains fully controlled, highest compliance.   3) No CT needed, the 2 imaging studies have confirmed no midline shift, no pressure , no edema present.  Resume ASA.   I plan to follow up either personally or through our NP within 12 months.   I would like to thank Harlan Stains, MD and Harlan Stains, [place],   [id number] for allowing me to meet with and to take care of this pleasant patient.   CC: I will share my notes with PCP .
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
## 2022-07-01 DIAGNOSIS — M5451 Vertebrogenic low back pain: Secondary | ICD-10-CM | POA: Diagnosis not present

## 2022-07-31 DIAGNOSIS — F4311 Post-traumatic stress disorder, acute: Secondary | ICD-10-CM | POA: Diagnosis not present

## 2022-08-04 DIAGNOSIS — M5451 Vertebrogenic low back pain: Secondary | ICD-10-CM | POA: Diagnosis not present

## 2022-08-09 DIAGNOSIS — M5451 Vertebrogenic low back pain: Secondary | ICD-10-CM | POA: Diagnosis not present

## 2022-08-12 DIAGNOSIS — M5451 Vertebrogenic low back pain: Secondary | ICD-10-CM | POA: Diagnosis not present

## 2022-08-23 DIAGNOSIS — M5451 Vertebrogenic low back pain: Secondary | ICD-10-CM | POA: Diagnosis not present

## 2022-08-24 ENCOUNTER — Encounter: Payer: Self-pay | Admitting: Internal Medicine

## 2022-08-24 ENCOUNTER — Ambulatory Visit: Payer: Medicare PPO | Admitting: Internal Medicine

## 2022-08-24 VITALS — BP 118/68 | HR 76 | Ht 64.0 in | Wt 188.0 lb

## 2022-08-24 DIAGNOSIS — K219 Gastro-esophageal reflux disease without esophagitis: Secondary | ICD-10-CM

## 2022-08-24 DIAGNOSIS — Z1211 Encounter for screening for malignant neoplasm of colon: Secondary | ICD-10-CM

## 2022-08-24 DIAGNOSIS — K5900 Constipation, unspecified: Secondary | ICD-10-CM | POA: Diagnosis not present

## 2022-08-24 MED ORDER — ONDANSETRON HCL 4 MG PO TABS: 4.0000 mg | ORAL_TABLET | Freq: Three times a day (TID) | ORAL | 0 refills | Status: AC | PRN

## 2022-08-24 MED ORDER — SUTAB 1479-225-188 MG PO TABS: ORAL_TABLET | ORAL | 0 refills | Status: AC

## 2022-08-24 NOTE — Progress Notes (Unsigned)
Chief Complaint: GERD, colon cancer screening  HPI : 72 year old female with history of GERD, DM, migraines, HFpEF, CVA, OSA, CAD presents for follow-up of GERD and to discuss colon cancer screening  Interval History: Her GERD is currently well controlled. She is still on rabeprazole, which works well. She is eating and drinking well. Denies dysphagia. GI cocktail worked, but she has not had to use it for several months. She does think that she is typically constipated. She has 2 BMs per week on average. Denies diarrhea. She is currently on Ozempic. She would like to talk about getting her colonoscopy scheduled for colon cancer screening  Wt Readings from Last 3 Encounters:  08/24/22 188 lb (85.3 kg)  06/30/22 187 lb (84.8 kg)  04/19/22 187 lb (84.8 kg)    Current Outpatient Medications  Medication Sig Dispense Refill   Accu-Chek Softclix Lancets lancets USE TO TEST EVERY DAY     acetaminophen (TYLENOL) 650 MG CR tablet Take 1,300 mg by mouth every 8 (eight) hours as needed for pain.     AMBULATORY NON FORMULARY MEDICATION Medication Name: 90 ml viscous lidocaine:90 ml /53ml Dicyclomine:270 ml Maalox - 10 ml every 4-6 hours as needed. 450 mL 1   aspirin EC 81 MG tablet Take 81 mg by mouth daily.     cetirizine (ZYRTEC) 10 MG tablet Take 10 mg by mouth daily.      cholecalciferol (VITAMIN D3) 25 MCG (1000 UNIT) tablet Take 1,000 Units by mouth daily.     desonide (DESOWEN) 0.05 % cream Apply 1 application  topically as needed.     empagliflozin (JARDIANCE) 10 MG TABS tablet Take 1 tablet (10 mg total) by mouth daily before breakfast. 90 tablet 3   Evolocumab (REPATHA SURECLICK) 140 MG/ML SOAJ INJECT 1 PEN INTO THE SKIN EVERY 14 DAYS 2 mL 11   ezetimibe (ZETIA) 10 MG tablet TAKE 1 TABLET(10 MG) BY MOUTH DAILY 90 tablet 3   fluticasone (FLONASE) 50 MCG/ACT nasal spray Place 1-2 sprays into both nostrils at bedtime. As needed     glucose blood (ACCU-CHEK AVIVA PLUS) test strip TEST EVERY  DAY AS DIRECTED     lidocaine (LIDODERM) 5 % Place 1 patch onto the skin daily as needed (pain).     LORazepam (ATIVAN) 0.5 MG tablet Take 0.25-0.5 mg by mouth daily as needed for anxiety.     meperidine (DEMEROL) 50 MG tablet Take 50 mg by mouth every 6 (six) hours as needed for severe pain.     metoprolol succinate (TOPROL-XL) 50 MG 24 hr tablet Take 50 mg by mouth daily. Take with or immediately following a meal.     nitroGLYCERIN (NITROSTAT) 0.4 MG SL tablet DISSOLVE 1 TABLET UNDER THE TONGUE EVERY 5 MINUTES AS NEEDED FOR CHEST PAIN. TAKE UP TO 3 TABLETS 25 tablet 3   RABEprazole (ACIPHEX) 20 MG tablet Take 20 mg by mouth every other day.     Semaglutide,0.25 or 0.5MG /DOS, (OZEMPIC, 0.25 OR 0.5 MG/DOSE,) 2 MG/1.5ML SOPN Inject 0.25 mg into the skin every Sunday.     triamterene-hydrochlorothiazide (MAXZIDE) 75-50 MG tablet Take 0.5 tablets by mouth daily. Pt take one half tablet daily  1   Ubrogepant (UBRELVY) 100 MG TABS Take 100 mg by mouth as needed (for migraine). May repeat a dose in 2 hours if needed. Max dose 2 pills in 24 hours 16 tablet 6   No current facility-administered medications for this visit.    Physical Exam: BP 118/68  Pulse 76   Ht  (1.626 m)   Wt 188 lb (85.3 kg)   BMI 32.27 kg/m  Constitutional: Pleasant,well-developed, female in no acute distress. HEENT: Normocephalic and atraumatic. Conjunctivae are normal. No scleral icterus. Cardiovascular: Normal rate Pulmonary/chest: Effort normal Abdominal: Soft, nondistended, nontender. Extremities: No edema Neurological: Alert and oriented to person place and time. Skin: Skin is warm and dry. No rashes noted. Psychiatric: Normal mood and affect. Behavior is normal.  Labs 07/2020: LFTs with mildly elevated ALT of 35, other LFTs nml.  Labs 01/2021: CBC nml, BMP with mildly elevated Cr of 1.28, mildly elevated Ca of 10.4  Barium swallow 05/04/21: IMPRESSION: Minimal esophageal dysmotility, likely  presbyesophagus. Otherwise, normal esophagram.  Colonoscopy 05/30/01: Multiple polyps sigmoid colon to rectum, 4-7 mm.    Colonoscopy 10/06/14: Few right sided diverticulosis. Excellent prep. Repeat colonoscopy in 10 years.   Colonoscopy 06/09/17: Normal colonoscopy. Excellent prep. Repeat colonoscopy in 5-10 years.  EGD 04/28/21: - Benign-appearing esophageal stenoses. Dilated. - Non-obstructing Schatzki ring. Biopsied. - Gastritis. Biopsied. - Normal examined duodenum. Biopsied. - Biopsies were taken with a cold forceps for histology in the proximal esophagus and in the distal esophagus. Path: 1. Surgical [P], duodenum - BENIGN DUODENAL MUCOSA - NO ACUTE INFLAMMATION, VILLOUS BLUNTING OR INCREASED INTRAEPITHELIAL LYMPHOCYTES IDENTIFIED 2. Surgical [P], gastric antrum and gastric body - REACTIVE GASTROPATHY - NO H. PYLORI OR INTESTINAL METAPLASIA IDENTIFIED - SEE COMMENT 3. Surgical [P], distal esophagus - BENIGN SQUAMOUS MUCOSA - NO INCREASED INTRAEPITHELIAL EOSINOPHILS 4. Surgical [P], proximal esophagus - BENIGN SQUAMOUS MUCOSA - NO INCREASED INTRAEPITHELIAL EOSINOPHILS  ASSESSMENT AND PLAN:  GERD Constipation Colon cancer screening Patient has been doing well in terms of her reflux control on daily rabeprazole therapy. She is constipated at a baseline but this may also be related to her Ozempic usage. Since patient's last colonoscopy was in 2019 and she was recommended for a 5 year follow up, she is currently due for a colonoscopy for colon cancer screening. Will get her scheduled and have her do some extra Miralax before her procedure to try to ensure that she will have a good bowel prep.  - Continue rabeprazole 20 mg QD, take 30 min before a meal - Start daily Metamucil - Colonoscopy [facility]. Plan for Sutab prep. Will hold Ozempic 1 week beforehand. Daily dose of Miralax starting 1 week beforehand. Zofran PRN to use before.  Eulah Pont, MD  I spent 34 minutes of time,  including in depth chart review, independent review of results as outlined above, communicating results with the patient directly, face-to-face time with the patient, coordinating care, and ordering studies and medications as appropriate, and documentation.

## 2022-08-24 NOTE — Patient Instructions (Addendum)
You have been scheduled for a colonoscopy. Please follow written instructions given to you at your visit today.  Please pick up your prep supplies at the pharmacy within the next 1-3 days. If you use inhalers (even only as needed), please bring them with you on the day of your procedure.   Start daily dose of Miralax starting 1 week beforehand. Use Zofran 30 minutes before prep before.   We have sent the following medications to your pharmacy for you to pick up at your convenience: Sutab  _______________________________________________________  If your blood pressure at your visit was 140/90 or greater, please contact your primary care physician to follow up on this.  _______________________________________________________  If you are age 60 or older, your body mass index should be between 23-30. Your Body mass index is 32.27 kg/m. If this is out of the aforementioned range listed, please consider follow up with your Primary Care Provider.  If you are age 39 or younger, your body mass index should be between 19-25. Your Body mass index is 32.27 kg/m. If this is out of the aformentioned range listed, please consider follow up with your Primary Care Provider.   ________________________________________________________  The [facility] providers would like to encourage you to use [facility] to communicate with providers for non-urgent requests or questions.  Due to long hold times on the telephone, sending your provider a message by [facility] may be a faster and more efficient way to get a response.  Please allow 48 business hours for a response.  Please remember that this is for non-urgent requests.  _______________________________________________________   Due to recent changes in healthcare laws, you may see the results of your imaging and laboratory studies on MyChart before your provider has had a chance to review them.  We understand that in some cases there may be results that are confusing or  concerning to you. Not all laboratory results come back in the same time frame and the provider may be waiting for multiple results in order to interpret others.  Please give [place] 48 hours in order for your provider to thoroughly review all the results before contacting the office for clarification of your results.    Thank you for entrusting me with your care and for choosing [facility], Dr. Eulah Pont

## 2022-08-30 DIAGNOSIS — M5451 Vertebrogenic low back pain: Secondary | ICD-10-CM | POA: Diagnosis not present

## 2022-08-30 DIAGNOSIS — F4311 Post-traumatic stress disorder, acute: Secondary | ICD-10-CM | POA: Diagnosis not present

## 2022-09-04 NOTE — Progress Notes (Unsigned)
Cardiology Office Note:    Date:  09/04/2022   ID:  Robyn Aguilar, DOB Sep 12, 1950, MRN [id number]  PCP:  Laurann Montana, MD   [facility] Providers Cardiologist:  Lesleigh Noe, MD (Inactive)   Referring MD: Laurann Montana, MD    History of Present Illness:    Robyn Aguilar is a 72 y.o. female with a hx of HFpEF, HLD, OSA on CPAP, prior TIAs, and DMII who was previously followed by Dr. Katrinka Blazing who now presents to clinic for follow-up.   Patient has been followed by Dr. Katrinka Blazing for HFpEF and HLD. TTE in 2020 with LVEF 60-65%, mild LVH, normal RV, mild PR. Was seen in 01/2021 for chest pain and ultimately underwent LHC on 02/22/21 which was negative for obstructive disease. She has been maintained on medical therapy.  Was last seen in clinic on 03/2022 where she was doing well at that time. Has chronic fatigue but no exertional symptoms.  Today, ***   Past Medical History:  Diagnosis Date   Allergic rhinitis    Anxiety    Arthritis    knees and neck   Cataracts, bilateral    immature    CHF (congestive heart failure) (HCC) 2014   after surgery   Complication of anesthesia    had heart failure after knee surgery 02/04/13   DDD (degenerative disc disease), lumbar    Diabetes mellitus without complication (HCC)    Dizziness    takes Antivert prn   GERD (gastroesophageal reflux disease)    takes Aciphex prn   High cholesterol    but doesn't take any meds   History of colon polyps    History of migraine    last one 6-71months ago;takes Topamax nightly   Hypertension    takes Metoprolol and maxzide daily   Joint pain    Joint swelling    Migraine    Migraines    Pneumonia    at age 7   Seizures (HCC)    hx of;last one 69yrs ago;no meds required;states it was brought on by med;only one ever had   Shortness of breath 02/06/2013   Sleep apnea    sleep study in epic from 2014-uses CPAP   Stroke (HCC)    2 TIA   TIA (transient ischemic  attack)     Past Surgical History:  Procedure Laterality Date   ABDOMINAL HYSTERECTOMY  1991   ANTERIOR LAT LUMBAR FUSION N/A 11/21/2019   Procedure: ANTERIOR LATERAL LUMBAR FUSION (XLIF) with lateral plate Lumbar two through lumbar three;  Surgeon: Venita Lick, MD;  Location: [facility] OR;  Service: Orthopedics;  Laterality: N/A;  3 hrs   carpel tunnel Right 1988   COLONOSCOPY     disc surgery  1996   ESOPHAGOGASTRODUODENOSCOPY     EYE SURGERY Bilateral    cataract surgery with lens implants   HERNIA REPAIR  1987   x 2   KNEE SURGERY Left 2011   replacement-partial   LEFT HEART CATH AND CORONARY ANGIOGRAPHY N/A 02/22/2021   Procedure: LEFT HEART CATH AND CORONARY ANGIOGRAPHY;  Surgeon: Yvonne Kendall, MD;  Location: [facility] INVASIVE CV LAB;  Service: Cardiovascular;  Laterality: N/A;   LUMBAR LAMINECTOMY/DECOMPRESSION MICRODISCECTOMY Right 07/13/2017   Procedure: Disectomy Right L2-3;  Surgeon: Venita Lick, MD;  Location: [facility] OR;  Service: Orthopedics;  Laterality: Right;  2.5 hrs   ROBOTIC ASSISTED BILATERAL SALPINGO OOPHERECTOMY Bilateral 10/22/2013   Procedure: ROBOTIC ASSISTED BILATERAL SALPINGO OOPHORECTOMY;  Surgeon: Rejeana Brock A. Duard Brady, MD;  Location: [facility] ORS;  Service: Gynecology;  Laterality: Bilateral;   ROTATOR CUFF REPAIR Right    TOTAL KNEE ARTHROPLASTY Right    TOTAL KNEE ARTHROPLASTY Right 02/04/2013   Procedure: RIGHT TOTAL KNEE ARTHROPLASTY;  Surgeon: Dannielle Huh, MD;  Location: [facility] OR;  Service: Orthopedics;  Laterality: Right;    Current Medications: No outpatient medications have been marked as taking for the 09/14/22 encounter (Appointment) with Meriam Sprague, MD.     Allergies:   Codeine, Statins, Ambien [zolpidem], Bempedoic acid, Budesonide, Crestor [rosuvastatin], Demerol [meperidine hcl], Edarbi [azilsartan], Fexofenadine, Olmesartan, Sulfamethoxazole-trimethoprim, Valsartan-hydrochlorothiazide, Welchol [colesevelam], Adhesive [tape], Latex, and Tapentadol    Social History   Socioeconomic History   Marital status: Married    Spouse name: Not on file   Number of children: 2   Years of education: college   Highest education level: Not on file  Occupational History    Employer: [employer]  Tobacco Use   Smoking status: Never   Smokeless tobacco: Never  Vaping Use   Vaping Use: Never used  Substance and Sexual Activity   Alcohol use: No    Alcohol/week: 0.0 standard drinks of alcohol   Drug use: No   Sexual activity: Yes    Birth control/protection: Surgical  Other Topics Concern   Not on file  Social History Narrative   ** Merged History Encounter **       Lives with husband Caffeine use:   Social Determinants of Corporate investment banker Strain: Not on file  Food Insecurity: Not on file  Transportation Needs: Not on file  Physical Activity: Not on file  Stress: Not on file  Social Connections: Not on file     Family History: The patient's family history includes Bone cancer in her maternal grandfather; Colon cancer in her maternal grandfather; Hypertension in her mother; Pancreatic cancer in her mother; Prostate cancer in an other family member; Sleep apnea in her brother, maternal uncle, and mother. There is no history of Stomach cancer or Esophageal cancer.  ROS:   Please see the history of present illness.     All other systems reviewed and are negative.  EKGs/Labs/Other Studies Reviewed:    The following studies were reviewed today: Cath 01/2021: Coronary angiography performed in October 2022: Diagnostic Dominance: Right Intervention Flowsheet Row Most Recent Value  AO Systolic Pressure 131 mmHg  AO Diastolic Pressure 72 mmHg  AO Mean 98 mmHg  LV Systolic Pressure 140 mmHg  LV Diastolic Pressure 8 mmHg  LV EDP 24 mmHg  AOp Systolic Pressure 137 mmHg  AOp Diastolic Pressure 78 mmHg  AOp Mean Pressure 105 mmHg  LVp Systolic Pressure 127 mmHg  LVp Diastolic Pressure 13 mmHg  LVp EDP  Pressure 17 mmHg   TTE 03/2019: IMPRESSIONS     1. Left ventricular ejection fraction, by visual estimation, is 60 to  65%. The left ventricle has normal function. Left ventricular septal wall  thickness was mildly increased. Mildly increased left ventricular  posterior wall thickness. There is mildly  increased left ventricular hypertrophy.   2. No evidece of hypertrophic cardiomyopathy.   3. Global right ventricle has normal systolic function.The right  ventricular size is normal. No increase in right ventricular wall  thickness.   4. Left atrial size was normal.   5. Right atrial size was normal.   6. The mitral valve is normal in structure. Trace mitral valve  regurgitation. No evidence of mitral stenosis.   7. The tricuspid valve is normal  in structure. Tricuspid valve  regurgitation is not demonstrated.   8. The aortic valve is tricuspid. Aortic valve regurgitation is not  visualized. No evidence of aortic valve sclerosis or stenosis.   9. The pulmonic valve was normal in structure. Pulmonic valve  regurgitation is mild.  10. The inferior vena cava is normal in size with greater than 50%  respiratory variability, suggesting right atrial pressure of 3 mmHg.   EKG:  EKG is  ordered today.  The ekg ordered today demonstrates NSR, RBBB, LAFB, HR 82  Recent Labs: 04/01/2022: ALT 36; BUN 20; Creatinine, Ser 0.90; Hemoglobin 15.6; Platelets 247; Potassium 3.3; Sodium 140  Recent Lipid Panel    Component Value Date/Time   CHOL 146 08/24/2020 0919   TRIG 109 08/24/2020 0919   HDL 54 08/24/2020 0919   CHOLHDL 2.7 08/24/2020 0919   CHOLHDL 10.6 02/07/2013 0650   VLDL 24 02/07/2013 0650   LDLCALC 72 08/24/2020 0919     Risk Assessment/Calculations:      No BP recorded.  {Refresh Note OR Click here to enter BP  :1}***         Physical Exam:    VS:  There were no vitals taken for this visit.    Wt Readings from Last 3 Encounters:  08/24/22 188 lb (85.3 kg)  06/30/22  187 lb (84.8 kg)  04/19/22 187 lb (84.8 kg)     GEN:  Well nourished, well developed in no acute distress HEENT: Normal NECK: No JVD; No carotid bruits CARDIAC: RRR, no murmurs, rubs, gallops RESPIRATORY:  Clear to auscultation without rales, wheezing or rhonchi  ABDOMEN: Soft, non-tender, non-distended MUSCULOSKELETAL:  No edema; No deformity  SKIN: Warm and dry NEUROLOGIC:  Alert and oriented x 3 PSYCHIATRIC:  Normal affect   ASSESSMENT:    No diagnosis found.  PLAN:    In order of problems listed above:  #Mild nonobstructive CAD: Cath 01/2021 with 30-40% distal RCA lesion. Currently, doing well without chest pain or SOB. Will continue with medical therapy as below. -Continue zetia 10mg  daily, repatha 140mg  q2 weeks -Continue ASA 81mg  daily  #Chronic Diastolic HF: #HFpEF: Remote history of acute HFpEF exacerbation following knee surgery. Last TTE 2020 with LVEF 60-65%, normal RV, mild PR. Currently euvolemic and compensated. -Continue jardiance 10mg  daily -Continue ozempic  -Low Na diet  #Fatigue: Has been ongoing for 4-5 years. TSH normal. Patient is not anemic. Has been compliant with CPAP for underlying OSA. Cath without significant CAD. Will trial changing her metop to the evening to see if this helps. Can always try changing the med in the future as well if needed.   #HTN: Well controlled and at goal <130/90. -Continue metop 50mg  XL daily (will trial at night) -Triamterene-HCTZ 75-50mg    #HLD: #Statin Intolerance: -Continue zetia 10mg  daily, repatha 140mg  q2 weeks -Follows with Pharm D  #History of TIAs: -Continue zetia 10mg  daily, repatha 140mg  q2 weeks -Continue ASA 81mg  daily  #Bifascicular Block: ECG with RBBB, LAFB. Asymptomatic at this time. Will monitor.  #OSA: Compliant with CPAP         Medication Adjustments/Labs and Tests Ordered: Current medicines are reviewed at length with the patient today.  Concerns regarding medicines are outlined  above.  No orders of the defined types were placed in this encounter.  No orders of the defined types were placed in this encounter.   There are no Patient Instructions on file for this visit.   Signed, Meriam Sprague, MD  09/04/2022 4:21  PM    [place]

## 2022-09-09 DIAGNOSIS — M5451 Vertebrogenic low back pain: Secondary | ICD-10-CM | POA: Diagnosis not present

## 2022-09-14 ENCOUNTER — Telehealth: Payer: Self-pay | Admitting: Pharmacist

## 2022-09-14 ENCOUNTER — Encounter: Payer: Self-pay | Admitting: Cardiology

## 2022-09-14 ENCOUNTER — Ambulatory Visit: Payer: Medicare PPO | Attending: Cardiology | Admitting: Cardiology

## 2022-09-14 VITALS — BP 131/82 | HR 77 | Ht 64.0 in | Wt 186.2 lb

## 2022-09-14 DIAGNOSIS — G4733 Obstructive sleep apnea (adult) (pediatric): Secondary | ICD-10-CM | POA: Diagnosis not present

## 2022-09-14 DIAGNOSIS — E782 Mixed hyperlipidemia: Secondary | ICD-10-CM

## 2022-09-14 DIAGNOSIS — I10 Essential (primary) hypertension: Secondary | ICD-10-CM | POA: Diagnosis not present

## 2022-09-14 DIAGNOSIS — E785 Hyperlipidemia, unspecified: Secondary | ICD-10-CM | POA: Diagnosis not present

## 2022-09-14 DIAGNOSIS — Z8673 Personal history of transient ischemic attack (TIA), and cerebral infarction without residual deficits: Secondary | ICD-10-CM | POA: Diagnosis not present

## 2022-09-14 DIAGNOSIS — E559 Vitamin D deficiency, unspecified: Secondary | ICD-10-CM | POA: Diagnosis not present

## 2022-09-14 DIAGNOSIS — Z7984 Long term (current) use of oral hypoglycemic drugs: Secondary | ICD-10-CM | POA: Diagnosis not present

## 2022-09-14 DIAGNOSIS — K219 Gastro-esophageal reflux disease without esophagitis: Secondary | ICD-10-CM | POA: Diagnosis not present

## 2022-09-14 DIAGNOSIS — I2510 Atherosclerotic heart disease of native coronary artery without angina pectoris: Secondary | ICD-10-CM | POA: Diagnosis not present

## 2022-09-14 DIAGNOSIS — I5032 Chronic diastolic (congestive) heart failure: Secondary | ICD-10-CM | POA: Diagnosis not present

## 2022-09-14 DIAGNOSIS — E118 Type 2 diabetes mellitus with unspecified complications: Secondary | ICD-10-CM | POA: Diagnosis not present

## 2022-09-14 DIAGNOSIS — I700 Atherosclerosis of aorta: Secondary | ICD-10-CM | POA: Diagnosis not present

## 2022-09-14 DIAGNOSIS — E1169 Type 2 diabetes mellitus with other specified complication: Secondary | ICD-10-CM | POA: Diagnosis not present

## 2022-09-14 DIAGNOSIS — E538 Deficiency of other specified B group vitamins: Secondary | ICD-10-CM | POA: Diagnosis not present

## 2022-09-14 DIAGNOSIS — E041 Nontoxic single thyroid nodule: Secondary | ICD-10-CM | POA: Diagnosis not present

## 2022-09-14 MED ORDER — METOPROLOL SUCCINATE ER 50 MG PO TB24: 50.0000 mg | ORAL_TABLET | Freq: Every day | ORAL | 3 refills | Status: AC

## 2022-09-14 MED ORDER — REPATHA SURECLICK 140 MG/ML ~~LOC~~ SOAJ: SUBCUTANEOUS | 11 refills | Status: DC

## 2022-09-14 MED ORDER — EZETIMIBE 10 MG PO TABS: ORAL_TABLET | ORAL | 3 refills | Status: DC

## 2022-09-14 NOTE — Telephone Encounter (Signed)
-----   Message from Loa Socks, LPN sent at 2/95/6213  9:46 AM EDT ----- Regarding: repatha refill Pt saw Dr. Shari Prows today, was a former Dr. Katrinka Blazing pt  She is needing a refill of her repatha.  Can you please assist with this?  Thanks ivy

## 2022-09-14 NOTE — Patient Instructions (Signed)
Medication Instructions:   Your physician recommends that you continue on your current medications as directed. Please refer to the Current Medication list given to you today.  *If you need a refill on your cardiac medications before your next appointment, please call your pharmacy*    Follow-Up: At [facility], you and your health needs are our priority.  As part of our continuing mission to provide you with exceptional heart care, we have created designated Provider Care Teams.  These Care Teams include your primary Cardiologist (physician) and Advanced Practice Providers (APPs -  Physician Assistants and Nurse Practitioners) who all work together to provide you with the care you need, when you need it.  We recommend signing up for the patient portal called "MyChart".  Sign up information is provided on this After Visit Summary.  MyChart is used to connect with patients for Virtual Visits (Telemedicine).  Patients are able to view lab/test results, encounter notes, upcoming appointments, etc.  Non-urgent messages can be sent to your provider as well.   To learn more about what you can do with MyChart, go to [email or web address].    Your next appointment:   6 month(s)  Provider:   Dr. Pemberton   

## 2022-09-16 DIAGNOSIS — M5451 Vertebrogenic low back pain: Secondary | ICD-10-CM | POA: Diagnosis not present

## 2022-09-20 ENCOUNTER — Telehealth: Payer: Self-pay

## 2022-09-20 NOTE — Telephone Encounter (Signed)
Received fax approval for Sutab from [facility] authorization is good until 05/02/23.

## 2022-09-28 DIAGNOSIS — M5451 Vertebrogenic low back pain: Secondary | ICD-10-CM | POA: Diagnosis not present

## 2022-09-30 DIAGNOSIS — F4311 Post-traumatic stress disorder, acute: Secondary | ICD-10-CM | POA: Diagnosis not present

## 2022-10-04 DIAGNOSIS — M5451 Vertebrogenic low back pain: Secondary | ICD-10-CM | POA: Diagnosis not present

## 2022-10-05 ENCOUNTER — Encounter: Payer: Medicare PPO | Admitting: Internal Medicine

## 2022-10-10 DIAGNOSIS — G43909 Migraine, unspecified, not intractable, without status migrainosus: Secondary | ICD-10-CM | POA: Diagnosis not present

## 2022-10-10 DIAGNOSIS — F419 Anxiety disorder, unspecified: Secondary | ICD-10-CM | POA: Diagnosis not present

## 2022-10-10 DIAGNOSIS — Z8679 Personal history of other diseases of the circulatory system: Secondary | ICD-10-CM | POA: Diagnosis not present

## 2022-10-10 DIAGNOSIS — E119 Type 2 diabetes mellitus without complications: Secondary | ICD-10-CM | POA: Diagnosis not present

## 2022-10-11 ENCOUNTER — Encounter: Payer: Self-pay | Admitting: Family Medicine

## 2022-10-11 ENCOUNTER — Other Ambulatory Visit: Payer: Self-pay | Admitting: Family Medicine

## 2022-10-11 DIAGNOSIS — S065XAA Traumatic subdural hemorrhage with loss of consciousness status unknown, initial encounter: Secondary | ICD-10-CM

## 2022-10-11 DIAGNOSIS — M5451 Vertebrogenic low back pain: Secondary | ICD-10-CM | POA: Diagnosis not present

## 2022-10-13 ENCOUNTER — Encounter: Payer: Self-pay | Admitting: Family Medicine

## 2022-10-13 DIAGNOSIS — M5451 Vertebrogenic low back pain: Secondary | ICD-10-CM | POA: Diagnosis not present

## 2022-10-18 ENCOUNTER — Other Ambulatory Visit: Payer: Medicare PPO

## 2022-10-19 ENCOUNTER — Ambulatory Visit
Admission: RE | Admit: 2022-10-19 | Discharge: 2022-10-19 | Disposition: A | Discharge status: Home / Self Care | Payer: Medicare PPO | Source: Ambulatory Visit | Attending: Family Medicine | Admitting: Family Medicine

## 2022-10-19 DIAGNOSIS — S065XAA Traumatic subdural hemorrhage with loss of consciousness status unknown, initial encounter: Secondary | ICD-10-CM

## 2022-10-19 DIAGNOSIS — R519 Headache, unspecified: Secondary | ICD-10-CM | POA: Diagnosis not present

## 2022-10-26 DIAGNOSIS — M5451 Vertebrogenic low back pain: Secondary | ICD-10-CM | POA: Diagnosis not present

## 2022-10-27 ENCOUNTER — Telehealth: Payer: Self-pay | Admitting: Neurology

## 2022-10-27 ENCOUNTER — Ambulatory Visit: Payer: Medicare PPO | Admitting: Neurology

## 2022-10-27 ENCOUNTER — Encounter: Payer: Self-pay | Admitting: Neurology

## 2022-10-27 VITALS — BP 122/73 | HR 81 | Ht 64.0 in | Wt 184.0 lb

## 2022-10-27 DIAGNOSIS — F0781 Postconcussional syndrome: Secondary | ICD-10-CM | POA: Diagnosis not present

## 2022-10-27 DIAGNOSIS — J9601 Acute respiratory failure with hypoxia: Secondary | ICD-10-CM | POA: Diagnosis not present

## 2022-10-27 DIAGNOSIS — G250 Essential tremor: Secondary | ICD-10-CM

## 2022-10-27 DIAGNOSIS — S065XAA Traumatic subdural hemorrhage with loss of consciousness status unknown, initial encounter: Secondary | ICD-10-CM | POA: Diagnosis not present

## 2022-10-27 DIAGNOSIS — G44001 Cluster headache syndrome, unspecified, intractable: Secondary | ICD-10-CM

## 2022-10-27 DIAGNOSIS — R569 Unspecified convulsions: Secondary | ICD-10-CM | POA: Diagnosis not present

## 2022-10-27 DIAGNOSIS — G43011 Migraine without aura, intractable, with status migrainosus: Secondary | ICD-10-CM

## 2022-10-27 MED ORDER — BUTALBITAL-APAP-CAFFEINE 50-325-40 MG PO TABS: 1.0000 | ORAL_TABLET | ORAL | 0 refills | Status: DC | PRN

## 2022-10-27 MED ORDER — MEPERIDINE HCL 50 MG PO TABS: 50.0000 mg | ORAL_TABLET | Freq: Four times a day (QID) | ORAL | 0 refills | Status: AC | PRN

## 2022-10-27 MED ORDER — GALCANEZUMAB-GNLM 120 MG/ML ~~LOC~~ SOAJ: 120.0000 mg | Freq: Once | SUBCUTANEOUS | Status: DC

## 2022-10-27 MED ORDER — EMGALITY 120 MG/ML ~~LOC~~ SOAJ: 240.0000 mg | Freq: Once | SUBCUTANEOUS | 0 refills | Status: AC

## 2022-10-27 MED ORDER — AMITRIPTYLINE HCL 25 MG PO TABS: 25.0000 mg | ORAL_TABLET | Freq: Every day | ORAL | 3 refills | Status: DC

## 2022-10-27 NOTE — Telephone Encounter (Signed)
Dr Dohmeier requested the pt get Emgality sample at todays visit.

## 2022-10-27 NOTE — Patient Instructions (Signed)
1) SDH/  Bleeding , and since  having exacerbation of headaches at NIGHT, CLUSTER, sharp , stabbing, debilitating headaches.  2)  Butalbutal - for pain up to 6 times a day-   demerol has helped, Bernita Raisin is not helping effexor will not help,  steroids may help.  Use the CPAP.  Start  on amitriptyline 25 mg at night po,  Emgality subcutaneus.   3) Cluster Headache Cluster headaches hurt a lot. They normally happen on one side of your head or face, but they may switch sides. Often, cluster headaches: Cause a lot of pain. Happen often for weeks to months. Last from 15 minutes to 3 hours. Happen at the same time each day, often at night. Happen many times a day. Happen in the fall and springtime. What are the causes? The exact cause is not known. They are not usually caused by foods, changes in body chemicals (hormonal changes), or stress. What increases the risk? Being a female between the ages of 7-70 years old. Smoking or using products that contain nicotine or tobacco. Having high levels of body chemical called histamine. This can happen in people who have allergies. Taking certain medicines that cause blood vessels to get bigger. Having a parent or sibling who has cluster headaches. What are the signs or symptoms? Very bad pain on one side of the head that begins behind or around your eye but may spread to your face, head, and neck. Feeling like you may vomit (nauseous). Being sensitive to light. A runny nose and stuffy nose. Sweating on the forehead or face on the side where the pain is. Eye problems. This might include a droopy or swollen eyelid, eye redness, or tearing on the side where the pain is. Feeling restless or upset. Pale skin or a red face (flushed). How is this treated? Medicines. Oxygen that is breathed in through a mask. A steroid shot (injection) in the back of the head just above the neck (occipital nerve block). This shot numbs painful areas in the head. Surgery in  very bad cases. Follow these instructions at home: Headache diary Keep a headache diary. Doing this can help you and your doctor find out what triggers your headaches. In the diary, include: The time of day that your headache started and what you were doing when it began. How long your headache lasted. Where your pain started and if it moved to other areas. The type of pain. Your level of pain. Use a pain scale and rate the pain with a number from 1 (mild) up to 10 (very bad). The treatment that you used, and any change in symptoms after treatment. Medicines Take over-the-counter and prescription medicines only as told by your doctor. If told, take steps to prevent problems with pooping (constipation). You may need to: Drink enough fluid to keep your pee (urine) pale yellow. Take medicines. You will be told what medicines to take. Eat foods that are high in fiber. These include beans, whole grains, and fresh fruits and vegetables. Limit foods that are high in fat and sugar. These include fried or sweet foods. Ask your doctor if you should avoid driving or using machines while you are taking your medicine. Lifestyle Get 7-9 hours of sleep each night, or the amount recommended by your doctor. Limit or manage stress. Options may include acupuncture, counseling, and massage. Exercise regularly. Exercise for at least 30 minutes, 5 times each week. Eat a healthy diet. Avoid any foods that you know may trigger your  headaches. Do not drink alcohol. Do not smoke or use any products that contain nicotine or tobacco. If you need help quitting, ask your doctor. General instructions Use oxygen as told by your doctor. Contact a doctor if: Your headaches: Change. Get worse. Happen more often. Your medicines or oxygen are not helping. Get help right away if: You faint. You have weakness or lose feeling (have numbness) on one side of your body or face. You see two of everything (double  vision). You vomit or feel you may vomit (nauseous), and it does not stop after many hours. You have trouble with your balance or with walking. You have trouble talking. You have neck pain or stiffness and you have a fever. This information is not intended to replace advice given to you by your health care provider. Make sure you discuss any questions you have with your health care provider. Document Revised: 01/06/2022 Document Reviewed: 12/13/2021 Elsevier Patient Education  2024 [employer].

## 2022-10-27 NOTE — Telephone Encounter (Signed)
Gave the patient the Emgality preloading dose of 240 mg in office. 2 injections 172ml/ml were given in the LLQ abdomen. Pt was educated on how to administer. States her husband will give at home. She verbalized understanding. Tolerated the injection well and had no other question or concerns.
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
## 2022-10-28 DIAGNOSIS — M5451 Vertebrogenic low back pain: Secondary | ICD-10-CM | POA: Diagnosis not present

## 2022-10-30 DIAGNOSIS — F4311 Post-traumatic stress disorder, acute: Secondary | ICD-10-CM | POA: Diagnosis not present

## 2022-11-01 ENCOUNTER — Ambulatory Visit (AMBULATORY_SURGERY_CENTER): Payer: Medicare PPO | Admitting: Internal Medicine

## 2022-11-01 ENCOUNTER — Encounter: Payer: Self-pay | Admitting: Internal Medicine

## 2022-11-01 VITALS — BP 155/90 | HR 71 | Temp 97.7°F | Resp 7 | Ht 64.0 in | Wt 188.0 lb

## 2022-11-01 DIAGNOSIS — D122 Benign neoplasm of ascending colon: Secondary | ICD-10-CM | POA: Diagnosis not present

## 2022-11-01 DIAGNOSIS — F419 Anxiety disorder, unspecified: Secondary | ICD-10-CM | POA: Diagnosis not present

## 2022-11-01 DIAGNOSIS — I509 Heart failure, unspecified: Secondary | ICD-10-CM | POA: Diagnosis not present

## 2022-11-01 DIAGNOSIS — Z1211 Encounter for screening for malignant neoplasm of colon: Secondary | ICD-10-CM | POA: Diagnosis not present

## 2022-11-01 DIAGNOSIS — E7800 Pure hypercholesterolemia, unspecified: Secondary | ICD-10-CM | POA: Diagnosis not present

## 2022-11-01 DIAGNOSIS — K635 Polyp of colon: Secondary | ICD-10-CM | POA: Diagnosis not present

## 2022-11-01 DIAGNOSIS — E119 Type 2 diabetes mellitus without complications: Secondary | ICD-10-CM | POA: Diagnosis not present

## 2022-11-01 DIAGNOSIS — D123 Benign neoplasm of transverse colon: Secondary | ICD-10-CM

## 2022-11-01 DIAGNOSIS — G4730 Sleep apnea, unspecified: Secondary | ICD-10-CM | POA: Diagnosis not present

## 2022-11-01 MED ORDER — SODIUM CHLORIDE 0.9 % IV SOLN: 500.0000 mL | Freq: Once | INTRAVENOUS | Status: DC

## 2022-11-01 NOTE — Progress Notes (Signed)
To pacu, VSS. Report to [email or web address] 

## 2022-11-01 NOTE — Op Note (Signed)
[facility] Patient Name: Robyn Aguilar Procedure Date: 11/01/2022 9:03 AM MRN: [id number] Endoscopist: Madelyn Brunner [place] , , [id number] Age: 72 Referring MD:  Date of Birth: 1950/07/04 Gender: Female Account #: [id number] Procedure:                Colonoscopy Indications:              Screening for colorectal malignant neoplasm Medicines:                Monitored Anesthesia Care Procedure:                Pre-Anesthesia Assessment:                           - Prior to the procedure, a History and Physical                            was performed, and patient medications and                            allergies were reviewed. The patient's tolerance of                            previous anesthesia was also reviewed. The risks                            and benefits of the procedure and the sedation                            options and risks were discussed with the patient.                            All questions were answered, and informed consent                            was obtained. Prior Anticoagulants: The patient has                            taken no anticoagulant or antiplatelet agents. ASA                            Grade Assessment: III - A patient with severe                            systemic disease. After reviewing the risks and                            benefits, the patient was deemed in satisfactory                            condition to undergo the procedure.                           After obtaining informed consent, the colonoscope  was passed under direct vision. Throughout the                            procedure, the patient's blood pressure, pulse, and                            oxygen saturations were monitored continuously. The                            Olympus Scope SN: [id number] was introduced through                            the anus and advanced to the the terminal ileum.                             The colonoscopy was performed without difficulty.                            The patient tolerated the procedure well. The                            quality of the bowel preparation was excellent. The                            terminal ileum, ileocecal valve, appendiceal                            orifice, and rectum were photographed. Scope In: 9:23:52 AM Scope Out: 9:46:51 AM Scope Withdrawal Time: 0 hours 16 minutes 19 seconds  Total Procedure Duration: 0 hours 22 minutes 59 seconds  Findings:                 The terminal ileum appeared normal.                           Seven sessile polyps were found in the transverse                            colon and ascending colon. The polyps were 3 to 6                            mm in size. These polyps were removed with a cold                            snare. Resection and retrieval were complete.                           A few diverticula were found in the sigmoid colon                            and ascending colon.                           Non-bleeding internal hemorrhoids were found during  retroflexion. Complications:            No immediate complications. Estimated Blood Loss:     Estimated blood loss was minimal. Impression:               - The examined portion of the ileum was normal.                           - Seven 3 to 6 mm polyps in the transverse colon                            and in the ascending colon, removed with a cold                            snare. Resected and retrieved.                           - Diverticulosis in the sigmoid colon and in the                            ascending colon.                           - Non-bleeding internal hemorrhoids. Recommendation:           - Discharge patient to home (with escort).                           - Await pathology results.                           - The findings and recommendations were discussed                            with the  patient. Dr Particia Lather "Alan Ripper" Leonides Schanz,  11/01/2022 9:53:16 AM

## 2022-11-01 NOTE — Patient Instructions (Signed)
Please read handouts provided Await pathology results.   YOU HAD AN ENDOSCOPIC PROCEDURE TODAY AT THE [facility]:   Refer to the procedure report that was given to you for any specific questions about what was found during the examination.  If the procedure report does not answer your questions, please call your gastroenterologist to clarify.  If you requested that your care partner not be given the details of your procedure findings, then the procedure report has been included in a sealed envelope for you to review at your convenience later.  YOU SHOULD EXPECT: Some feelings of bloating in the abdomen. Passage of more gas than usual.  Walking can help get rid of the air that was put into your GI tract during the procedure and reduce the bloating. If you had a lower endoscopy (such as a colonoscopy or flexible sigmoidoscopy) you may notice spotting of blood in your stool or on the toilet paper. If you underwent a bowel prep for your procedure, you may not have a normal bowel movement for a few days.  Please Note:  You might notice some irritation and congestion in your nose or some drainage.  This is from the oxygen used during your procedure.  There is no need for concern and it should clear up in a day or so.  SYMPTOMS TO REPORT IMMEDIATELY:  Following lower endoscopy (colonoscopy or flexible sigmoidoscopy):  Excessive amounts of blood in the stool  Significant tenderness or worsening of abdominal pains  Swelling of the abdomen that is new, acute  Fever of 100F or higher  For urgent or emergent issues, a gastroenterologist can be reached at any hour by calling [phone]. Do not use MyChart messaging for urgent concerns.    DIET:  We do recommend a small meal at first, but then you may proceed to your regular diet.  Drink plenty of fluids but you should avoid alcoholic beverages for 24 hours.  ACTIVITY:  You should plan to take it easy for the rest of today and you should  NOT DRIVE or use heavy machinery until tomorrow (because of the sedation medicines used during the test).    FOLLOW UP: Our staff will call the number listed on your records the next business day following your procedure.  We will call around 7:15- 8:00 am to check on you and address any questions or concerns that you may have regarding the information given to you following your procedure. If we do not reach you, we will leave a message.     If any biopsies were taken you will be contacted by phone or by letter within the next 1-3 weeks.  Please call us at [phone] if you have not heard about the biopsies in 3 weeks.    SIGNATURES/CONFIDENTIALITY: You and/or your care partner have signed paperwork which will be entered into your electronic medical record.  These signatures attest to the fact that that the information above on your After Visit Summary has been reviewed and is understood.  Full responsibility of the confidentiality of this discharge information lies with you and/or your care-partner. 

## 2022-11-01 NOTE — Progress Notes (Signed)
GASTROENTEROLOGY PROCEDURE H&P NOTE   Primary Care Physician: Laurann Montana, MD    Reason for Procedure:   Colon cancer screening  Plan:    Colonoscopy  Patient is appropriate for endoscopic procedure(s) in the ambulatory ([facility]) setting.  The nature of the procedure, as well as the risks, benefits, and alternatives were carefully and thoroughly reviewed with the patient. Ample time for discussion and questions allowed. The patient understood, was satisfied, and agreed to proceed.     HPI: Robyn Aguilar is a 72 y.o. female who presents for colonoscopy for evaluation of colon cancer screening .  Patient was most recently seen in the Gastroenterology Clinic on 08/24/22.  No interval change in medical history since that appointment. Please refer to that note for full details regarding GI history and clinical presentation.   Past Medical History:  Diagnosis Date   Allergic rhinitis    Anxiety    Arthritis    knees and neck   Cataracts, bilateral    immature    CHF (congestive heart failure) (HCC) 2014   after surgery   Complication of anesthesia    had heart failure after knee surgery 02/04/13   DDD (degenerative disc disease), lumbar    Diabetes mellitus without complication (HCC)    Dizziness    takes Antivert prn   GERD (gastroesophageal reflux disease)    takes Aciphex prn   High cholesterol    but doesn't take any meds   History of colon polyps    History of migraine    last one 6-59months ago;takes Topamax nightly   Hypertension    takes Metoprolol and maxzide daily   Joint pain    Joint swelling    Migraine    Migraines    Pneumonia    at age 14   Seizures (HCC)    hx of;last one 34yrs ago;no meds required;states it was brought on by med;only one ever had   Shortness of breath 02/06/2013   Sleep apnea    sleep study in epic from 2014-uses CPAP   Stroke (HCC)    2 TIA   TIA (transient ischemic attack)     Past Surgical History:  Procedure  Laterality Date   ABDOMINAL HYSTERECTOMY  1991   ANTERIOR LAT LUMBAR FUSION N/A 11/21/2019   Procedure: ANTERIOR LATERAL LUMBAR FUSION (XLIF) with lateral plate Lumbar two through lumbar three;  Surgeon: Venita Lick, MD;  Location: [facility] OR;  Service: Orthopedics;  Laterality: N/A;  3 hrs   carpel tunnel Right 1988   COLONOSCOPY     disc surgery  1996   ESOPHAGOGASTRODUODENOSCOPY     EYE SURGERY Bilateral    cataract surgery with lens implants   HERNIA REPAIR  1987   x 2   KNEE SURGERY Left 2011   replacement-partial   LEFT HEART CATH AND CORONARY ANGIOGRAPHY N/A 02/22/2021   Procedure: LEFT HEART CATH AND CORONARY ANGIOGRAPHY;  Surgeon: Yvonne Kendall, MD;  Location: [facility];  Service: Cardiovascular;  Laterality: N/A;   LUMBAR LAMINECTOMY/DECOMPRESSION MICRODISCECTOMY Right 07/13/2017   Procedure: Disectomy Right L2-3;  Surgeon: Venita Lick, MD;  Location: [facility] OR;  Service: Orthopedics;  Laterality: Right;  2.5 hrs   ROBOTIC ASSISTED BILATERAL SALPINGO OOPHERECTOMY Bilateral 10/22/2013   Procedure: ROBOTIC ASSISTED BILATERAL SALPINGO OOPHORECTOMY;  Surgeon: Rejeana Brock A. Duard Brady, MD;  Location: [facility] ORS;  Service: Gynecology;  Laterality: Bilateral;   ROTATOR CUFF REPAIR Right    TOTAL KNEE ARTHROPLASTY Right    TOTAL KNEE ARTHROPLASTY  Right 02/04/2013   Procedure: RIGHT TOTAL KNEE ARTHROPLASTY;  Surgeon: Dannielle Huh, MD;  Location: [facility] OR;  Service: Orthopedics;  Laterality: Right;    Prior to Admission medications   Medication Sig Start Date End Date Taking? Authorizing Provider  Accu-Chek Softclix Lancets lancets USE TO TEST EVERY DAY 10/14/20  Yes [provider]  aspirin EC 81 MG tablet Take 81 mg by mouth daily.   Yes [provider]  cetirizine (ZYRTEC) 10 MG tablet Take 10 mg by mouth daily.  11/05/14  Yes [provider]  cholecalciferol (VITAMIN D3) 25 MCG (1000 UNIT) tablet Take 1,000 Units by mouth daily.   Yes [provider]   empagliflozin (JARDIANCE) 10 MG TABS tablet Take 1 tablet (10 mg total) by mouth daily before breakfast. 02/24/21  Yes Lyn Records, MD  Evolocumab (REPATHA SURECLICK) 140 MG/ML SOAJ INJECT 1 PEN INTO THE SKIN EVERY 14 DAYS 09/14/22  Yes Meriam Sprague, MD  ezetimibe (ZETIA) 10 MG tablet TAKE 1 TABLET(10 MG) BY MOUTH DAILY 09/14/22  Yes Meriam Sprague, MD  fluticasone (FLONASE) 50 MCG/ACT nasal spray Place 1-2 sprays into both nostrils at bedtime. As needed 08/26/14  Yes [provider]  glucose blood (ACCU-CHEK AVIVA PLUS) test strip TEST EVERY DAY AS DIRECTED 10/10/20  Yes [provider]  metoprolol succinate (TOPROL-XL) 50 MG 24 hr tablet Take 1 tablet (50 mg total) by mouth daily. Take with or immediately following a meal. 09/14/22  Yes Pemberton, Kathlynn Grate, MD  ondansetron (ZOFRAN) 4 MG tablet Take 1 tablet (4 mg total) by mouth every 8 (eight) hours as needed for nausea or vomiting. 08/24/22  Yes Imogene Burn, MD  RABEprazole (ACIPHEX) 20 MG tablet Take 20 mg by mouth every other day.   Yes [provider]  Sodium Sulfate-Mag Sulfate-KCl (SUTAB) [phone] MG TABS Use as directed for colonoscopy. MANUFACTURER CODES!! BIN: [id number] PCN: CN GROUP: [id number] MEMBER ID: [id number];[employer] AS SECONDARY INSURANCE ;NO PRIOR AUTHORIZATION 08/24/22  Yes Imogene Burn, MD  triamterene-hydrochlorothiazide (MAXZIDE) 75-50 MG tablet Take 0.5 tablets by mouth daily. Pt take one half tablet daily 01/29/15  Yes [provider]  acetaminophen (TYLENOL) 650 MG CR tablet Take 1,300 mg by mouth every 8 (eight) hours as needed for pain.    [provider]  AMBULATORY NON FORMULARY MEDICATION Medication Name: 90 ml viscous lidocaine:90 ml 10mg /13ml Dicyclomine:270 ml Maalox - 10 ml every 4-6 hours as needed. 07/14/21   Imogene Burn, MD  amitriptyline (ELAVIL) 25 MG tablet Take 1 tablet (25 mg total) by mouth at bedtime. 10/27/22   Dohmeier, Porfirio Mylar, MD   butalbital-acetaminophen-caffeine (FIORICET) [phone] MG tablet Take 1 tablet by mouth every 4 (four) hours as needed for headache or migraine. 10/27/22   Dohmeier, Porfirio Mylar, MD  desonide (DESOWEN) 0.05 % cream Apply 1 application  topically as needed. Patient not taking: Reported on 10/27/2022 06/30/21   [provider]  lidocaine (LIDODERM) 5 % Place 1 patch onto the skin daily as needed (pain). 11/18/20   [provider]  LORazepam (ATIVAN) 0.5 MG tablet Take 0.25-0.5 mg by mouth daily as needed for anxiety. 01/09/19   [provider]  nitroGLYCERIN (NITROSTAT) 0.4 MG SL tablet DISSOLVE 1 TABLET UNDER THE TONGUE EVERY 5 MINUTES AS NEEDED FOR CHEST PAIN. TAKE UP TO 3 TABLETS 05/03/22   Meriam Sprague, MD  Semaglutide,0.25 or 0.5MG /DOS, (OZEMPIC, 0.25 OR 0.5 MG/DOSE,) 2 MG/1.5ML SOPN Inject 0.25 mg into the skin every Sunday.  [provider]    Current Outpatient Medications  Medication Sig Dispense Refill   Accu-Chek Softclix Lancets lancets USE TO TEST EVERY DAY     aspirin EC 81 MG tablet Take 81 mg by mouth daily.     cetirizine (ZYRTEC) 10 MG tablet Take 10 mg by mouth daily.      cholecalciferol (VITAMIN D3) 25 MCG (1000 UNIT) tablet Take 1,000 Units by mouth daily.     empagliflozin (JARDIANCE) 10 MG TABS tablet Take 1 tablet (10 mg total) by mouth daily before breakfast. 90 tablet 3   Evolocumab (REPATHA SURECLICK) 140 MG/ML SOAJ INJECT 1 PEN INTO THE SKIN EVERY 14 DAYS 2 mL 11   ezetimibe (ZETIA) 10 MG tablet TAKE 1 TABLET(10 MG) BY MOUTH DAILY 90 tablet 3   fluticasone (FLONASE) 50 MCG/ACT nasal spray Place 1-2 sprays into both nostrils at bedtime. As needed     glucose blood (ACCU-CHEK AVIVA PLUS) test strip TEST EVERY DAY AS DIRECTED     metoprolol succinate (TOPROL-XL) 50 MG 24 hr tablet Take 1 tablet (50 mg total) by mouth daily. Take with or immediately following a meal. 90 tablet 3   ondansetron (ZOFRAN) 4 MG tablet Take 1 tablet (4 mg total)  by mouth every 8 (eight) hours as needed for nausea or vomiting. 4 tablet 0   RABEprazole (ACIPHEX) 20 MG tablet Take 20 mg by mouth every other day.     Sodium Sulfate-Mag Sulfate-KCl (SUTAB) [phone] MG TABS Use as directed for colonoscopy. MANUFACTURER CODES!! BIN: [id number] PCN: CN GROUP: [id number] MEMBER ID: [id number];[employer] AS SECONDARY INSURANCE ;NO PRIOR AUTHORIZATION 24 tablet 0   triamterene-hydrochlorothiazide (MAXZIDE) 75-50 MG tablet Take 0.5 tablets by mouth daily. Pt take one half tablet daily  1   acetaminophen (TYLENOL) 650 MG CR tablet Take 1,300 mg by mouth every 8 (eight) hours as needed for pain.     AMBULATORY NON FORMULARY MEDICATION Medication Name: 90 ml viscous lidocaine:90 ml 10mg /74ml Dicyclomine:270 ml Maalox - 10 ml every 4-6 hours as needed. 450 mL 1   amitriptyline (ELAVIL) 25 MG tablet Take 1 tablet (25 mg total) by mouth at bedtime. 30 tablet 3   butalbital-acetaminophen-caffeine (FIORICET) 50-325-40 MG tablet Take 1 tablet by mouth every 4 (four) hours as needed for headache or migraine. 30 tablet 0   desonide (DESOWEN) 0.05 % cream Apply 1 application  topically as needed. (Patient not taking: Reported on 10/27/2022)     lidocaine (LIDODERM) 5 % Place 1 patch onto the skin daily as needed (pain).     LORazepam (ATIVAN) 0.5 MG tablet Take 0.25-0.5 mg by mouth daily as needed for anxiety.     nitroGLYCERIN (NITROSTAT) 0.4 MG SL tablet DISSOLVE 1 TABLET UNDER THE TONGUE EVERY 5 MINUTES AS NEEDED FOR CHEST PAIN. TAKE UP TO 3 TABLETS 25 tablet 3   Semaglutide,0.25 or 0.5MG /DOS, (OZEMPIC, 0.25 OR 0.5 MG/DOSE,) 2 MG/1.5ML SOPN Inject 0.25 mg into the skin every Sunday.     Current Facility-Administered Medications  Medication Dose Route Frequency Provider Last Rate Last Admin   0.9 %  sodium chloride infusion  500 mL Intravenous Once Imogene Burn, MD       Galcanezumab-gnlm SOAJ 120 mg  120 mg Subcutaneous Once Dohmeier, Porfirio Mylar, MD        Allergies as of 11/01/2022 -  Review Complete 11/01/2022  Allergen Reaction Noted   Codeine Hives 09/12/2012   Statins Other (See Comments) 06/26/2017   Ambien [zolpidem] Other (See Comments) 08/03/2020  Bempedoic acid  01/08/2020   Budesonide  08/03/2020   Crestor [rosuvastatin]  08/03/2020   Demerol [meperidine hcl]  08/03/2020   Edarbi [azilsartan]  08/03/2020   Fexofenadine  08/03/2020   Olmesartan  08/03/2020   Sulfamethoxazole-trimethoprim  08/03/2020   Valsartan-hydrochlorothiazide  08/03/2020   Welchol [colesevelam]  08/03/2020   Adhesive [tape] Rash 10/15/2013   Latex Rash 03/08/2013   Tapentadol Rash 10/15/2013    Family History  Problem Relation Age of Onset   Hypertension Mother    Pancreatic cancer Mother    Sleep apnea Mother    Sleep apnea Brother    Colon cancer Maternal Grandfather    Bone cancer Maternal Grandfather    Sleep apnea Maternal Uncle    Prostate cancer Other    Stomach cancer Neg Hx    Esophageal cancer Neg Hx     Social History   Socioeconomic History   Marital status: Married    Spouse name: Not on file   Number of children: 2   Years of education: college   Highest education level: Not on file  Occupational History    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  Tobacco Use   Smoking status: Never   Smokeless tobacco: Never  Vaping Use   Vaping Use: Never used  Substance and Sexual Activity   Alcohol use: No    Alcohol/week: 0.0 standard drinks of alcohol   Drug use: No   Sexual activity: Yes    Birth control/protection: Surgical  Other Topics Concern   Not on file  Social History Narrative   ** Merged History Encounter **       Lives with husband Caffeine use:   Social Determinants of Corporate investment banker Strain: Not on file  Food Insecurity: Not on file  Transportation Needs: Not on file  Physical Activity: Not on file  Stress: Not on file  Social Connections: Not on file  Intimate Partner Violence: Not on file    Physical Exam: Vital signs  in last 24 hours: BP (!) 146/85   Pulse 69   Temp 97.7 F (36.5 C)   Ht 5\' 4"  (1.626 m)   Wt 188 lb (85.3 kg)   SpO2 97%   BMI 32.27 kg/m  GEN: NAD EYE: Sclerae anicteric ENT: MMM CV: Non-tachycardic Pulm: No increased WOB GI: Soft NEURO:  Alert & Oriented   Eulah Pont, MD [facility]   11/01/2022 9:04 AM

## 2022-11-01 NOTE — Progress Notes (Signed)
Called to room to assist during endoscopic procedure.  Patient ID and intended procedure confirmed with present staff. Received instructions for my participation in the procedure from the performing physician.  

## 2022-11-02 ENCOUNTER — Telehealth: Payer: Self-pay | Admitting: *Deleted

## 2022-11-02 NOTE — Telephone Encounter (Signed)
  Follow up Call-     11/01/2022    8:59 AM 04/28/2021    7:14 AM  Call back number  Post procedure Call Back phone  # [phone]  Permission to leave phone message Yes Yes     Patient questions:  Do you have a fever, pain , or abdominal swelling? No. Pain Score  0 *  Have you tolerated food without any problems? Yes.    Have you been able to return to your normal activities? Yes.    Do you have any questions about your discharge instructions: Diet   No. Medications  No. Follow up visit  No.  Do you have questions or concerns about your Care? No.  Actions: * If pain score is 4 or above: No action needed, pain <4.

## 2022-11-07 ENCOUNTER — Encounter: Payer: Self-pay | Admitting: Internal Medicine

## 2022-11-08 DIAGNOSIS — M5451 Vertebrogenic low back pain: Secondary | ICD-10-CM | POA: Diagnosis not present

## 2022-11-15 ENCOUNTER — Other Ambulatory Visit: Payer: Self-pay | Admitting: Neurology

## 2022-11-15 ENCOUNTER — Telehealth: Payer: Self-pay | Admitting: Neurology

## 2022-11-15 MED ORDER — EMGALITY 120 MG/ML ~~LOC~~ SOAJ: 120.0000 mg | SUBCUTANEOUS | 5 refills | Status: DC

## 2022-11-15 NOTE — Telephone Encounter (Signed)
Medication approved for the patient until 05/02/2023

## 2022-11-15 NOTE — Telephone Encounter (Signed)
PA completed on [facility] OAC:[id number] Will await determination

## 2022-11-15 NOTE — Telephone Encounter (Signed)
Order sent will begin a PA for the pt

## 2022-11-15 NOTE — Telephone Encounter (Signed)
Pt stated prescription for Emgality was never sent to [facility]

## 2022-11-29 DIAGNOSIS — I5032 Chronic diastolic (congestive) heart failure: Secondary | ICD-10-CM | POA: Diagnosis not present

## 2022-11-29 DIAGNOSIS — I700 Atherosclerosis of aorta: Secondary | ICD-10-CM | POA: Diagnosis not present

## 2022-11-29 DIAGNOSIS — E119 Type 2 diabetes mellitus without complications: Secondary | ICD-10-CM | POA: Diagnosis not present

## 2022-11-29 DIAGNOSIS — E785 Hyperlipidemia, unspecified: Secondary | ICD-10-CM | POA: Diagnosis not present

## 2022-11-29 DIAGNOSIS — G720 Drug-induced myopathy: Secondary | ICD-10-CM | POA: Diagnosis not present

## 2022-11-29 DIAGNOSIS — F419 Anxiety disorder, unspecified: Secondary | ICD-10-CM | POA: Diagnosis not present

## 2022-11-29 DIAGNOSIS — I110 Hypertensive heart disease with heart failure: Secondary | ICD-10-CM | POA: Diagnosis not present

## 2022-11-29 DIAGNOSIS — L989 Disorder of the skin and subcutaneous tissue, unspecified: Secondary | ICD-10-CM | POA: Diagnosis not present

## 2022-11-29 DIAGNOSIS — E1169 Type 2 diabetes mellitus with other specified complication: Secondary | ICD-10-CM | POA: Diagnosis not present

## 2022-11-29 DIAGNOSIS — I2510 Atherosclerotic heart disease of native coronary artery without angina pectoris: Secondary | ICD-10-CM | POA: Diagnosis not present

## 2022-11-30 DIAGNOSIS — F4311 Post-traumatic stress disorder, acute: Secondary | ICD-10-CM | POA: Diagnosis not present

## 2022-12-01 DIAGNOSIS — M5451 Vertebrogenic low back pain: Secondary | ICD-10-CM | POA: Diagnosis not present

## 2022-12-08 ENCOUNTER — Telehealth: Payer: Self-pay | Admitting: Neurology

## 2022-12-08 DIAGNOSIS — G4733 Obstructive sleep apnea (adult) (pediatric): Secondary | ICD-10-CM

## 2022-12-08 NOTE — Telephone Encounter (Signed)
Returned patient call to informed her that she is qualify for a new CPAP machine since she had it more than five years. It looks like the CPap is helping with her sleep apnea. I will place an order for the new Cpap machine and inform Dr Vickey Huger. Patient mention she didn't want an new sleep study done because the setting are fine with her. Pt verbalized understanding. Pt had no questions at this time but was encouraged to call back if questions arise.

## 2022-12-08 NOTE — Addendum Note (Signed)
Addended by: Judi Cong on: 12/08/2022 12:36 PM   Modules accepted: Orders

## 2022-12-08 NOTE — Telephone Encounter (Signed)
Pt said, CPAP machine has given out. Spoke with [facility], they told me to notify they will be getting in touch with you. They need an order to issue a new CPAP. They are giving me a loaner for a night. Will need order as soon as possible. Pt would like a call back.

## 2022-12-11 IMAGING — MR MR LUMBAR SPINE WO/W CM
4 of 7 series · 18 of 48 positions shown · IV contrast (20    MULTI)
Comparison: Intraoperative fluoroscopic images of the lumbar spine
11/21/2019

CLINICAL DATA: Low back pain for 3 weeks.  Prior lumbar surgery.

EXAM:
MRI LUMBAR SPINE WITHOUT AND WITH CONTRAST
TECHNIQUE: Multiplanar and multiecho pulse sequences of the lumbar spine were
obtained without and with intravenous contrast.
CONTRAST:  10mL GADAVIST GADOBUTROL 1 MMOL/ML IV SOLN

[Series 4: T2 · sagittal · 4.0mm · 0.55mm/px · 3 of 12 slices shown (1 of 2)]
[im 1/12]
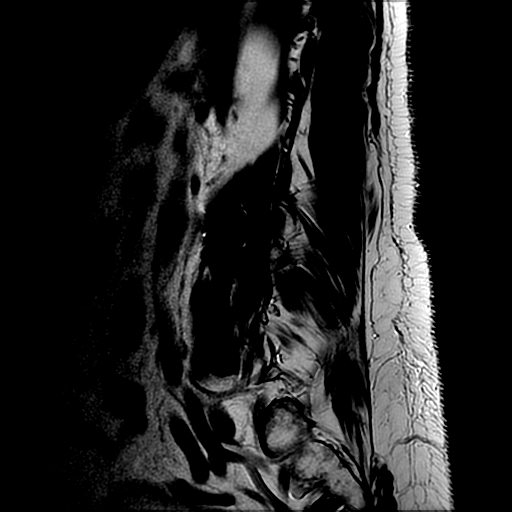
[im 6/12]
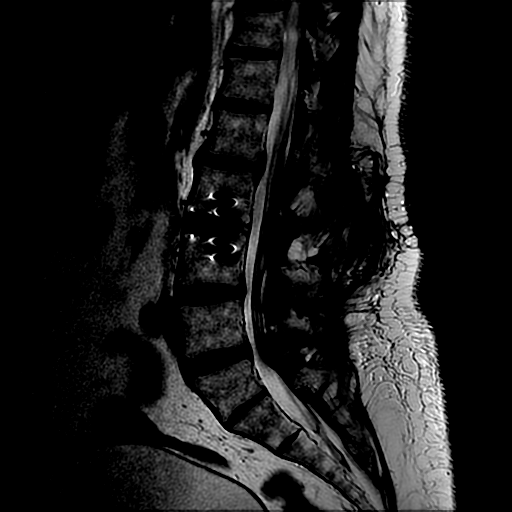
[im 12/12]
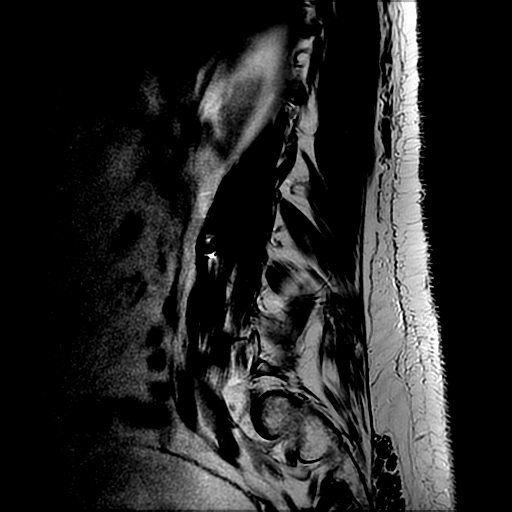

[Series 5: T1 · sagittal · 4.0mm · 0.55mm/px · 3 of 12 slices shown (1 of 2)]
[im 1/12]
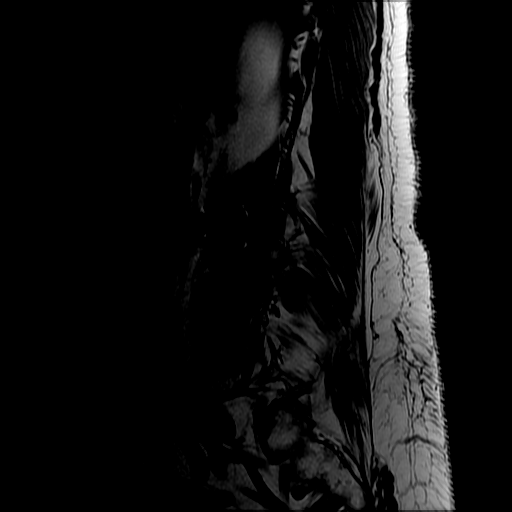
[im 8/12]
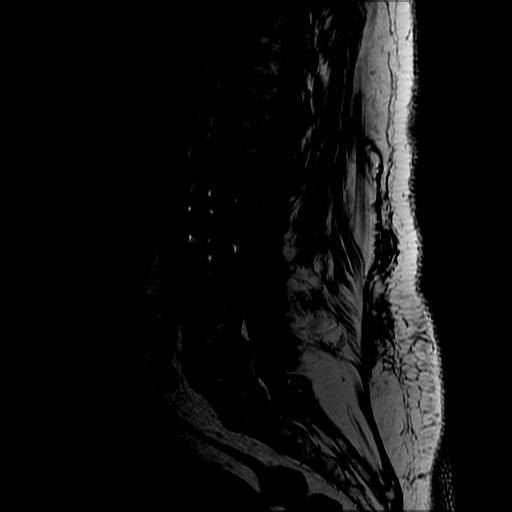
[im 12/12]
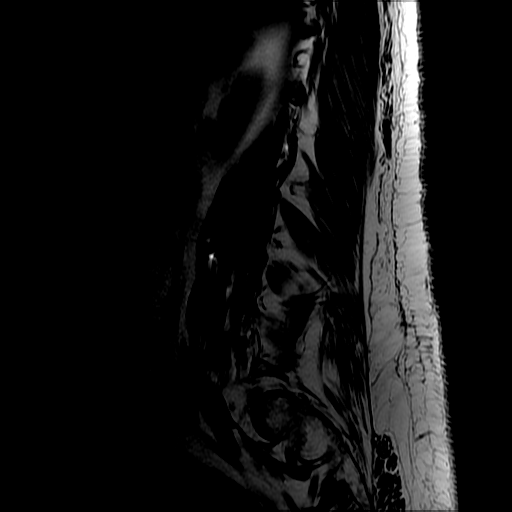

[Series 7: T2 · axial · 4.0mm · 0.39mm/px · z∈[+33,+180]mm · 9 of 32 slices shown (2 of 2)]
[im 1/32]
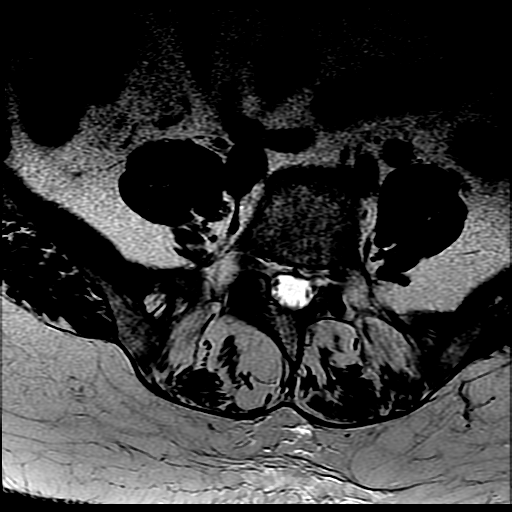
[im 4/32]
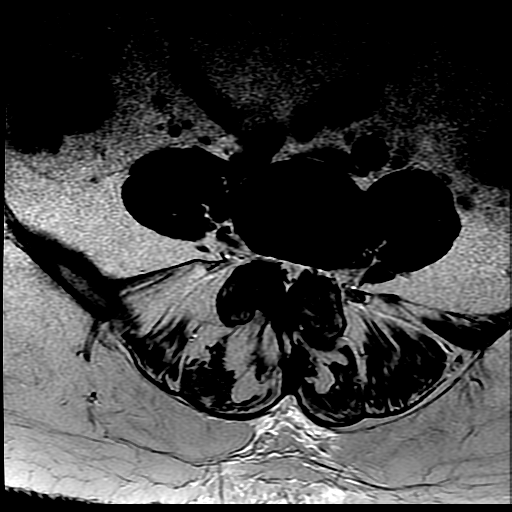
[im 7/32]
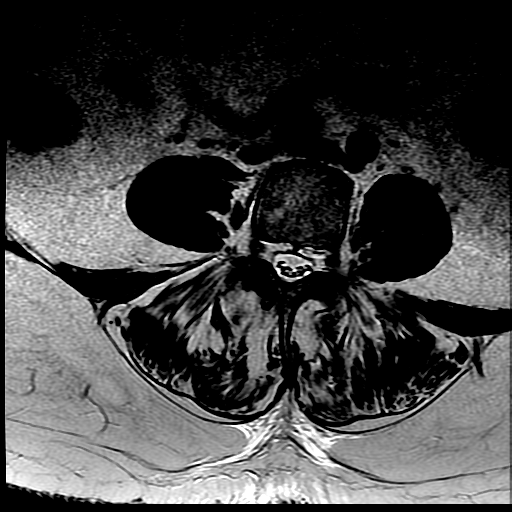
[im 10/32]
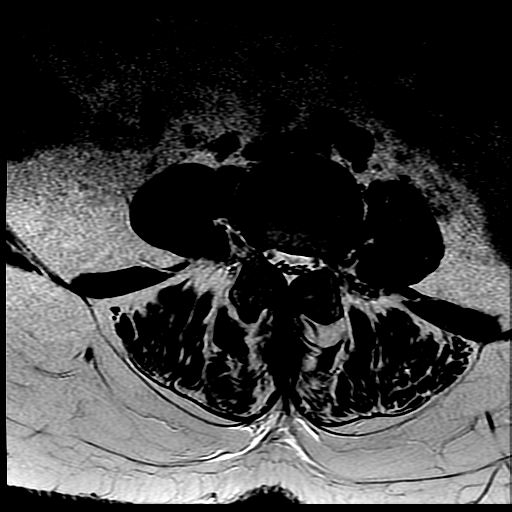
[im 13/32]
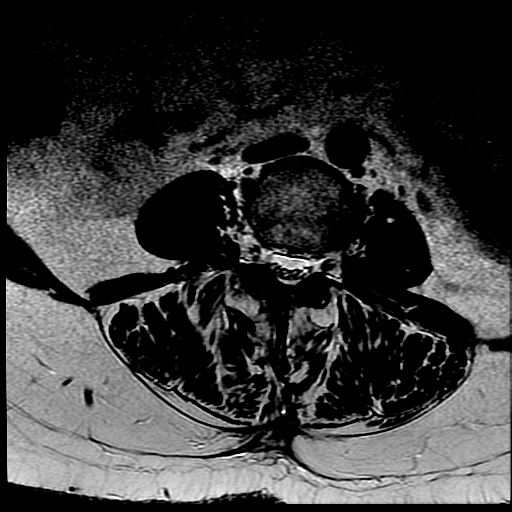
[im 16/32]
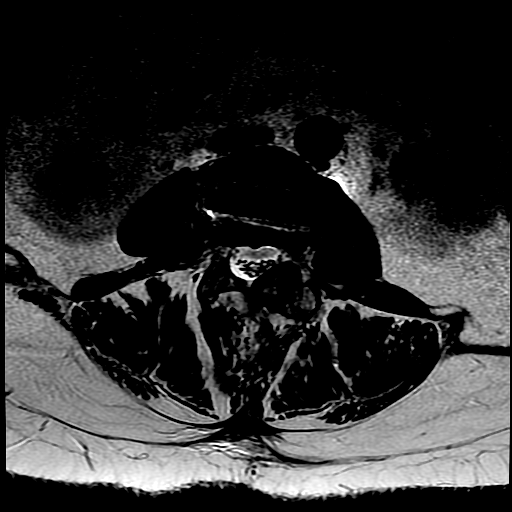
[im 19/32]
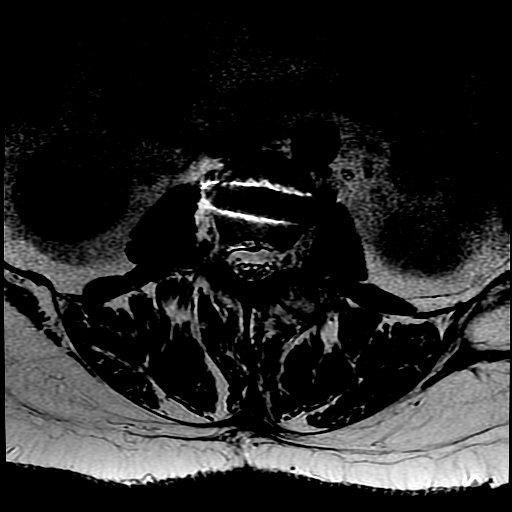
[im 22/32]
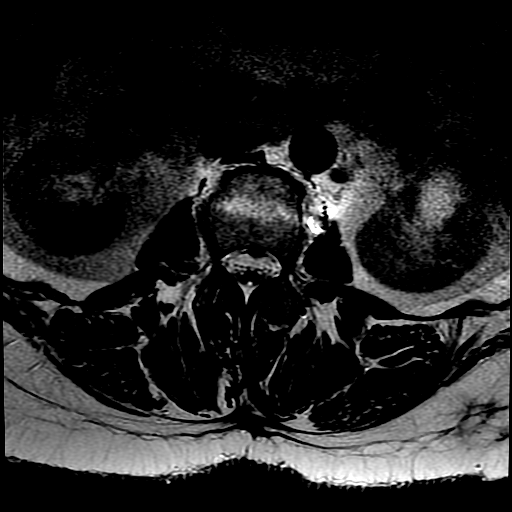
[im 28/32]
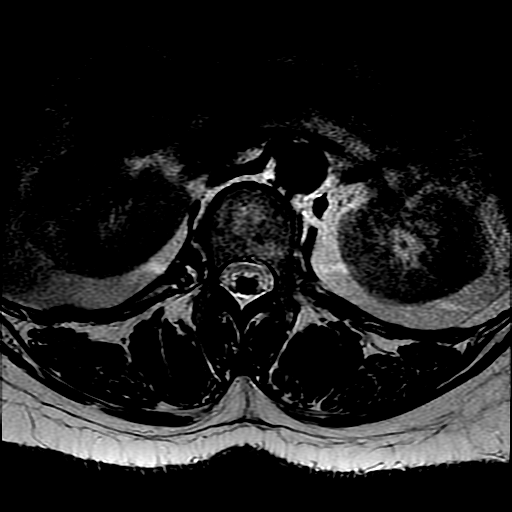

[Series 8: T1 · axial · 4.0mm · 0.39mm/px · z∈[+46,+174]mm · 3 of 32 slices shown (2 of 2)]
[im 4/32]
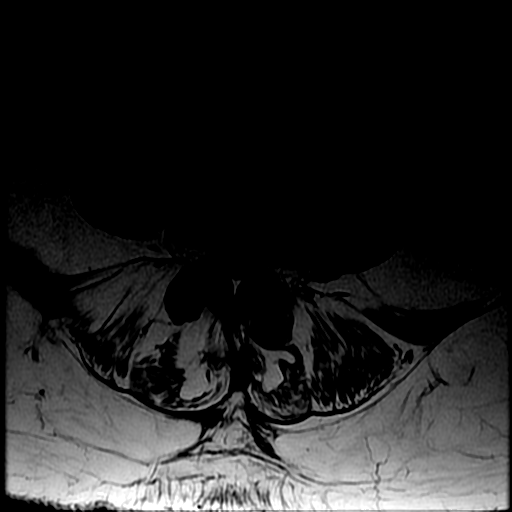
[im 16/32]
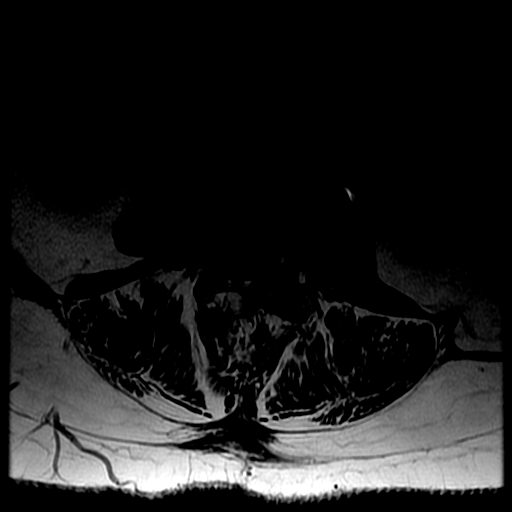
[im 28/32]
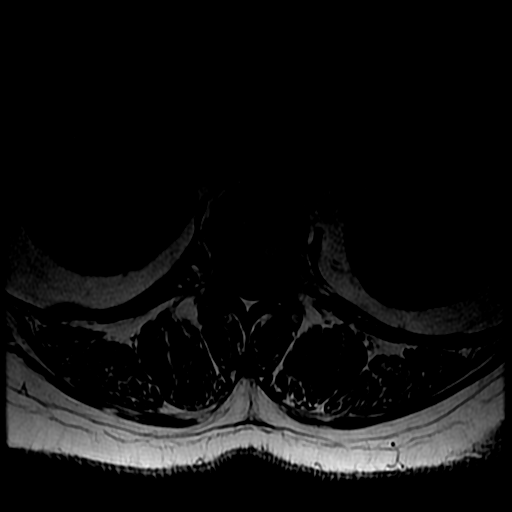

[18 of 48 positions shown; findings below may reference images not displayed]

FINDINGS: Segmentation: There is transitional lumbosacral anatomy. The
transitional segment is considered a partially sacralized L5 for
consistency with numbering used in the prior operative report.

Alignment:  Normal.

Vertebrae: No fracture or suspicious marrow lesion. Minimal
degenerative endplate edema at L1-2. Prior lateral lumbar interbody
fusion at L2-3.

Conus medullaris and cauda equina: Conus extends to the L2 level.
Conus and cauda equina appear normal.

Paraspinal and other soft tissues: Postoperative changes in the
posterior lumbar soft tissues. No fluid collection.

Disc levels:

T12-L1: Mild facet hypertrophy without disc herniation or stenosis.

L1-2: Small central disc extrusion with enhancing annular fissure,
mild disc bulging, and mild facet hypertrophy without stenosis.

L2-3: Prior posterior decompression and lateral interbody fusion.
Mild endplate spurring and facet hypertrophy without stenosis.

L3-4: Disc bulging and severe right and moderate left facet and
ligamentum flavum hypertrophy result in mild spinal stenosis without
neural foraminal stenosis.

L4-5: Disc bulging and severe facet hypertrophy result in mild
spinal stenosis, mild-to-moderate bilateral lateral recess stenosis,
and minimal left neural foraminal narrowing.

L5-S1: Only imaged sagittally. Transitional anatomy with hypoplastic
disc. Mild left greater than right neural foraminal narrowing due to
endplate spurring. No spinal stenosis.
IMPRESSION: 1. Transitional lumbosacral anatomy.
2. L2-3 fusion without stenosis.
3. Mild spinal stenosis and mild-to-moderate lateral recess stenosis
at L4-5.
4. Mild spinal stenosis at L3-4.

## 2022-12-14 ENCOUNTER — Ambulatory Visit: Payer: Medicare PPO | Admitting: Neurology

## 2022-12-15 DIAGNOSIS — M5451 Vertebrogenic low back pain: Secondary | ICD-10-CM | POA: Diagnosis not present

## 2022-12-20 DIAGNOSIS — G4733 Obstructive sleep apnea (adult) (pediatric): Secondary | ICD-10-CM | POA: Diagnosis not present

## 2022-12-22 DIAGNOSIS — M5451 Vertebrogenic low back pain: Secondary | ICD-10-CM | POA: Diagnosis not present

## 2022-12-31 DIAGNOSIS — F4311 Post-traumatic stress disorder, acute: Secondary | ICD-10-CM | POA: Diagnosis not present

## 2023-01-03 DIAGNOSIS — M5451 Vertebrogenic low back pain: Secondary | ICD-10-CM | POA: Diagnosis not present

## 2023-01-04 DIAGNOSIS — M5451 Vertebrogenic low back pain: Secondary | ICD-10-CM | POA: Diagnosis not present

## 2023-01-09 ENCOUNTER — Ambulatory Visit: Payer: Medicare PPO | Admitting: Neurology

## 2023-01-09 DIAGNOSIS — H02842 Edema of right lower eyelid: Secondary | ICD-10-CM | POA: Diagnosis not present

## 2023-01-09 DIAGNOSIS — L814 Other melanin hyperpigmentation: Secondary | ICD-10-CM | POA: Diagnosis not present

## 2023-01-09 DIAGNOSIS — L811 Chloasma: Secondary | ICD-10-CM | POA: Diagnosis not present

## 2023-01-09 DIAGNOSIS — L708 Other acne: Secondary | ICD-10-CM | POA: Diagnosis not present

## 2023-01-20 DIAGNOSIS — G4733 Obstructive sleep apnea (adult) (pediatric): Secondary | ICD-10-CM | POA: Diagnosis not present

## 2023-01-31 ENCOUNTER — Encounter: Payer: Self-pay | Admitting: Neurology

## 2023-01-31 ENCOUNTER — Ambulatory Visit: Payer: Medicare PPO | Admitting: Neurology

## 2023-01-31 VITALS — BP 148/74 | HR 74 | Ht 64.0 in | Wt 180.2 lb

## 2023-01-31 DIAGNOSIS — G43011 Migraine without aura, intractable, with status migrainosus: Secondary | ICD-10-CM | POA: Diagnosis not present

## 2023-01-31 DIAGNOSIS — G4733 Obstructive sleep apnea (adult) (pediatric): Secondary | ICD-10-CM

## 2023-01-31 DIAGNOSIS — M2619 Other specified anomalies of jaw-cranial base relationship: Secondary | ICD-10-CM

## 2023-01-31 DIAGNOSIS — J9601 Acute respiratory failure with hypoxia: Secondary | ICD-10-CM

## 2023-01-31 DIAGNOSIS — I2510 Atherosclerotic heart disease of native coronary artery without angina pectoris: Secondary | ICD-10-CM

## 2023-01-31 DIAGNOSIS — I421 Obstructive hypertrophic cardiomyopathy: Secondary | ICD-10-CM | POA: Diagnosis not present

## 2023-01-31 DIAGNOSIS — E782 Mixed hyperlipidemia: Secondary | ICD-10-CM

## 2023-01-31 MED ORDER — VITAMIN D 25 MCG (1000 UNIT) PO TABS: 1000.0000 [IU] | ORAL_TABLET | Freq: Every day | ORAL | 3 refills | Status: AC

## 2023-01-31 MED ORDER — LORAZEPAM 0.5 MG PO TABS: 0.2500 mg | ORAL_TABLET | Freq: Every day | ORAL | 0 refills | Status: AC | PRN

## 2023-01-31 MED ORDER — EMGALITY 120 MG/ML ~~LOC~~ SOAJ: 120.0000 mg | SUBCUTANEOUS | 5 refills | Status: DC
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
## 2023-02-08 ENCOUNTER — Telehealth: Payer: Self-pay | Admitting: Neurology

## 2023-02-08 NOTE — Telephone Encounter (Signed)
Returned pt's call regarding her message about vitamin d prescription. Advised pt that her vitamin d prescription was refilled by an error. Pt did state that she is taking vitamin D on a daily basis, so it was ok and no harm done. She stated that now she has refills for the next 3 months. Pt was appreciative for the call.

## 2023-02-08 NOTE — Telephone Encounter (Signed)
Pt states during her appointment there was no mention of her being put on cholecalciferol (VITAMIN D3) 25 MCG (1000 UNIT) tablet .  Pt is asking for a call to discuss the reason as to why she has been prescribed this supplement

## 2023-02-19 DIAGNOSIS — G4733 Obstructive sleep apnea (adult) (pediatric): Secondary | ICD-10-CM | POA: Diagnosis not present

## 2023-02-22 DIAGNOSIS — M5451 Vertebrogenic low back pain: Secondary | ICD-10-CM | POA: Diagnosis not present

## 2023-03-01 ENCOUNTER — Ambulatory Visit: Payer: Medicare PPO

## 2023-03-01 DIAGNOSIS — Z23 Encounter for immunization: Secondary | ICD-10-CM

## 2023-03-01 DIAGNOSIS — Z719 Counseling, unspecified: Secondary | ICD-10-CM

## 2023-03-01 NOTE — Progress Notes (Signed)
In nurse clinic requesting Moderna covid vaccine.  See immunization flowsheet.  Declined flu vaccine.  VIS given and after vaccine care reviewed.  Spikevax 12+ 2024-25 formula given and tolerated well.  [facility] updated and copy given.    Remained 15 minutes after vaccine given and no problems.  Cherlynn Polo, RN

## 2023-03-07 DIAGNOSIS — G4733 Obstructive sleep apnea (adult) (pediatric): Secondary | ICD-10-CM | POA: Diagnosis not present

## 2023-03-08 DIAGNOSIS — M5451 Vertebrogenic low back pain: Secondary | ICD-10-CM | POA: Diagnosis not present

## 2023-03-09 ENCOUNTER — Other Ambulatory Visit: Payer: Self-pay | Admitting: Orthopedic Surgery

## 2023-03-09 DIAGNOSIS — M5451 Vertebrogenic low back pain: Secondary | ICD-10-CM

## 2023-03-20 DIAGNOSIS — E041 Nontoxic single thyroid nodule: Secondary | ICD-10-CM | POA: Diagnosis not present

## 2023-03-20 DIAGNOSIS — E785 Hyperlipidemia, unspecified: Secondary | ICD-10-CM | POA: Diagnosis not present

## 2023-03-20 DIAGNOSIS — I10 Essential (primary) hypertension: Secondary | ICD-10-CM | POA: Diagnosis not present

## 2023-03-20 DIAGNOSIS — E1169 Type 2 diabetes mellitus with other specified complication: Secondary | ICD-10-CM | POA: Diagnosis not present

## 2023-03-20 DIAGNOSIS — I5032 Chronic diastolic (congestive) heart failure: Secondary | ICD-10-CM | POA: Diagnosis not present

## 2023-03-21 ENCOUNTER — Ambulatory Visit: Payer: Medicare PPO | Admitting: Cardiology

## 2023-03-22 ENCOUNTER — Ambulatory Visit: Payer: Medicare PPO | Attending: Cardiology | Admitting: Cardiology

## 2023-03-22 ENCOUNTER — Encounter: Payer: Self-pay | Admitting: Cardiology

## 2023-03-22 VITALS — BP 116/90 | HR 93 | Ht 62.0 in | Wt 181.6 lb

## 2023-03-22 DIAGNOSIS — G4733 Obstructive sleep apnea (adult) (pediatric): Secondary | ICD-10-CM

## 2023-03-22 DIAGNOSIS — E782 Mixed hyperlipidemia: Secondary | ICD-10-CM

## 2023-03-22 DIAGNOSIS — I10 Essential (primary) hypertension: Secondary | ICD-10-CM | POA: Diagnosis not present

## 2023-03-22 DIAGNOSIS — I5032 Chronic diastolic (congestive) heart failure: Secondary | ICD-10-CM

## 2023-03-22 DIAGNOSIS — I421 Obstructive hypertrophic cardiomyopathy: Secondary | ICD-10-CM | POA: Diagnosis not present

## 2023-03-22 NOTE — Progress Notes (Signed)
Cardiology Office Note:    Date:  03/24/2023   ID:  Tiondra Aguilar, DOB [birth year], MRN [id number]  PCP:  Laurann Montana, MD  Cardiologist:  Thomasene Ripple, DO  Electrophysiologist:  None   Referring MD: Laurann Montana, MD     History of Present Illness:    Robyn Aguilar is a 72 y.o. female with a hx of heart failure preserved ejection fraction, hyperlipidemia, OSA on CPAP, prior TIAs, diabetes mellitus here today for a follow-up visit.  This is my first encounter with the patient.  She previously followed with Dr. Katrinka Blazing after his retirement she transition to Dr. Shari Prows.  She was last seen by Dr. Shari Prows in May 2024.  During that visit it appears that she was doing well from a cardiovascular standpoint.  She reports feeling tired often, which she attributes to her sleep apnea. She also mentions feeling dizzy due to her blood sugar levels dropping, as she had not eaten before the appointment. The patient is currently managing her cholesterol with Repatha and Zetia, and her diabetes with Ozempic and Jardiance. The patient mentions that she does not exercise as much as she should due to her fatigue. She also reports that she has not experienced any chest pain or shortness of breath.  Past Medical History:  Diagnosis Date   Allergic rhinitis    Anxiety    Arthritis    knees and neck   Cataracts, bilateral    immature    CHF (congestive heart failure) (HCC) 2014   after surgery   Complication of anesthesia    had heart failure after knee surgery 02/04/13   DDD (degenerative disc disease), lumbar    Diabetes mellitus without complication (HCC)    Dizziness    takes Antivert prn   GERD (gastroesophageal reflux disease)    takes Aciphex prn   High cholesterol    but doesn't take any meds   History of colon polyps    History of migraine    last one 6-68months ago;takes Topamax nightly   Hypertension    takes Metoprolol and maxzide daily   Joint pain     Joint swelling    Migraine    Migraines    Pneumonia    at age 60   Seizures (HCC)    hx of;last one 30yrs ago;no meds required;states it was brought on by med;only one ever had   Shortness of breath 02/06/2013   Sleep apnea    sleep study in epic from 2014-uses CPAP   Stroke (HCC)    2 TIA   TIA (transient ischemic attack)     Past Surgical History:  Procedure Laterality Date   ABDOMINAL HYSTERECTOMY  1991   ANTERIOR LAT LUMBAR FUSION N/A 11/21/2019   Procedure: ANTERIOR LATERAL LUMBAR FUSION (XLIF) with lateral plate Lumbar two through lumbar three;  Surgeon: Venita Lick, MD;  Location: [facility] OR;  Service: Orthopedics;  Laterality: N/A;  3 hrs   carpel tunnel Right 1988   COLONOSCOPY     disc surgery  1996   ESOPHAGOGASTRODUODENOSCOPY     EYE SURGERY Bilateral    cataract surgery with lens implants   HERNIA REPAIR  1987   x 2   KNEE SURGERY Left 2011   replacement-partial   LEFT HEART CATH AND CORONARY ANGIOGRAPHY N/A 02/22/2021   Procedure: LEFT HEART CATH AND CORONARY ANGIOGRAPHY;  Surgeon: Yvonne Kendall, MD;  Location: [facility];  Service: Cardiovascular;  Laterality: N/A;   LUMBAR LAMINECTOMY/DECOMPRESSION MICRODISCECTOMY Right 07/13/2017  Procedure: Disectomy Right L2-3;  Surgeon: Venita Lick, MD;  Location: [facility] OR;  Service: Orthopedics;  Laterality: Right;  2.5 hrs   ROBOTIC ASSISTED BILATERAL SALPINGO OOPHERECTOMY Bilateral 10/22/2013   Procedure: ROBOTIC ASSISTED BILATERAL SALPINGO OOPHORECTOMY;  Surgeon: Rejeana Brock A. Duard Brady, MD;  Location: [facility] OR[facility];  Service: Gynecology;  Laterality: Bilateral;   ROTATOR CUFF REPAIR Right    TOTAL KNEE ARTHROPLASTY Right    TOTAL KNEE ARTHROPLASTY Right 02/04/2013   Procedure: RIGHT TOTAL KNEE ARTHROPLASTY;  Surgeon: Dannielle Huh, MD;  Location: [facility] OR;  Service: Orthopedics;  Laterality: Right;    Current Medications: Current Meds  Medication Sig   Accu-Chek Softclix Lancets lancets USE TO TEST EVERY DAY   acetaminophen  (TYLENOL) 650 MG CR tablet Take 1,300 mg by mouth every 8 (eight) hours as needed for pain.   AMBULATORY NON FORMULARY MEDICATION Medication Name: 90 ml viscous lidocaine:90 ml 10mg /49ml Dicyclomine:270 ml Maalox - 10 ml every 4-6 hours as needed.   amitriptyline (ELAVIL) 25 MG tablet Take 1 tablet (25 mg total) by mouth at bedtime.   aspirin EC 81 MG tablet Take 81 mg by mouth daily.   butalbital-acetaminophen-caffeine (FIORICET) 50-325-40 MG tablet Take 1 tablet by mouth every 4 (four) hours as needed for headache or migraine.   cetirizine (ZYRTEC) 10 MG tablet Take 10 mg by mouth daily.    cholecalciferol (VITAMIN D3) 25 MCG (1000 UNIT) tablet Take 1 tablet (1,000 Units total) by mouth daily.   empagliflozin (JARDIANCE) 10 MG TABS tablet Take 1 tablet (10 mg total) by mouth daily before breakfast.   Evolocumab (REPATHA SURECLICK) 140 MG/ML SOAJ INJECT 1 PEN INTO THE SKIN EVERY 14 DAYS   ezetimibe (ZETIA) 10 MG tablet TAKE 1 TABLET(10 MG) BY MOUTH DAILY   fluticasone (FLONASE) 50 MCG/ACT nasal spray Place 1-2 sprays into both nostrils at bedtime. As needed   Galcanezumab-gnlm (EMGALITY) 120 MG/ML SOAJ Inject 120 mg into the skin every 30 (thirty) days.   glucose blood (ACCU-CHEK AVIVA PLUS) test strip TEST EVERY DAY AS DIRECTED   lidocaine (LIDODERM) 5 % Place 1 patch onto the skin daily as needed (pain).   LORazepam (ATIVAN) 0.5 MG tablet Take 0.5-1 tablets (0.25-0.5 mg total) by mouth daily as needed for anxiety (for flight anxiety).   methylPREDNISolone (MEDROL DOSEPAK) 4 MG TBPK tablet Take 4 mg by mouth See admin instructions. follow package directions   metoprolol succinate (TOPROL-XL) 50 MG 24 hr tablet Take 1 tablet (50 mg total) by mouth daily. Take with or immediately following a meal.   nitroGLYCERIN (NITROSTAT) 0.4 MG SL tablet DISSOLVE 1 TABLET UNDER THE TONGUE EVERY 5 MINUTES AS NEEDED FOR CHEST PAIN. TAKE UP TO 3 TABLETS   ondansetron (ZOFRAN) 4 MG tablet Take 1 tablet (4 mg total)  by mouth every 8 (eight) hours as needed for nausea or vomiting.   RABEprazole (ACIPHEX) 20 MG tablet Take 20 mg by mouth every other day.   Semaglutide,0.25 or 0.5MG /DOS, (OZEMPIC, 0.25 OR 0.5 MG/DOSE,) 2 MG/1.5ML SOPN Inject 0.25 mg into the skin every Sunday.   Sodium Sulfate-Mag Sulfate-KCl (SUTAB) [phone] MG TABS Use as directed for colonoscopy. MANUFACTURER CODES!! BIN: [id number] PCN: CN GROUP: [id number] MEMBER ID: [id number];XLK AS SECONDARY INSURANCE ;NO PRIOR AUTHORIZATION   triamterene-hydrochlorothiazide (MAXZIDE) 75-50 MG tablet Take 0.5 tablets by mouth daily. Pt take one half tablet daily     Allergies:   Codeine, Statins, Ambien [zolpidem], Bempedoic acid, Budesonide, Crestor [rosuvastatin], Demerol [meperidine hcl], Edarbi [azilsartan], Fexofenadine, Olmesartan, Sulfamethoxazole-trimethoprim, Valsartan-hydrochlorothiazide, Welchol [  colesevelam], Adhesive [tape], Latex, and Tapentadol   Social History   Socioeconomic History   Marital status: Married    Spouse name: Not on file   Number of children: 2   Years of education: college   Highest education level: Not on file  Occupational History    Employer: [employer]  Tobacco Use   Smoking status: Never   Smokeless tobacco: Never  Vaping Use   Vaping status: Never Used  Substance and Sexual Activity   Alcohol use: No    Alcohol/week: 0.0 standard drinks of alcohol   Drug use: No   Sexual activity: Yes    Birth control/protection: Surgical  Other Topics Concern   Not on file  Social History Narrative   ** Merged History Encounter **       Lives with husband Caffeine use:   Social Determinants of Health   Financial Resource Strain: Not on file  Food Insecurity: Not on file  Transportation Needs: Not on file  Physical Activity: Unknown (03/01/2018)   Received from [facility]   Exercise Vital Sign    Days of Exercise per Week: Patient declined     Minutes of Exercise per Session: Patient declined  Stress: Not on file  Social Connections: Not on file     Family History: The patient's family history includes Bone cancer in her maternal grandfather; Colon cancer in her maternal grandfather; Hypertension in her mother; Pancreatic cancer in her mother; Prostate cancer in an other family member; Sleep apnea in her brother, maternal uncle, and mother. There is no history of Stomach cancer, Esophageal cancer, or Rectal cancer.  ROS:   Review of Systems  Constitution: Negative for decreased appetite, fever and weight gain.  HENT: Negative for congestion, ear discharge, hoarse voice and sore throat.   Eyes: Negative for discharge, redness, vision loss in right eye and visual halos.  Cardiovascular: Negative for chest pain, dyspnea on exertion, leg swelling, orthopnea and palpitations.  Respiratory: Negative for cough, hemoptysis, shortness of breath and snoring.   Endocrine: Negative for heat intolerance and polyphagia.  Hematologic/Lymphatic: Negative for bleeding problem. Does not bruise/bleed easily.  Skin: Negative for flushing, nail changes, rash and suspicious lesions.  Musculoskeletal: Negative for arthritis, joint pain, muscle cramps, myalgias, neck pain and stiffness.  Gastrointestinal: Negative for abdominal pain, bowel incontinence, diarrhea and excessive appetite.  Genitourinary: Negative for decreased libido, genital sores and incomplete emptying.  Neurological: Negative for brief paralysis, focal weakness, headaches and loss of balance.  Psychiatric/Behavioral: Negative for altered mental status, depression and suicidal ideas.  Allergic/Immunologic: Negative for HIV exposure and persistent infections.    EKGs/Labs/Other Studies Reviewed:    The following studies were reviewed today:   EKG:  The ekg ordered today demonstrates sinus rhythm with HR 92 bpm, RBBB  Recent Labs: 04/01/2022: ALT 36; BUN 20; Creatinine, Ser 0.90;  Hemoglobin 15.6; Platelets 247; Potassium 3.3; Sodium 140  Recent Lipid Panel    Component Value Date/Time   CHOL 146 08/24/2020 0919   TRIG 109 08/24/2020 0919   HDL 54 08/24/2020 0919   CHOLHDL 2.7 08/24/2020 0919   CHOLHDL 10.6 02/07/2013 0650   VLDL 24 02/07/2013 0650   LDLCALC 72 08/24/2020 0919    Physical Exam:    VS:  BP (!) 116/90 (BP Location: Left Arm, Patient Position: Sitting, Cuff Size: Normal)   Pulse 93   Ht 5\' 2"  (1.575 m)   Wt 181 lb 9.6 oz (82.4 kg)  SpO2 98%   BMI 33.22 kg/m     Wt Readings from Last 3 Encounters:  03/22/23 181 lb 9.6 oz (82.4 kg)  01/31/23 180 lb 3.2 oz (81.7 kg)  11/01/22 188 lb (85.3 kg)     GEN: Well nourished, well developed in no acute distress HEENT: Normal NECK: No JVD; No carotid bruits LYMPHATICS: No lymphadenopathy CARDIAC: S1S2 noted,RRR, no murmurs, rubs, gallops RESPIRATORY:  Clear to auscultation without rales, wheezing or rhonchi  ABDOMEN: Soft, non-tender, non-distended, +bowel sounds, no guarding. EXTREMITIES: No edema, No cyanosis, no clubbing MUSCULOSKELETAL:  No deformity  SKIN: Warm and dry NEUROLOGIC:  Alert and oriented x 3, non-focal PSYCHIATRIC:  Normal affect, good insight  ASSESSMENT:    1. Chronic diastolic heart failure (HCC)   2. Hypertrophic obstructive cardiomyopathy (HCC)   3. OSA on CPAP   4. Mixed hyperlipidemia   5. Morbid obesity (HCC)   6. Essential hypertension    PLAN:    Diabetes Mellitus Reports hypoglycemic symptoms this morning due to delayed breakfast. Currently on Ozempic and Jardiance. -Advised to never miss breakfast. -Continue current medications.  Hyperlipidemia Reports cholesterol is well-managed with Repatha and Isetama. -Continue current medications.  Heart Failure No current symptoms of chest pain or shortness of breath. -Continue current medications including Aspirin, Metoprolol, and Triamterene.  Sleep Apnea Reports using CPAP machine every night, but still  experiences fatigue. -Continue CPAP use nightly.  Follow-up in 4 months to reassess blood pressure and overall health status. If no acute issues arise, consider extending follow-up to 6 months.  The patient is in agreement with the above plan. The patient left the office in stable condition.  The patient will follow up in   Medication Adjustments/Labs and Tests Ordered: Current medicines are reviewed at length with the patient today.  Concerns regarding medicines are outlined above.  Orders Placed This Encounter  Procedures   EKG 12-Lead   No orders of the defined types were placed in this encounter.   Patient Instructions  Medication Instructions:  Your physician recommends that you continue on your current medications as directed. Please refer to the Current Medication list given to you today.  *If you need a refill on your cardiac medications before your next appointment, please call your pharmacy*   Follow-Up: At [facility], you and your health needs are our priority.  As part of our continuing mission to provide you with exceptional heart care, we have created designated Provider Care Teams.  These Care Teams include your primary Cardiologist (physician) and Advanced Practice Providers (APPs -  Physician Assistants and Nurse Practitioners) who all work together to provide you with the care you need, when you need it.   Your next appointment:   4 month(s)  Provider:   Thomasene Ripple, DO     Adopting a Healthy Lifestyle.  Know what a healthy weight is for you (roughly BMI <25) and aim to maintain this   Aim for 7+ servings of fruits and vegetables daily   65-80+ fluid ounces of water or unsweet tea for healthy kidneys   Limit to max 1 drink of alcohol per day; avoid smoking/tobacco   Limit animal fats in diet for cholesterol and heart health - choose grass fed whenever available   Avoid highly processed foods, and foods high in saturated/trans fats   Aim for  low stress - take time to unwind and care for your mental health   Aim for 150 min of moderate intensity exercise weekly for heart  health, and weights twice weekly for bone health   Aim for 7-9 hours of sleep daily   When it comes to diets, agreement about the perfect plan isnt easy to find, even among the experts. Experts at the [facility] [employer] developed an idea known as the Healthy Eating Plate. Just imagine a plate divided into logical, healthy portions.   The emphasis is on diet quality:   Load up on vegetables and fruits - one-half of your plate: Aim for color and variety, and remember that potatoes dont count.   Go for whole grains - one-quarter of your plate: Whole wheat, barley, wheat berries, quinoa, oats, brown rice, and foods made with them. If you want pasta, go with whole wheat pasta.   Protein power - one-quarter of your plate: Fish, chicken, beans, and nuts are all healthy, versatile protein sources. Limit red meat.   The diet, however, does go beyond the plate, offering a few other suggestions.   Use healthy plant oils, such as olive, canola, soy, corn, sunflower and peanut. Check the labels, and avoid partially hydrogenated oil, which have unhealthy trans fats.   If youre thirsty, drink water. Coffee and tea are good in moderation, but skip sugary drinks and limit milk and dairy products to one or two daily servings.   The type of carbohydrate in the diet is more important than the amount. Some sources of carbohydrates, such as vegetables, fruits, whole grains, and beans-are healthier than others.   Finally, stay active  Signed, Thomasene Ripple, DO  03/24/2023 8:58 PM    [facility]

## 2023-03-22 NOTE — Patient Instructions (Signed)
Medication Instructions:  Your physician recommends that you continue on your current medications as directed. Please refer to the Current Medication list given to you today.  *If you need a refill on your cardiac medications before your next appointment, please call your pharmacy*   Follow-Up: At [facility], you and your health needs are our priority.  As part of our continuing mission to provide you with exceptional heart care, we have created designated Provider Care Teams.  These Care Teams include your primary Cardiologist (physician) and Advanced Practice Providers (APPs -  Physician Assistants and Nurse Practitioners) who all work together to provide you with the care you need, when you need it.   Your next appointment:   4 month(s)  Provider:   Thomasene Ripple, DO

## 2023-03-27 ENCOUNTER — Ambulatory Visit
Admission: RE | Admit: 2023-03-27 | Discharge: 2023-03-27 | Disposition: A | Discharge status: Home / Self Care | Payer: Medicare PPO | Source: Ambulatory Visit | Attending: Orthopedic Surgery | Admitting: Orthopedic Surgery

## 2023-03-27 DIAGNOSIS — M5451 Vertebrogenic low back pain: Secondary | ICD-10-CM

## 2023-03-27 DIAGNOSIS — M48061 Spinal stenosis, lumbar region without neurogenic claudication: Secondary | ICD-10-CM | POA: Diagnosis not present

## 2023-04-11 ENCOUNTER — Other Ambulatory Visit: Payer: Self-pay | Admitting: Neurology

## 2023-04-12 DIAGNOSIS — M5451 Vertebrogenic low back pain: Secondary | ICD-10-CM | POA: Diagnosis not present

## 2023-04-21 DIAGNOSIS — G4733 Obstructive sleep apnea (adult) (pediatric): Secondary | ICD-10-CM | POA: Diagnosis not present

## 2023-05-04 DIAGNOSIS — R2689 Other abnormalities of gait and mobility: Secondary | ICD-10-CM | POA: Diagnosis not present

## 2023-05-04 DIAGNOSIS — M5459 Other low back pain: Secondary | ICD-10-CM | POA: Diagnosis not present

## 2023-05-11 DIAGNOSIS — M5459 Other low back pain: Secondary | ICD-10-CM | POA: Diagnosis not present

## 2023-05-11 DIAGNOSIS — R2689 Other abnormalities of gait and mobility: Secondary | ICD-10-CM | POA: Diagnosis not present

## 2023-05-18 DIAGNOSIS — R2689 Other abnormalities of gait and mobility: Secondary | ICD-10-CM | POA: Diagnosis not present

## 2023-05-18 DIAGNOSIS — M5459 Other low back pain: Secondary | ICD-10-CM | POA: Diagnosis not present

## 2023-05-22 DIAGNOSIS — G4733 Obstructive sleep apnea (adult) (pediatric): Secondary | ICD-10-CM | POA: Diagnosis not present

## 2023-06-01 DIAGNOSIS — R2689 Other abnormalities of gait and mobility: Secondary | ICD-10-CM | POA: Diagnosis not present

## 2023-06-01 DIAGNOSIS — M5459 Other low back pain: Secondary | ICD-10-CM | POA: Diagnosis not present

## 2023-06-07 DIAGNOSIS — Z1231 Encounter for screening mammogram for malignant neoplasm of breast: Secondary | ICD-10-CM | POA: Diagnosis not present

## 2023-06-07 DIAGNOSIS — Z01419 Encounter for gynecological examination (general) (routine) without abnormal findings: Secondary | ICD-10-CM | POA: Diagnosis not present

## 2023-06-08 ENCOUNTER — Telehealth: Payer: Self-pay | Admitting: Pharmacy Technician

## 2023-06-08 ENCOUNTER — Other Ambulatory Visit (HOSPITAL_COMMUNITY): Payer: Self-pay

## 2023-06-08 NOTE — Telephone Encounter (Signed)
 Pharmacy Patient Advocate Encounter   Received notification from CoverMyMeds that prior authorization for Emgality  120MG /ML auto-injectors (migraine) is required/requested.   Insurance verification completed.   The patient is insured through [employer] .   Per test claim: Refill too soon. PA is not needed at this time. Medication was filled 06/07/2023. Next eligible fill date is 06/28/2023.

## 2023-06-13 DIAGNOSIS — E559 Vitamin D deficiency, unspecified: Secondary | ICD-10-CM | POA: Diagnosis not present

## 2023-06-13 DIAGNOSIS — E785 Hyperlipidemia, unspecified: Secondary | ICD-10-CM | POA: Diagnosis not present

## 2023-06-13 DIAGNOSIS — I422 Other hypertrophic cardiomyopathy: Secondary | ICD-10-CM | POA: Diagnosis not present

## 2023-06-13 DIAGNOSIS — Z79899 Other long term (current) drug therapy: Secondary | ICD-10-CM | POA: Diagnosis not present

## 2023-06-13 DIAGNOSIS — I700 Atherosclerosis of aorta: Secondary | ICD-10-CM | POA: Diagnosis not present

## 2023-06-13 DIAGNOSIS — G720 Drug-induced myopathy: Secondary | ICD-10-CM | POA: Diagnosis not present

## 2023-06-13 DIAGNOSIS — E538 Deficiency of other specified B group vitamins: Secondary | ICD-10-CM | POA: Diagnosis not present

## 2023-06-13 DIAGNOSIS — Z0000 Encounter for general adult medical examination without abnormal findings: Secondary | ICD-10-CM | POA: Diagnosis not present

## 2023-06-13 DIAGNOSIS — I5032 Chronic diastolic (congestive) heart failure: Secondary | ICD-10-CM | POA: Diagnosis not present

## 2023-06-13 DIAGNOSIS — I10 Essential (primary) hypertension: Secondary | ICD-10-CM | POA: Diagnosis not present

## 2023-06-13 DIAGNOSIS — I2510 Atherosclerotic heart disease of native coronary artery without angina pectoris: Secondary | ICD-10-CM | POA: Diagnosis not present

## 2023-06-13 DIAGNOSIS — E1169 Type 2 diabetes mellitus with other specified complication: Secondary | ICD-10-CM | POA: Diagnosis not present

## 2023-06-22 DIAGNOSIS — G4733 Obstructive sleep apnea (adult) (pediatric): Secondary | ICD-10-CM | POA: Diagnosis not present

## 2023-06-27 DIAGNOSIS — M5451 Vertebrogenic low back pain: Secondary | ICD-10-CM | POA: Diagnosis not present

## 2023-07-06 ENCOUNTER — Ambulatory Visit: Payer: Medicare PPO | Admitting: Neurology

## 2023-07-06 ENCOUNTER — Encounter: Payer: Self-pay | Admitting: Neurology

## 2023-07-06 ENCOUNTER — Ambulatory Visit: Payer: Medicare PPO | Admitting: Adult Health

## 2023-07-06 VITALS — BP 141/86 | HR 77 | Ht 64.0 in | Wt 181.0 lb

## 2023-07-06 DIAGNOSIS — G4733 Obstructive sleep apnea (adult) (pediatric): Secondary | ICD-10-CM | POA: Diagnosis not present

## 2023-07-06 DIAGNOSIS — I5032 Chronic diastolic (congestive) heart failure: Secondary | ICD-10-CM | POA: Diagnosis not present

## 2023-07-06 DIAGNOSIS — F0781 Postconcussional syndrome: Secondary | ICD-10-CM | POA: Diagnosis not present

## 2023-07-06 DIAGNOSIS — G43011 Migraine without aura, intractable, with status migrainosus: Secondary | ICD-10-CM

## 2023-07-06 DIAGNOSIS — R200 Anesthesia of skin: Secondary | ICD-10-CM | POA: Diagnosis not present

## 2023-07-06 MED ORDER — AMITRIPTYLINE HCL 25 MG PO TABS: 25.0000 mg | ORAL_TABLET | Freq: Every evening | ORAL | 3 refills | Status: DC | PRN

## 2023-07-06 MED ORDER — EMGALITY 120 MG/ML ~~LOC~~ SOAJ: 120.0000 mg | SUBCUTANEOUS | 5 refills | Status: DC
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
## 2023-07-06 NOTE — Progress Notes (Signed)
 [name]

## 2023-07-06 NOTE — Patient Instructions (Signed)
Galcanezumab Injection What is this medication? GALCANEZUMAB (gal ka NEZ ue mab) prevents migraines. It works by blocking a substance in the body that causes migraines. It may also be used to treat cluster headaches. It is a monoclonal antibody. This medicine may be used for other purposes; ask your health care provider or pharmacist if you have questions. COMMON BRAND NAME(S): Emgality What should I tell my care team before I take this medication? They need to know if you have any of these conditions: An unusual or allergic reaction to galcanezumab, other medications, foods, dyes, or preservatives Pregnant or trying to get pregnant Breast-feeding How should I use this medication? This medication is injected under the skin. You will be taught how to prepare and give it. Take it as directed on the prescription label. Keep taking it unless your care team tells you to stop. It is important that you put your used needles and syringes in a special sharps container. Do not put them in a trash can. If you do not have a sharps container, call your pharmacist or care team to get one. Talk to your care team about the use of this medication in children. Special care may be needed. Overdosage: If you think you have taken too much of this medicine contact a poison control center or emergency room at once. NOTE: This medicine is only for you. Do not share this medicine with others. What if I miss a dose? If you miss a dose, take it as soon as you can. If it is almost time for your next dose, take only that dose. Do not take double or extra doses. What may interact with this medication? Interactions are not expected. This list may not describe all possible interactions. Give your health care provider a list of all the medicines, herbs, non-prescription drugs, or dietary supplements you use. Also tell them if you smoke, drink alcohol, or use illegal drugs. Some items may interact with your medicine. What should  I watch for while using this medication? Visit your care team for regular checks on your progress. Tell your care team if your symptoms do not start to get better or if they get worse. What side effects may I notice from receiving this medication? Side effects that you should report to your care team as soon as possible: Allergic reactions or angioedema--skin rash, itching or hives, swelling of the face, eyes, lips, tongue, arms, or legs, trouble swallowing or breathing Side effects that usually do not require medical attention (report to your care team if they continue or are bothersome): Pain, redness, or irritation at injection site This list may not describe all possible side effects. Call your doctor for medical advice about side effects. You may report side effects to FDA at 1-800-FDA-1088. Where should I keep my medication? Keep out of the reach of children and pets. Store in a refrigerator or at room temperature between 20 and 25 degrees C (68 and 77 degrees F). Refrigeration (preferred): Store in the refrigerator. Do not freeze. Keep in the original container until you are ready to take it. Remove the dose from the carton about 30 minutes before it is time for you to use it. If the dose is not used, it may be stored in original container at room temperature for 7 days. Get rid of any unused medication after the expiration date. Room Temperature: This medication may be stored at room temperature for up to 7 days. Keep it in the original container. Protect from  light until time of use. If it is stored at room temperature, get rid of any unused medication after 7 days or after it expires, whichever is first. To get rid of medications that are no longer needed or have expired: Take the medication to a medication take-back program. Check with your pharmacy or law enforcement to find a location. If you cannot return the medication, ask your pharmacist or care team how to get rid of this medication  safely. NOTE: This sheet is a summary. It may not cover all possible information. If you have questions about this medicine, talk to your doctor, pharmacist, or health care provider.  2024 Elsevier/Gold Standard (2021-06-14 00:00:00)

## 2023-07-14 DIAGNOSIS — G4733 Obstructive sleep apnea (adult) (pediatric): Secondary | ICD-10-CM | POA: Diagnosis not present

## 2023-07-20 ENCOUNTER — Telehealth: Payer: Self-pay | Admitting: Neurology

## 2023-07-20 DIAGNOSIS — S065XAA Traumatic subdural hemorrhage with loss of consciousness status unknown, initial encounter: Secondary | ICD-10-CM

## 2023-07-20 DIAGNOSIS — G43011 Migraine without aura, intractable, with status migrainosus: Secondary | ICD-10-CM

## 2023-07-20 DIAGNOSIS — F0781 Postconcussional syndrome: Secondary | ICD-10-CM

## 2023-07-20 DIAGNOSIS — G43109 Migraine with aura, not intractable, without status migrainosus: Secondary | ICD-10-CM

## 2023-07-20 DIAGNOSIS — G4733 Obstructive sleep apnea (adult) (pediatric): Secondary | ICD-10-CM | POA: Diagnosis not present

## 2023-07-20 NOTE — Telephone Encounter (Signed)
 Pt called and LVM stating that she is having dizzy spells and she has just been seen so she would like to discuss.
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
## 2023-07-21 ENCOUNTER — Ambulatory Visit: Payer: Medicare PPO | Attending: Cardiology | Admitting: Cardiology

## 2023-07-21 ENCOUNTER — Ambulatory Visit (INDEPENDENT_AMBULATORY_CARE_PROVIDER_SITE_OTHER)

## 2023-07-21 ENCOUNTER — Encounter: Payer: Self-pay | Admitting: Cardiology

## 2023-07-21 VITALS — BP 128/74 | HR 79 | Ht 64.0 in | Wt 181.2 lb

## 2023-07-21 DIAGNOSIS — I5032 Chronic diastolic (congestive) heart failure: Secondary | ICD-10-CM

## 2023-07-21 DIAGNOSIS — I10 Essential (primary) hypertension: Secondary | ICD-10-CM

## 2023-07-21 DIAGNOSIS — R42 Dizziness and giddiness: Secondary | ICD-10-CM

## 2023-07-21 DIAGNOSIS — I421 Obstructive hypertrophic cardiomyopathy: Secondary | ICD-10-CM | POA: Diagnosis not present

## 2023-07-21 NOTE — Progress Notes (Unsigned)
 Enrolled patient for a 14 day Zio XT  monitor to be mailed to patients home

## 2023-07-21 NOTE — Patient Instructions (Addendum)
 Medication Instructions:  Your physician recommends that you continue on your current medications as directed. Please refer to the Current Medication list given to you today.  *If you need a refill on your cardiac medications before your next appointment, please call your pharmacy*  Testing/Procedures: ZIO XT- Long Term Monitor Instructions  Your physician has requested you wear a ZIO patch monitor for 14 days.  This is a single patch monitor. [employer] supplies one patch monitor per enrollment. Additional stickers are not available. Please do not apply patch if you will be having a Nuclear Stress Test,  Echocardiogram, Cardiac CT, MRI, or Chest Xray during the period you would be wearing the  monitor. The patch cannot be worn during these tests. You cannot remove and re-apply the  ZIO XT patch monitor.  Your ZIO patch monitor will be mailed 3 day USPS to your address on file. It may take 3-5 days  to receive your monitor after you have been enrolled.  Once you have received your monitor, please review the enclosed instructions. Your monitor  has already been registered assigning a specific monitor serial # to you.  Billing and Patient Assistance Program Information  We have supplied [employer] with any of your insurance information on file for billing purposes. [employer] offers a sliding scale Patient Assistance Program for patients that do not have  insurance, or whose insurance does not completely cover the cost of the ZIO monitor.  You must apply for the Patient Assistance Program to qualify for this discounted rate.  To apply, please call [employer] at [phone], select option 4, select option 2, ask to apply for  Patient Assistance Program. Meredeth Ide will ask your household income, and how many people  are in your household. They will quote your out-of-pocket cost based on that information.  [employer] will also be able to set up a 5-month, interest-free payment plan if needed.  Applying the  monitor   Shave hair from upper left chest.  Hold abrader disc by orange tab. Rub abrader in 40 strokes over the upper left chest as  indicated in your monitor instructions.  Clean area with 4 enclosed alcohol pads. Let dry.  Apply patch as indicated in monitor instructions. Patch will be placed under collarbone on left  side of chest with arrow pointing upward.  Rub patch adhesive wings for 2 minutes. Remove white label marked "1". Remove the white  label marked "2". Rub patch adhesive wings for 2 additional minutes.  While looking in a mirror, press and release button in center of patch. A small green light will  flash 3-4 times. This will be your only indicator that the monitor has been turned on.  Do not shower for the first 24 hours. You may shower after the first 24 hours.  Press the button if you feel a symptom. You will hear a small click. Record Date, Time and  Symptom in the Patient Logbook.  When you are ready to remove the patch, follow instructions on the last 2 pages of Patient  Logbook. Stick patch monitor onto the last page of Patient Logbook.  Place Patient Logbook in the blue and white box. Use locking tab on box and tape box closed  securely. The blue and white box has prepaid postage on it. Please place it in the mailbox as  soon as possible. Your physician should have your test results approximately 7 days after the  monitor has been mailed back to [facility].  Call [facility] Customer Care at  [phone] if you have questions regarding  your ZIO XT patch monitor. Call them immediately if you see an orange light blinking on your  monitor.  If your monitor falls off in less than 4 days, contact our Monitor department at [phone].  If your monitor becomes loose or falls off after 4 days call [employer] at [phone] for  suggestions on securing your monitor   Follow-Up: At [facility], you and your health needs are our priority.  As part of  our continuing mission to provide you with exceptional heart care, we have created designated Provider Care Teams.  These Care Teams include your primary Cardiologist (physician) and Advanced Practice Providers (APPs -  Physician Assistants and Nurse Practitioners) who all work together to provide you with the care you need, when you need it.  Your next appointment:   1 year  Provider:   Thomasene Ripple, DO

## 2023-07-21 NOTE — Progress Notes (Signed)
 Cardiology Office Note:    Date:  07/21/2023   ID:  Robyn Aguilar, DOB 07-05-1950, MRN [id number]  PCP:  Laurann Montana, MD  Cardiologist:  Thomasene Ripple, DO  Electrophysiologist:  None   Referring MD: Laurann Montana, MD   " I have been dizzy recently"   History of Present Illness:    Robyn Aguilar is a 73 y.o. female with a hx of chronic diastolic heart failure, hyperlipidemia, OSA on CPAP, prior TIA, diabetes mellitus. HCM  here today for follow-up visit.  I saw the patient in November 2024 at that time she appeared to be stable from a cardiovascular standpoint.  She presents with episodes of dizziness. The dizziness is described as a feeling of fullness in the head and occurs while the patient is sitting. The episodes are severe enough to prevent the patient from standing and walking. The patient denies any correlation with head movements. The patient has been monitoring her blood sugar levels, which were 100 mg/dL on the morning of the most recent episode. The patient did not check her blood sugar level after the episode.    Past Medical History:  Diagnosis Date   Allergic rhinitis    Anxiety    Arthritis    knees and neck   Cataracts, bilateral    immature    CHF (congestive heart failure) (HCC) 2014   after surgery   Complication of anesthesia    had heart failure after knee surgery 02/04/13   DDD (degenerative disc disease), lumbar    Diabetes mellitus without complication (HCC)    Dizziness    takes Antivert prn   GERD (gastroesophageal reflux disease)    takes Aciphex prn   High cholesterol    but doesn't take any meds   History of colon polyps    History of migraine    last one 6-75months ago;takes Topamax nightly   Hypertension    takes Metoprolol and maxzide daily   Joint pain    Joint swelling    Migraine    Migraines    Pneumonia    at age 45   Seizures (HCC)    hx of;last one 3yrs ago;no meds required;states it was brought on by  med;only one ever had   Shortness of breath 02/06/2013   Sleep apnea    sleep study in epic from 2014-uses CPAP   Stroke (HCC)    2 TIA   TIA (transient ischemic attack)     Past Surgical History:  Procedure Laterality Date   ABDOMINAL HYSTERECTOMY  1991   ANTERIOR LAT LUMBAR FUSION N/A 11/21/2019   Procedure: ANTERIOR LATERAL LUMBAR FUSION (XLIF) with lateral plate Lumbar two through lumbar three;  Surgeon: Venita Lick, MD;  Location: [facility] OR;  Service: Orthopedics;  Laterality: N/A;  3 hrs   carpel tunnel Right 1988   COLONOSCOPY     disc surgery  1996   ESOPHAGOGASTRODUODENOSCOPY     EYE SURGERY Bilateral    cataract surgery with lens implants   HERNIA REPAIR  1987   x 2   KNEE SURGERY Left 2011   replacement-partial   LEFT HEART CATH AND CORONARY ANGIOGRAPHY N/A 02/22/2021   Procedure: LEFT HEART CATH AND CORONARY ANGIOGRAPHY;  Surgeon: Yvonne Kendall, MD;  Location: [facility] INVASIVE CV LAB;  Service: Cardiovascular;  Laterality: N/A;   LUMBAR LAMINECTOMY/DECOMPRESSION MICRODISCECTOMY Right 07/13/2017   Procedure: Disectomy Right L2-3;  Surgeon: Venita Lick, MD;  Location: [facility] OR;  Service: Orthopedics;  Laterality: Right;  2.5 hrs  ROBOTIC ASSISTED BILATERAL SALPINGO OOPHERECTOMY Bilateral 10/22/2013   Procedure: ROBOTIC ASSISTED BILATERAL SALPINGO OOPHORECTOMY;  Surgeon: Rejeana Brock A. Duard Brady, MD;  Location: [facility] ORS;  Service: Gynecology;  Laterality: Bilateral;   ROTATOR CUFF REPAIR Right    TOTAL KNEE ARTHROPLASTY Right    TOTAL KNEE ARTHROPLASTY Right 02/04/2013   Procedure: RIGHT TOTAL KNEE ARTHROPLASTY;  Surgeon: Dannielle Huh, MD;  Location: [facility] OR;  Service: Orthopedics;  Laterality: Right;    Current Medications: Current Meds  Medication Sig   Accu-Chek Softclix Lancets lancets USE TO TEST EVERY DAY   ACETAMINOPHEN ER PO Take 500 mg by mouth every 8 (eight) hours as needed for pain.   AMBULATORY NON FORMULARY MEDICATION Medication Name: 90 ml viscous lidocaine:90 ml 10mg /86ml  Dicyclomine:270 ml Maalox - 10 ml every 4-6 hours as needed.   amitriptyline (ELAVIL) 25 MG tablet Take 1 tablet (25 mg total) by mouth at bedtime as needed for sleep.   aspirin EC 81 MG tablet Take 81 mg by mouth daily.   cetirizine (ZYRTEC) 10 MG tablet Take 10 mg by mouth daily.    cholecalciferol (VITAMIN D3) 25 MCG (1000 UNIT) tablet Take 1 tablet (1,000 Units total) by mouth daily.   empagliflozin (JARDIANCE) 10 MG TABS tablet Take 1 tablet (10 mg total) by mouth daily before breakfast.   Evolocumab (REPATHA SURECLICK) 140 MG/ML SOAJ INJECT 1 PEN INTO THE SKIN EVERY 14 DAYS   ezetimibe (ZETIA) 10 MG tablet TAKE 1 TABLET(10 MG) BY MOUTH DAILY   fluticasone (FLONASE) 50 MCG/ACT nasal spray Place 1-2 sprays into both nostrils at bedtime. As needed   Galcanezumab-gnlm (EMGALITY) 120 MG/ML SOAJ Inject 120 mg into the skin every 30 (thirty) days.   glucose blood (ACCU-CHEK AVIVA PLUS) test strip TEST EVERY DAY AS DIRECTED   lidocaine (LIDODERM) 5 % Place 1 patch onto the skin daily as needed (pain).   LORazepam (ATIVAN) 0.5 MG tablet Take 0.5-1 tablets (0.25-0.5 mg total) by mouth daily as needed for anxiety (for flight anxiety).   metoprolol succinate (TOPROL-XL) 50 MG 24 hr tablet Take 1 tablet (50 mg total) by mouth daily. Take with or immediately following a meal.   nitroGLYCERIN (NITROSTAT) 0.4 MG SL tablet DISSOLVE 1 TABLET UNDER THE TONGUE EVERY 5 MINUTES AS NEEDED FOR CHEST PAIN. TAKE UP TO 3 TABLETS   ondansetron (ZOFRAN) 4 MG tablet Take 1 tablet (4 mg total) by mouth every 8 (eight) hours as needed for nausea or vomiting.   RABEprazole (ACIPHEX) 20 MG tablet Take 20 mg by mouth every other day.   Semaglutide,0.25 or 0.5MG /DOS, (OZEMPIC, 0.25 OR 0.5 MG/DOSE,) 2 MG/1.5ML SOPN Inject 0.25 mg into the skin every Sunday.   Sodium Sulfate-Mag Sulfate-KCl (SUTAB) 640 625 9334 MG TABS Use as directed for colonoscopy. MANUFACTURER CODES!! BIN: [id number] PCN: CN GROUP: [id number] MEMBER ID:  [id number];NUU AS SECONDARY INSURANCE ;NO PRIOR AUTHORIZATION   triamterene-hydrochlorothiazide (MAXZIDE) 75-50 MG tablet Take 0.5 tablets by mouth daily. Pt take one half tablet daily     Allergies:   Codeine, Statins, Ambien [zolpidem], Bempedoic acid, Budesonide, Crestor [rosuvastatin], Demerol [meperidine hcl], Edarbi [azilsartan], Fexofenadine, Olmesartan, Sulfamethoxazole-trimethoprim, Valsartan-hydrochlorothiazide, Welchol [colesevelam], Adhesive [tape], Latex, and Tapentadol   Social History   Socioeconomic History   Marital status: Married    Spouse name: Not on file   Number of children: 2   Years of education: college   Highest education level: Not on file  Occupational History    Employer: [employer]  Tobacco Use   Smoking status: Never  Smokeless tobacco: Never  Vaping Use   Vaping status: Never Used  Substance and Sexual Activity   Alcohol use: No    Alcohol/week: 0.0 standard drinks of alcohol   Drug use: No   Sexual activity: Yes    Birth control/protection: Surgical  Other Topics Concern   Not on file  Social History Narrative   ** Merged History Encounter **       Lives with husband Caffeine use:   Social Drivers of Corporate investment banker Strain: Not on file  Food Insecurity: Not on file  Transportation Needs: Not on file  Physical Activity: Unknown (03/01/2018)   [facility]   Exercise Vital Sign    Days of Exercise per Week: Patient declined    Minutes of Exercise per Session: Patient declined  Stress: Not on file  Social Connections: Not on file     Family History: The patient's family history includes Bone cancer in her maternal grandfather; Colon cancer in her maternal grandfather; Hypertension in her mother; Pancreatic cancer in her mother; Prostate cancer in an other family member; Sleep apnea in her brother, maternal uncle, and mother. There is no history of  Stomach cancer, Esophageal cancer, or Rectal cancer.  ROS:   Review of Systems  Constitution: Negative for decreased appetite, fever and weight gain.  HENT: Negative for congestion, ear discharge, hoarse voice and sore throat.   Eyes: Negative for discharge, redness, vision loss in right eye and visual halos.  Cardiovascular: Negative for chest pain, dyspnea on exertion, leg swelling, orthopnea and palpitations.  Respiratory: Negative for cough, hemoptysis, shortness of breath and snoring.   Endocrine: Negative for heat intolerance and polyphagia.  Hematologic/Lymphatic: Negative for bleeding problem. Does not bruise/bleed easily.  Skin: Negative for flushing, nail changes, rash and suspicious lesions.  Musculoskeletal: Negative for arthritis, joint pain, muscle cramps, myalgias, neck pain and stiffness.  Gastrointestinal: Negative for abdominal pain, bowel incontinence, diarrhea and excessive appetite.  Genitourinary: Negative for decreased libido, genital sores and incomplete emptying.  Neurological: Negative for brief paralysis, focal weakness, headaches and loss of balance.  Psychiatric/Behavioral: Negative for altered mental status, depression and suicidal ideas.  Allergic/Immunologic: Negative for HIV exposure and persistent infections.    EKGs/Labs/Other Studies Reviewed:    The following studies were reviewed today:   EKG:  The ekg ordered today demonstrates   Recent Labs: No results found for requested labs within last 365 days.  Recent Lipid Panel    Component Value Date/Time   CHOL 146 08/24/2020 0919   TRIG 109 08/24/2020 0919   HDL 54 08/24/2020 0919   CHOLHDL 2.7 08/24/2020 0919   CHOLHDL 10.6 02/07/2013 0650   VLDL 24 02/07/2013 0650   LDLCALC 72 08/24/2020 0919    Physical Exam:    VS:  BP 128/74 (BP Location: Right Arm, Patient Position: Sitting, Cuff Size: Normal)   Pulse 79   Ht 5\' 4"  (1.626 m)   Wt 181 lb 3.2 oz (82.2 kg)   SpO2 93%   BMI 31.10  kg/m     Wt Readings from Last 3 Encounters:  07/21/23 181 lb 3.2 oz (82.2 kg)  07/06/23 181 lb (82.1 kg)  03/22/23 181 lb 9.6 oz (82.4 kg)     GEN: Well nourished, well developed in no acute distress HEENT: Normal NECK: No JVD; No carotid bruits LYMPHATICS: No lymphadenopathy CARDIAC: S1S2 noted,RRR, no murmurs, rubs, gallops RESPIRATORY:  Clear to auscultation without rales, wheezing  or rhonchi  ABDOMEN: Soft, non-tender, non-distended, +bowel sounds, no guarding. EXTREMITIES: No edema, No cyanosis, no clubbing MUSCULOSKELETAL:  No deformity  SKIN: Warm and dry NEUROLOGIC:  Alert and oriented x 3, non-focal PSYCHIATRIC:  Normal affect, good insight  ASSESSMENT:    No diagnosis found. PLAN:     Dizziness Sudden severe dizziness with head fullness, unrelated to head movements, no palpitations. Differential includes cardiac arrhythmia and vestibular issues. Cardiac monitoring planned. - Send cardiac monitor for 14-day heart rhythm assessment. - Instruct to press monitor button during symptoms to correlate with heart rhythm. - Refer to ENT if cardiac results are normal.  Diabetes Mellitus Reports hypoglycemic symptoms this morning due to delayed breakfast. Currently on Ozempic and Jardiance. -Advised to never miss breakfast. -Continue current medications.   Hyperlipidemia Reports cholesterol is well-managed with Repatha and Isetama. -Continue current medications.   Heart Failure No current symptoms of chest pain or shortness of breath. -Continue current medications including Aspirin, Metoprolol, and Triamterene.  HCM-will need to get a repeat echocardiogram at her follow-up visit or a cardiac MRI.   Sleep Apnea Reports using CPAP machine every night, but still experiences fatigue. -Continue CPAP use nightly.  The patient is in agreement with the above plan. The patient left the office in stable condition.  The patient will follow up in   Medication  Adjustments/Labs and Tests Ordered: Current medicines are reviewed at length with the patient today.  Concerns regarding medicines are outlined above.  No orders of the defined types were placed in this encounter.  No orders of the defined types were placed in this encounter.   There are no Patient Instructions on file for this visit.   Adopting a Healthy Lifestyle.  Know what a healthy weight is for you (roughly BMI <25) and aim to maintain this   Aim for 7+ servings of fruits and vegetables daily   65-80+ fluid ounces of water or unsweet tea for healthy kidneys   Limit to max 1 drink of alcohol per day; avoid smoking/tobacco   Limit animal fats in diet for cholesterol and heart health - choose grass fed whenever available   Avoid highly processed foods, and foods high in saturated/trans fats   Aim for low stress - take time to unwind and care for your mental health   Aim for 150 min of moderate intensity exercise weekly for heart health, and weights twice weekly for bone health   Aim for 7-9 hours of sleep daily   When it comes to diets, agreement about the perfect plan isnt easy to find, even among the experts. Experts at the [facility] [employer] developed an idea known as the Healthy Eating Plate. Just imagine a plate divided into logical, healthy portions.   The emphasis is on diet quality:   Load up on vegetables and fruits - one-half of your plate: Aim for color and variety, and remember that potatoes dont count.   Go for whole grains - one-quarter of your plate: Whole wheat, barley, wheat berries, quinoa, oats, brown rice, and foods made with them. If you want pasta, go with whole wheat pasta.   Protein power - one-quarter of your plate: Fish, chicken, beans, and nuts are all healthy, versatile protein sources. Limit red meat.   The diet, however, does go beyond the plate, offering a few other suggestions.   Use healthy plant oils, such as olive, canola,  soy, corn, sunflower and peanut. Check the labels, and avoid partially hydrogenated oil,  which have unhealthy trans fats.   If youre thirsty, drink water. Coffee and tea are good in moderation, but skip sugary drinks and limit milk and dairy products to one or two daily servings.   The type of carbohydrate in the diet is more important than the amount. Some sources of carbohydrates, such as vegetables, fruits, whole grains, and beans-are healthier than others.   Finally, stay active  Osvaldo Shipper, DO  07/21/2023 8:42 AM     [facility]

## 2023-07-25 DIAGNOSIS — E119 Type 2 diabetes mellitus without complications: Secondary | ICD-10-CM | POA: Diagnosis not present

## 2023-07-25 DIAGNOSIS — Z961 Presence of intraocular lens: Secondary | ICD-10-CM | POA: Diagnosis not present

## 2023-07-25 DIAGNOSIS — H526 Other disorders of refraction: Secondary | ICD-10-CM | POA: Diagnosis not present

## 2023-07-31 ENCOUNTER — Telehealth: Payer: Self-pay | Admitting: Cardiology

## 2023-07-31 NOTE — Telephone Encounter (Signed)
 Spoke to patient she stated she just started wearing heart monitor.Stated patch is irritating her skin causing redness and itching.She is taking Zyrtec which is helping.Stated she has to wear 10 more days.Advised to try and wear.Ok to take Zyrtec as needed.Advised to call back if unable to wear.I will make Dr.Tobb aware.

## 2023-07-31 NOTE — Telephone Encounter (Signed)
  1. Is this related to a heart monitor you are wearing?  (If the patient says no, please ask     if they are caling about ICD/pacemaker.) Yes  2. What is your issue?? Pt is breaking out wear the monitor is, red and itchy, take zyrtec daily-minimal relief,  but has not tried anything else   Please route to covering RN/CMA/RMA for results. Route to monitor technicians or your monitor tech representative for your site for any technical concerns

## 2023-08-01 NOTE — Telephone Encounter (Signed)
 Called relayed Dr. Mallory Shirk message. Pt verbalized understanding, she will remove the monitor tonight.

## 2023-08-10 DIAGNOSIS — R42 Dizziness and giddiness: Secondary | ICD-10-CM | POA: Diagnosis not present

## 2023-08-20 DIAGNOSIS — G4733 Obstructive sleep apnea (adult) (pediatric): Secondary | ICD-10-CM | POA: Diagnosis not present

## 2023-08-20 DIAGNOSIS — R42 Dizziness and giddiness: Secondary | ICD-10-CM | POA: Diagnosis not present

## 2023-08-21 ENCOUNTER — Encounter: Payer: Self-pay | Admitting: Neurology

## 2023-08-21 ENCOUNTER — Ambulatory Visit
Admission: RE | Admit: 2023-08-21 | Discharge: 2023-08-21 | Disposition: A | Discharge status: Home / Self Care | Source: Ambulatory Visit | Attending: Neurology | Admitting: Neurology

## 2023-08-21 DIAGNOSIS — G43109 Migraine with aura, not intractable, without status migrainosus: Secondary | ICD-10-CM | POA: Diagnosis not present

## 2023-08-21 DIAGNOSIS — S065XAA Traumatic subdural hemorrhage with loss of consciousness status unknown, initial encounter: Secondary | ICD-10-CM

## 2023-08-21 DIAGNOSIS — F0781 Postconcussional syndrome: Secondary | ICD-10-CM | POA: Diagnosis not present

## 2023-08-21 DIAGNOSIS — G43011 Migraine without aura, intractable, with status migrainosus: Secondary | ICD-10-CM

## 2023-08-21 MED ORDER — GADOPICLENOL 0.5 MMOL/ML IV SOLN
7.5000 mL | Freq: Once | INTRAVENOUS | Status: AC | PRN
Start: 2023-08-21 — End: 2023-08-21
  Administered 2023-08-21: 7.5 mL via INTRAVENOUS

## 2023-08-22 NOTE — Telephone Encounter (Signed)
 Called the patient and reviewed the MRA brain and MRI brain for the pt. There was nothing that appeared concerning on either study. Pt verbalized understanding. Pt had no questions at this time but was encouraged to call back if questions arise.

## 2023-08-22 NOTE — Telephone Encounter (Signed)
-----   Message from Wilton Dohmeier sent at 08/21/2023  7:07 PM EDT ----- Unremarkable MR angiogram study of the brain showing no significant narrowing of the large and medium size intracranial vessels.

## 2023-09-12 ENCOUNTER — Encounter: Payer: Self-pay | Admitting: Student

## 2023-09-14 DIAGNOSIS — F439 Reaction to severe stress, unspecified: Secondary | ICD-10-CM | POA: Diagnosis not present

## 2023-09-14 DIAGNOSIS — F5101 Primary insomnia: Secondary | ICD-10-CM | POA: Diagnosis not present

## 2023-09-14 DIAGNOSIS — F419 Anxiety disorder, unspecified: Secondary | ICD-10-CM | POA: Diagnosis not present

## 2023-09-18 ENCOUNTER — Telehealth: Payer: Self-pay | Admitting: Pharmacist

## 2023-09-18 NOTE — Telephone Encounter (Signed)
 Pt requesting to speak with pharmacist. Please advise

## 2023-09-18 NOTE — Telephone Encounter (Signed)
 Patient received faulty Repatha  pen. LVM to [employer] to get replacement. Called [employer] they have not received the request yet.

## 2023-09-19 ENCOUNTER — Other Ambulatory Visit: Payer: Self-pay | Admitting: Pharmacist Clinician (PhC)/ Clinical Pharmacy Specialist

## 2023-09-19 DIAGNOSIS — G4733 Obstructive sleep apnea (adult) (pediatric): Secondary | ICD-10-CM | POA: Diagnosis not present

## 2023-09-19 DIAGNOSIS — E782 Mixed hyperlipidemia: Secondary | ICD-10-CM

## 2023-09-19 DIAGNOSIS — I2510 Atherosclerotic heart disease of native coronary artery without angina pectoris: Secondary | ICD-10-CM

## 2023-09-19 MED ORDER — REPATHA SURECLICK 140 MG/ML ~~LOC~~ SOAJ: SUBCUTANEOUS | 11 refills | Status: DC

## 2023-09-21 DIAGNOSIS — F419 Anxiety disorder, unspecified: Secondary | ICD-10-CM | POA: Diagnosis not present

## 2023-09-21 DIAGNOSIS — F5101 Primary insomnia: Secondary | ICD-10-CM | POA: Diagnosis not present

## 2023-09-22 ENCOUNTER — Encounter: Payer: Self-pay | Admitting: Internal Medicine

## 2023-09-22 NOTE — Telephone Encounter (Signed)
 Spoke to [facility], gave verbal order to replace 1 defective Repatha  Latex free pen. Called pt and informed.

## 2023-09-27 DIAGNOSIS — G4733 Obstructive sleep apnea (adult) (pediatric): Secondary | ICD-10-CM | POA: Diagnosis not present

## 2023-09-30 DIAGNOSIS — F4323 Adjustment disorder with mixed anxiety and depressed mood: Secondary | ICD-10-CM | POA: Diagnosis not present

## 2023-10-02 NOTE — Telephone Encounter (Signed)
 Sent to Pharmacy  team for assistance  for the patient

## 2023-10-02 NOTE — Telephone Encounter (Signed)
 Pt states that rec'vd box to return defective pen and has yet to receive replacement pen. She said that she did not receive her injection the whole month of May. Requesting cb

## 2023-10-05 DIAGNOSIS — I10 Essential (primary) hypertension: Secondary | ICD-10-CM | POA: Diagnosis not present

## 2023-10-05 DIAGNOSIS — E785 Hyperlipidemia, unspecified: Secondary | ICD-10-CM | POA: Diagnosis not present

## 2023-10-05 DIAGNOSIS — E041 Nontoxic single thyroid nodule: Secondary | ICD-10-CM | POA: Diagnosis not present

## 2023-10-05 DIAGNOSIS — E1169 Type 2 diabetes mellitus with other specified complication: Secondary | ICD-10-CM | POA: Diagnosis not present

## 2023-10-06 NOTE — Telephone Encounter (Signed)
 Previous case was closed bc patient didn't answer phone to set up delivery Called and gave verbal

## 2023-10-10 NOTE — Telephone Encounter (Signed)
 Patient verbalized understanding. Patient states she received a text from [employer] - they  text-- patient to let her know to let her set up delivery   Patient states she will call the company

## 2023-10-20 DIAGNOSIS — G4733 Obstructive sleep apnea (adult) (pediatric): Secondary | ICD-10-CM | POA: Diagnosis not present

## 2023-10-30 DIAGNOSIS — F4323 Adjustment disorder with mixed anxiety and depressed mood: Secondary | ICD-10-CM | POA: Diagnosis not present

## 2023-10-31 ENCOUNTER — Other Ambulatory Visit: Payer: Self-pay | Admitting: Neurology

## 2023-10-31 ENCOUNTER — Telehealth: Payer: Self-pay | Admitting: Neurology

## 2023-10-31 DIAGNOSIS — G4733 Obstructive sleep apnea (adult) (pediatric): Secondary | ICD-10-CM | POA: Diagnosis not present

## 2023-10-31 MED ORDER — EMGALITY 120 MG/ML ~~LOC~~ SOAJ: 120.0000 mg | SUBCUTANEOUS | 5 refills | Status: DC

## 2023-10-31 NOTE — Telephone Encounter (Signed)
 Refill sent.

## 2023-10-31 NOTE — Telephone Encounter (Signed)
 Pt is requesting a refill for Galcanezumab -gnlm (EMGALITY ) 120 MG/ML SOAJ.  Pharmacy: [facility] [phone]

## 2023-11-10 DIAGNOSIS — F419 Anxiety disorder, unspecified: Secondary | ICD-10-CM | POA: Diagnosis not present

## 2023-11-10 DIAGNOSIS — I10 Essential (primary) hypertension: Secondary | ICD-10-CM | POA: Diagnosis not present

## 2023-11-10 DIAGNOSIS — M79644 Pain in right finger(s): Secondary | ICD-10-CM | POA: Diagnosis not present

## 2023-11-19 DIAGNOSIS — G4733 Obstructive sleep apnea (adult) (pediatric): Secondary | ICD-10-CM | POA: Diagnosis not present

## 2023-11-27 ENCOUNTER — Telehealth: Payer: Self-pay | Admitting: Cardiology

## 2023-11-27 ENCOUNTER — Telehealth: Payer: Self-pay | Admitting: Neurology

## 2023-11-27 DIAGNOSIS — I2510 Atherosclerotic heart disease of native coronary artery without angina pectoris: Secondary | ICD-10-CM

## 2023-11-27 DIAGNOSIS — E782 Mixed hyperlipidemia: Secondary | ICD-10-CM

## 2023-11-27 NOTE — Telephone Encounter (Signed)
*  STAT* If patient is at the pharmacy, call can be transferred to refill team.   1. Which medications need to be refilled? (please list name of each medication and dose if known)   Evolocumab  (REPATHA  SURECLICK) 140 MG/ML SOAJ   NEW PHARMACY   4. Which pharmacy/location (including street and city if local pharmacy) is medication to be sent to?  [id number] [place], [place] - [address] AT [facility] [address] [facility] [address] Phone: [phone]  Fax: [phone]       5. Do they need a 30 day or 90 day supply? 90   Pt states she is completely out

## 2023-11-27 NOTE — Telephone Encounter (Signed)
 Pt called to get refill on medication . Pt Pharamcy recommend she follow up with MD  amitriptyline  (ELAVIL ) 25 MG tablet   Galcanezumab -gnlm (EMGALITY ) 120 MG/ML SOAJ   Pt would like medication    [id number] - [place], [place] - [address] AT [facility] OF [address] (Ph: [phone])

## 2023-11-28 MED ORDER — REPATHA SURECLICK 140 MG/ML ~~LOC~~ SOAJ: SUBCUTANEOUS | 11 refills | Status: AC

## 2023-11-29 MED ORDER — EMGALITY 120 MG/ML ~~LOC~~ SOAJ: 120.0000 mg | SUBCUTANEOUS | 5 refills | Status: DC

## 2023-11-29 MED ORDER — AMITRIPTYLINE HCL 25 MG PO TABS: 25.0000 mg | ORAL_TABLET | Freq: Every evening | ORAL | 3 refills | Status: DC | PRN

## 2023-11-29 NOTE — Telephone Encounter (Signed)
Refilled- please let pt know

## 2023-11-30 DIAGNOSIS — F4323 Adjustment disorder with mixed anxiety and depressed mood: Secondary | ICD-10-CM | POA: Diagnosis not present

## 2023-12-18 ENCOUNTER — Other Ambulatory Visit: Payer: Self-pay | Admitting: Cardiology

## 2023-12-18 MED ORDER — EZETIMIBE 10 MG PO TABS: ORAL_TABLET | ORAL | 1 refills | Status: AC

## 2023-12-20 DIAGNOSIS — G4733 Obstructive sleep apnea (adult) (pediatric): Secondary | ICD-10-CM | POA: Diagnosis not present

## 2024-02-12 DIAGNOSIS — E1169 Type 2 diabetes mellitus with other specified complication: Secondary | ICD-10-CM | POA: Diagnosis not present

## 2024-02-12 DIAGNOSIS — E785 Hyperlipidemia, unspecified: Secondary | ICD-10-CM | POA: Diagnosis not present

## 2024-02-12 DIAGNOSIS — I10 Essential (primary) hypertension: Secondary | ICD-10-CM | POA: Diagnosis not present

## 2024-02-12 DIAGNOSIS — G720 Drug-induced myopathy: Secondary | ICD-10-CM | POA: Diagnosis not present

## 2024-02-12 DIAGNOSIS — I5032 Chronic diastolic (congestive) heart failure: Secondary | ICD-10-CM | POA: Diagnosis not present

## 2024-02-12 DIAGNOSIS — F419 Anxiety disorder, unspecified: Secondary | ICD-10-CM | POA: Diagnosis not present

## 2024-02-12 DIAGNOSIS — F5101 Primary insomnia: Secondary | ICD-10-CM | POA: Diagnosis not present

## 2024-02-12 DIAGNOSIS — I2510 Atherosclerotic heart disease of native coronary artery without angina pectoris: Secondary | ICD-10-CM | POA: Diagnosis not present

## 2024-02-12 DIAGNOSIS — I422 Other hypertrophic cardiomyopathy: Secondary | ICD-10-CM | POA: Diagnosis not present

## 2024-03-12 ENCOUNTER — Other Ambulatory Visit: Payer: Self-pay | Admitting: Neurology

## 2024-03-15 ENCOUNTER — Ambulatory Visit (LOCAL_COMMUNITY_HEALTH_CENTER)

## 2024-03-15 DIAGNOSIS — Z23 Encounter for immunization: Secondary | ICD-10-CM | POA: Diagnosis not present

## 2024-03-15 DIAGNOSIS — Z719 Counseling, unspecified: Secondary | ICD-10-CM

## 2024-03-15 NOTE — Progress Notes (Signed)
 In nurse clinic requesting covid Moderna Spikevax 2025-26 (86yrs+). Vaccine given and tolerated well. Updated [facility] copy given and reviewed. Declines to stay for observation d/t an upcoming appt. Emylie Amster, RN

## 2024-03-31 ENCOUNTER — Other Ambulatory Visit: Payer: Self-pay | Admitting: Cardiology

## 2024-04-08 DIAGNOSIS — E538 Deficiency of other specified B group vitamins: Secondary | ICD-10-CM | POA: Diagnosis not present

## 2024-04-08 DIAGNOSIS — E041 Nontoxic single thyroid nodule: Secondary | ICD-10-CM | POA: Diagnosis not present

## 2024-04-08 DIAGNOSIS — I10 Essential (primary) hypertension: Secondary | ICD-10-CM | POA: Diagnosis not present

## 2024-04-08 DIAGNOSIS — I5032 Chronic diastolic (congestive) heart failure: Secondary | ICD-10-CM | POA: Diagnosis not present

## 2024-04-08 DIAGNOSIS — I2510 Atherosclerotic heart disease of native coronary artery without angina pectoris: Secondary | ICD-10-CM | POA: Diagnosis not present

## 2024-04-08 DIAGNOSIS — B3731 Acute candidiasis of vulva and vagina: Secondary | ICD-10-CM | POA: Diagnosis not present

## 2024-04-08 DIAGNOSIS — E785 Hyperlipidemia, unspecified: Secondary | ICD-10-CM | POA: Diagnosis not present

## 2024-04-08 DIAGNOSIS — E559 Vitamin D deficiency, unspecified: Secondary | ICD-10-CM | POA: Diagnosis not present

## 2024-04-08 DIAGNOSIS — E1169 Type 2 diabetes mellitus with other specified complication: Secondary | ICD-10-CM | POA: Diagnosis not present

## 2024-05-22 ENCOUNTER — Other Ambulatory Visit: Payer: Self-pay | Admitting: Neurology

## 2024-05-24 NOTE — Telephone Encounter (Signed)
 Pt has called to f/u on the refill request from 01-21 for her Galcanezumab -gnlm (EMGALITY ) 120 MG/ML SOAJ to [facility] #[id number]

## 2024-07-08 ENCOUNTER — Ambulatory Visit: Admitting: Adult Health
